# Patient Record
Sex: Female | Born: 1945 | Race: White | Hispanic: No | Marital: Married
Health system: Southern US, Academic
[De-identification: ages and names within clinical notes are randomized; demographics above are authoritative.]

## PROBLEM LIST (undated history)

## (undated) DIAGNOSIS — K5790 Diverticulosis of intestine, part unspecified, without perforation or abscess without bleeding: Secondary | ICD-10-CM

## (undated) DIAGNOSIS — Z87442 Personal history of urinary calculi: Secondary | ICD-10-CM

## (undated) DIAGNOSIS — I209 Angina pectoris, unspecified: Secondary | ICD-10-CM

## (undated) DIAGNOSIS — F419 Anxiety disorder, unspecified: Secondary | ICD-10-CM

## (undated) DIAGNOSIS — I10 Essential (primary) hypertension: Secondary | ICD-10-CM

## (undated) DIAGNOSIS — Z87440 Personal history of urinary (tract) infections: Secondary | ICD-10-CM

## (undated) DIAGNOSIS — G610 Guillain-Barre syndrome: Secondary | ICD-10-CM

## (undated) DIAGNOSIS — D126 Benign neoplasm of colon, unspecified: Secondary | ICD-10-CM

## (undated) DIAGNOSIS — K589 Irritable bowel syndrome without diarrhea: Secondary | ICD-10-CM

## (undated) DIAGNOSIS — M1990 Unspecified osteoarthritis, unspecified site: Secondary | ICD-10-CM

## (undated) DIAGNOSIS — E785 Hyperlipidemia, unspecified: Secondary | ICD-10-CM

## (undated) DIAGNOSIS — R7611 Nonspecific reaction to tuberculin skin test without active tuberculosis: Secondary | ICD-10-CM

## (undated) DIAGNOSIS — L409 Psoriasis, unspecified: Secondary | ICD-10-CM

## (undated) HISTORY — DX: Diverticulosis of intestine, part unspecified, without perforation or abscess without bleeding: K57.90

## (undated) HISTORY — DX: Unspecified osteoarthritis, unspecified site: M19.90

## (undated) HISTORY — PX: ABDOMINAL HYSTERECTOMY: SUR658

## (undated) HISTORY — PX: ABDOMINAL HYSTERECTOMY: SHX81

## (undated) HISTORY — DX: Psoriasis, unspecified: L40.9

## (undated) HISTORY — DX: Personal history of urinary calculi: Z87.442

## (undated) HISTORY — DX: Irritable bowel syndrome, unspecified: K58.9

## (undated) HISTORY — DX: Benign neoplasm of colon, unspecified: D12.6

## (undated) HISTORY — DX: Essential (primary) hypertension: I10

## (undated) HISTORY — PX: CATARACT EXTRACTION: SUR2

## (undated) HISTORY — PX: APPENDECTOMY: SHX54

## (undated) HISTORY — DX: Personal history of urinary (tract) infections: Z87.440

## (undated) HISTORY — PX: CHOLECYSTECTOMY: SHX55

## (undated) HISTORY — DX: Hyperlipidemia, unspecified: E78.5

## (undated) HISTORY — DX: Nonspecific reaction to tuberculin skin test without active tuberculosis: R76.11

## (undated) HISTORY — PX: EYE SURGERY: SHX253

## (undated) HISTORY — PX: SPINE SURGERY: SHX786

## (undated) HISTORY — DX: Guillain-Barre syndrome: G61.0

---
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
## 1988-11-18 HISTORY — PX: BREAST BIOPSY: SHX20

## 2005-08-18 HISTORY — PX: ROTATOR CUFF REPAIR: SHX139

## 2005-12-02 ENCOUNTER — Inpatient Hospital Stay: Payer: Self-pay | Admitting: Internal Medicine

## 2005-12-02 ENCOUNTER — Other Ambulatory Visit: Payer: Self-pay

## 2006-02-25 ENCOUNTER — Ambulatory Visit: Payer: Self-pay | Admitting: Internal Medicine

## 2006-08-10 ENCOUNTER — Emergency Department: Payer: Self-pay | Admitting: Emergency Medicine

## 2007-03-31 ENCOUNTER — Ambulatory Visit: Payer: Self-pay | Admitting: Internal Medicine

## 2007-04-10 ENCOUNTER — Ambulatory Visit: Payer: Self-pay | Admitting: Urology

## 2007-05-13 ENCOUNTER — Ambulatory Visit: Payer: Self-pay | Admitting: Urology

## 2007-05-19 ENCOUNTER — Other Ambulatory Visit: Payer: Self-pay

## 2007-05-19 ENCOUNTER — Ambulatory Visit: Payer: Self-pay | Admitting: Urology

## 2007-06-04 ENCOUNTER — Ambulatory Visit: Payer: Self-pay | Admitting: Urology

## 2007-06-15 ENCOUNTER — Ambulatory Visit: Payer: Self-pay | Admitting: Urology

## 2007-08-17 ENCOUNTER — Ambulatory Visit: Payer: Self-pay | Admitting: Gastroenterology

## 2007-09-07 ENCOUNTER — Ambulatory Visit: Payer: Self-pay | Admitting: Urology

## 2007-09-29 ENCOUNTER — Ambulatory Visit: Payer: Self-pay | Admitting: Urology

## 2008-01-07 ENCOUNTER — Ambulatory Visit: Payer: Self-pay | Admitting: Urology

## 2008-03-20 LAB — HM PAP SMEAR: HM Pap smear: NORMAL

## 2008-04-05 ENCOUNTER — Ambulatory Visit: Payer: Self-pay | Admitting: Internal Medicine

## 2008-04-15 ENCOUNTER — Ambulatory Visit: Payer: Self-pay | Admitting: Urology

## 2008-07-04 ENCOUNTER — Ambulatory Visit: Payer: Self-pay | Admitting: Urology

## 2008-08-17 ENCOUNTER — Ambulatory Visit: Payer: Self-pay | Admitting: Internal Medicine

## 2008-08-30 ENCOUNTER — Ambulatory Visit: Payer: Self-pay | Admitting: General Surgery

## 2008-09-01 ENCOUNTER — Ambulatory Visit: Payer: Self-pay | Admitting: General Surgery

## 2009-01-09 ENCOUNTER — Ambulatory Visit: Payer: Self-pay | Admitting: Urology

## 2009-05-04 ENCOUNTER — Ambulatory Visit: Payer: Self-pay | Admitting: Internal Medicine

## 2009-07-10 ENCOUNTER — Ambulatory Visit: Payer: Self-pay | Admitting: Urology

## 2009-10-18 ENCOUNTER — Ambulatory Visit: Payer: Self-pay | Admitting: Urology

## 2010-03-20 LAB — HM DIABETES EYE EXAM

## 2010-04-20 ENCOUNTER — Ambulatory Visit: Payer: Self-pay | Admitting: Urology

## 2010-05-15 ENCOUNTER — Ambulatory Visit: Payer: Self-pay | Admitting: Internal Medicine

## 2010-09-05 ENCOUNTER — Ambulatory Visit: Payer: Self-pay | Admitting: Internal Medicine

## 2011-01-23 ENCOUNTER — Ambulatory Visit: Payer: Self-pay | Admitting: Urology

## 2011-02-06 ENCOUNTER — Encounter: Payer: Self-pay | Admitting: Internal Medicine

## 2011-02-12 ENCOUNTER — Ambulatory Visit: Payer: Self-pay | Admitting: Internal Medicine

## 2011-02-17 ENCOUNTER — Ambulatory Visit: Payer: Self-pay | Admitting: Internal Medicine

## 2011-02-17 ENCOUNTER — Encounter: Payer: Self-pay | Admitting: Internal Medicine

## 2011-03-19 ENCOUNTER — Ambulatory Visit: Payer: Self-pay | Admitting: Internal Medicine

## 2011-07-12 ENCOUNTER — Ambulatory Visit (INDEPENDENT_AMBULATORY_CARE_PROVIDER_SITE_OTHER): Payer: Medicare Other | Admitting: Internal Medicine

## 2011-07-12 DIAGNOSIS — D649 Anemia, unspecified: Secondary | ICD-10-CM

## 2011-07-12 MED ORDER — CYANOCOBALAMIN 1000 MCG/ML IJ SOLN
1000.0000 ug | Freq: Once | INTRAMUSCULAR | Status: AC
  Administered 2011-07-12: 1000 ug via INTRAMUSCULAR

## 2011-07-12 NOTE — Progress Notes (Signed)
Pt. Was given B-12 per/Dr. Tilman Neat ok.

## 2011-07-18 ENCOUNTER — Telehealth: Payer: Self-pay | Admitting: Internal Medicine

## 2011-07-19 ENCOUNTER — Encounter: Payer: Self-pay | Admitting: Internal Medicine

## 2011-07-19 ENCOUNTER — Ambulatory Visit (INDEPENDENT_AMBULATORY_CARE_PROVIDER_SITE_OTHER): Payer: Medicare Other | Admitting: Internal Medicine

## 2011-07-19 VITALS — BP 134/76 | HR 90 | Temp 98.0°F | Resp 14 | Ht 64.0 in | Wt 174.0 lb

## 2011-07-19 DIAGNOSIS — K5792 Diverticulitis of intestine, part unspecified, without perforation or abscess without bleeding: Secondary | ICD-10-CM

## 2011-07-19 DIAGNOSIS — G589 Mononeuropathy, unspecified: Secondary | ICD-10-CM

## 2011-07-19 DIAGNOSIS — G629 Polyneuropathy, unspecified: Secondary | ICD-10-CM | POA: Insufficient documentation

## 2011-07-19 DIAGNOSIS — K5732 Diverticulitis of large intestine without perforation or abscess without bleeding: Secondary | ICD-10-CM

## 2011-07-19 DIAGNOSIS — E785 Hyperlipidemia, unspecified: Secondary | ICD-10-CM

## 2011-07-19 DIAGNOSIS — E119 Type 2 diabetes mellitus without complications: Secondary | ICD-10-CM | POA: Insufficient documentation

## 2011-07-19 DIAGNOSIS — Z0000 Encounter for general adult medical examination without abnormal findings: Secondary | ICD-10-CM

## 2011-07-19 MED ORDER — CIPROFLOXACIN HCL 500 MG PO TABS: 500.0000 mg | ORAL_TABLET | Freq: Two times a day (BID) | ORAL | Status: AC

## 2011-07-19 MED ORDER — GABAPENTIN 300 MG PO CAPS: 600.0000 mg | ORAL_CAPSULE | Freq: Every day | ORAL | Status: DC

## 2011-07-19 MED ORDER — METRONIDAZOLE 500 MG PO TABS: 500.0000 mg | ORAL_TABLET | Freq: Three times a day (TID) | ORAL | Status: AC

## 2011-07-19 MED ORDER — HYDROCODONE-ACETAMINOPHEN 5-325 MG PO TABS: 1.0000 | ORAL_TABLET | ORAL | Status: AC | PRN

## 2011-07-19 NOTE — Patient Instructions (Signed)
Diverticulitis A diverticulum is a small pouch or sac on the colon. Diverticulosis is the presence of these diverticula on the colon. Diverticulitis is the irritation (inflammation) or infection of diverticula. CAUSES The colon and its diverticula contain germs (bacteria). If food particles block the tiny opening to a diverticulum, the bacteria inside can grow and cause an increase in pressure. This leads to infection and inflammation. SYMPTOMS  Belly (abdominal) pain and tenderness. Usually, the pain is located on the left side of your abdomen. However, it could be located elsewhere.   Fever.   Bloating.   Feeling sick to your stomach (nausea).   Throwing up (vomiting).   Abnormal stools.  DIAGNOSIS Your caregiver will take a history and perform a physical exam. Since many things can cause abdominal pain, other tests may be necessary. Tests may include:  Blood tests.  Urine tests.   X-ray of the abdomen.  CT scan of the abdomen.   Sometimes, surgery is needed to determine if diverticulitis or other conditions are causing your symptoms. TREATMENT Most of the time, you can be treated without surgery. Treatment includes:  Resting the bowels by only having liquids for a few days.   Intravenous (IV) fluids if you are losing fluids (dehydrated).   Medicines (antibiotics) that kill germs may be given.   Pain and nausea medicine, if needed.   As you improve, eating soft, easily digestible foods.   Surgery if the inflamed diverticulum has burst.  HOME CARE INSTRUCTIONS  Take all medicine as directed by your caregiver.   Try a clear liquid diet (broth, tea, or water, or as directed by your caregiver). You may then gradually begin a bland diet as tolerated.   You may be put on a high-fiber diet. Avoid nuts and seeds. Follow your caregiver's diet recommendations.   If your caregiver has given you a follow-up appointment, it is very important that you go. Not going could result  in lasting (chronic) or permanent injury, pain, and disability. If there is any problem keeping the appointment, call to reschedule.  SEEK MEDICAL CARE IF:  Pain does not improve.   You have a hard time advancing your diet beyond clear liquids.   Bowel movements do not return to normal.  SEEK IMMEDIATE MEDICAL CARE IF:  The pain becomes worse.   You have an oral temperature above 101.5, not controlled by medicine.   Repeated vomiting occurs.   You have bloody or black, tarry stools.   Symptoms that brought you to your caregiver become worse or are not getting better.  MAKE SURE YOU:  Understand these instructions.   Will watch your condition.   Will get help right away if you are not doing well or get worse.  Document Released: 08/14/2005 Document Re-Released: 01/29/2010 [facility] Patient Information 2011 [place], [place].

## 2011-07-19 NOTE — Progress Notes (Signed)
Subjective:    Patient ID: Caroline Gray, female    DOB: 01/05/1946, 65 y.o.   MRN: [id number]  Abdominal Pain This is a new problem. The current episode started yesterday. The onset quality is sudden. The problem occurs constantly. The problem has been gradually improving. The pain is located in the LLQ. The pain is severe. The quality of the pain is aching and burning. The abdominal pain does not radiate. Associated symptoms include nausea. Pertinent negatives include no belching, constipation, diarrhea, dysuria, fever, frequency, hematochezia, melena or vomiting. The pain is aggravated by nothing. The pain is relieved by nothing. Treatments tried: ibuprofen.  Patient with h/o diverticultis.   Outpatient Encounter Prescriptions as of 07/19/2011  Medication Sig Dispense Refill  . adalimumab (HUMIRA) 40 MG/0.8ML injection Inject 40 mg into the skin once a week.        . gabapentin (NEURONTIN) 300 MG capsule Take 2 capsules (600 mg total) by mouth daily.  60 capsule  11  . DISCONTD: gabapentin (NEURONTIN) 100 MG capsule Take 100 mg by mouth daily.        . ciprofloxacin (CIPRO) 500 MG tablet Take 1 tablet (500 mg total) by mouth 2 (two) times daily.  28 tablet  0  . glipiZIDE (GLUCOTROL XL) 2.5 MG 24 hr tablet       . HYDROcodone-acetaminophen (NORCO) 5-325 MG per tablet Take 1 tablet by mouth every 4 (four) hours as needed for pain.  30 tablet  0  . metFORMIN (GLUCOPHAGE) 1000 MG tablet       . metroNIDAZOLE (FLAGYL) 500 MG tablet Take 1 tablet (500 mg total) by mouth 3 (three) times daily.  42 tablet  0  . ONE TOUCH ULTRA TEST test strip       . simvastatin (ZOCOR) 40 MG tablet         Review of Systems  Constitutional: Positive for chills, appetite change and fatigue. Negative for fever.  Respiratory: Negative for shortness of breath.   Cardiovascular: Negative for chest pain and leg swelling.  Gastrointestinal: Positive for nausea, abdominal pain and abdominal distention. Negative for  vomiting, diarrhea, constipation, blood in stool, melena, hematochezia, anal bleeding and rectal pain.  Genitourinary: Negative for dysuria and frequency.    BP 134/76  Pulse 90  Temp(Src) 98 F (36.7 C) (Oral)  Resp 14  Ht 5\' 4"  (1.626 m)  Wt 174 lb (78.926 kg)  BMI 29.87 kg/m2  SpO2 93%     Objective:   Physical Exam  Constitutional: She is oriented to person, place, and time. She appears well-developed and well-nourished. No distress.  HENT:  Head: Normocephalic and atraumatic.  Right Ear: External ear normal.  Left Ear: External ear normal.  Nose: Nose normal.  Eyes: Conjunctivae are normal. Pupils are equal, round, and reactive to light. Right eye exhibits no discharge. Left eye exhibits no discharge. No scleral icterus.  Neck: Trachea normal and normal range of motion. Neck supple. Carotid bruit is not present. No tracheal deviation present. No thyromegaly present.  Cardiovascular: Normal rate, regular rhythm, normal heart sounds, intact distal pulses and normal pulses.  Exam reveals no gallop and no friction rub.   No murmur heard. Pulmonary/Chest: Effort normal and breath sounds normal. No respiratory distress. She has no wheezes. She has no rales. She exhibits no tenderness.  Abdominal: Soft. Normal appearance and bowel sounds are normal. There is no hepatosplenomegaly. There is tenderness in the left lower quadrant. There is no rigidity, no rebound and no guarding.  Musculoskeletal: Normal range of motion. She exhibits no edema and no tenderness.  Lymphadenopathy:    She has no cervical adenopathy.  Neurological: She is alert and oriented to person, place, and time. No cranial nerve deficit. She exhibits normal muscle tone. Coordination normal.  Skin: Skin is warm and dry. No rash noted. She is not diaphoretic. No erythema. No pallor.  Psychiatric: She has a normal mood and affect. Her behavior is normal. Judgment and thought content normal.          Assessment &  Plan:  1. Diverticulitis - patient with a history of diverticulitis.Presents with symptoms of left lower quadrant pain which are consistent with previous episodes.  Denies any hematochezia. Will treat empirically with Cipro and Flagyl x2 weeks. She has been taking ibuprofen for pain and I am reluctant for her to continue this because of the risk of upper GI bleeding. We will try hydrocodone. She has had an allergy to codeine in the past with nausea. However, she reports tolerating hydrocodone. Should she have any problems with hydrocodone she will call our office and I will consider a low dose of Dilaudid. Should her pain not improve over the next few days or should her pain worsen or she develops new symptoms she will return to clinic or call immediately. Otherwise she will return to clinic for her yearly physical exam next month.

## 2011-08-09 ENCOUNTER — Telehealth: Payer: Self-pay | Admitting: *Deleted

## 2011-08-09 ENCOUNTER — Other Ambulatory Visit (INDEPENDENT_AMBULATORY_CARE_PROVIDER_SITE_OTHER): Payer: Medicare Other | Admitting: *Deleted

## 2011-08-09 ENCOUNTER — Encounter: Payer: Self-pay | Admitting: Internal Medicine

## 2011-08-09 DIAGNOSIS — E119 Type 2 diabetes mellitus without complications: Secondary | ICD-10-CM

## 2011-08-09 DIAGNOSIS — G589 Mononeuropathy, unspecified: Secondary | ICD-10-CM

## 2011-08-09 DIAGNOSIS — G629 Polyneuropathy, unspecified: Secondary | ICD-10-CM

## 2011-08-09 DIAGNOSIS — E785 Hyperlipidemia, unspecified: Secondary | ICD-10-CM

## 2011-08-09 LAB — MICROALBUMIN / CREATININE URINE RATIO: Microalb Creat Ratio: 12.7 mg/g (ref 0.0–30.0)

## 2011-08-09 LAB — LIPID PANEL
Cholesterol: 174 mg/dL (ref 0–200)
HDL: 40.2 mg/dL (ref >39.00)
LDL Cholesterol: 98 mg/dL (ref 0–99)
Triglycerides: 181 mg/dL — ABNORMAL HIGH (ref 0.0–149.0)
VLDL: 36.2 mg/dL (ref 0.0–40.0)

## 2011-08-09 LAB — COMPREHENSIVE METABOLIC PANEL
AST: 29 U/L (ref 0–37)
Alkaline Phosphatase: 59 U/L (ref 39–117)
BUN: 15 mg/dL (ref 6–23)
Creatinine, Ser: 0.9 mg/dL (ref 0.4–1.2)
Glucose, Bld: 269 mg/dL — ABNORMAL HIGH (ref 70–99)
Total Bilirubin: 0.5 mg/dL (ref 0.3–1.2)

## 2011-08-09 NOTE — Progress Notes (Signed)
Addended by: Jobie Quaker on: 08/09/2011 02:03 PM   Modules accepted: Orders

## 2011-08-09 NOTE — Telephone Encounter (Signed)
Opened in error

## 2011-08-12 ENCOUNTER — Ambulatory Visit: Payer: BC Managed Care – PPO

## 2011-08-14 ENCOUNTER — Ambulatory Visit: Payer: BC Managed Care – PPO

## 2011-08-14 ENCOUNTER — Encounter: Payer: Self-pay | Admitting: Internal Medicine

## 2011-08-14 ENCOUNTER — Ambulatory Visit (INDEPENDENT_AMBULATORY_CARE_PROVIDER_SITE_OTHER): Payer: Medicare Other | Admitting: Internal Medicine

## 2011-08-14 DIAGNOSIS — E119 Type 2 diabetes mellitus without complications: Secondary | ICD-10-CM

## 2011-08-14 DIAGNOSIS — E538 Deficiency of other specified B group vitamins: Secondary | ICD-10-CM

## 2011-08-14 DIAGNOSIS — E785 Hyperlipidemia, unspecified: Secondary | ICD-10-CM

## 2011-08-14 LAB — GLUCOSE, POCT (MANUAL RESULT ENTRY): POC Glucose: 243

## 2011-08-14 MED ORDER — GLIPIZIDE ER 2.5 MG PO TB24: 5.0000 mg | ORAL_TABLET | Freq: Every day | ORAL | Status: DC

## 2011-08-14 MED ORDER — GLUCOSE BLOOD VI STRP: ORAL_STRIP | Status: DC

## 2011-08-14 MED ORDER — CYANOCOBALAMIN 1000 MCG/ML IJ SOLN
1000.0000 ug | Freq: Once | INTRAMUSCULAR | Status: AC
  Administered 2011-08-14: 1000 ug via INTRAMUSCULAR

## 2011-08-14 NOTE — Patient Instructions (Signed)
Record blood sugars twice daily. Return in 1 month.

## 2011-08-14 NOTE — Progress Notes (Signed)
Subjective:    Patient ID: Caroline Gray, female    DOB: [birth year], 65 y.o.   MRN: [id number]  HPI Caroline Gray is a 65 year old female with a history of diabetes, hyperlipidemia, and psoriasis who presents for followup. In regards to her diabetes she reports recently elevated blood sugars, often over 200 fasting. She notes a recent dietary indiscretions secondary to often eating on the run as she attempts to take care of her husband who has been ill. She denies any low blood sugars. She reports full compliance with her metformin and glipizide. Recent hemoglobin A1c on lab work was 7.4%. Recent fasting blood work him up work was 269.  Caroline Gray also notes recent worsening of the psoriasis on her hands and feet. She is followed by dermatologist at [facility]. She is currently taking both Humira and methotrexate. She reports significant fatigue after taking her methotrexate. She has followup with her dermatologist to assess her progress, however has been told that there are no other available treatments at this time.  In regards to her hyperlipidemia, Caroline Gray reports full compliance with her simvastatin. She denies any myalgia or other noted side effects from this medication. Review of her recent lab work shows an LDL cholesterol of 92, just above goal of less than 70.  Outpatient Encounter Prescriptions as of 08/14/2011  Medication Sig Dispense Refill  . adalimumab (HUMIRA) 40 MG/0.8ML injection Inject 40 mg into the skin once a week.        . Calcium Citrate-Vitamin D (CALCIUM CITRATE + D PO) Take 1 capsule by mouth 2 (two) times daily.        . folic acid (FOLVITE) 1 MG tablet Take 1 mg by mouth. Take only 5 days a week Tuesday through Saturday      . gabapentin (NEURONTIN) 300 MG capsule Take 2 capsules (600 mg total) by mouth daily.  60 capsule  11  . glucose blood (ONE TOUCH ULTRA TEST) test strip Check blood sugar twice daily  100 each  11  . LOCOID LIPOCREAM 0.1 % CREA       . metFORMIN (GLUCOPHAGE) 1000 MG  tablet Take 1,000 mg by mouth at bedtime.       . methotrexate (RHEUMATREX) 2.5 MG tablet Take 2.5 mg by mouth. Takes on Sunday 3 pills and 1 on Monday      . Multiple Vitamin (MULTIVITAMIN) capsule Take 1 capsule by mouth daily.        . urea (CARMOL) 40 % CREA       . DISCONTD: ONE TOUCH ULTRA TEST test strip       . glipiZIDE (GLUCOTROL XL) 2.5 MG 24 hr tablet Take 2 tablets (5 mg total) by mouth daily.  60 tablet  11  . simvastatin (ZOCOR) 40 MG tablet Take 40 mg by mouth at bedtime.          Review of Systems  Constitutional: Negative for fever, chills, appetite change, fatigue and unexpected weight change.  HENT: Negative for ear pain, congestion, sore throat, trouble swallowing, neck pain, voice change and sinus pressure.   Eyes: Negative for visual disturbance.  Respiratory: Negative for cough, shortness of breath, wheezing and stridor.   Cardiovascular: Negative for chest pain, palpitations and leg swelling.  Gastrointestinal: Negative for nausea, vomiting, abdominal pain, diarrhea, constipation, blood in stool, abdominal distention and anal bleeding.  Genitourinary: Negative for dysuria and flank pain.  Musculoskeletal: Negative for myalgias, arthralgias and gait problem.  Skin: Positive for rash. Negative for color  change.  Neurological: Negative for dizziness and headaches.  Hematological: Negative for adenopathy. Does not bruise/bleed easily.  Psychiatric/Behavioral: Positive for dysphoric mood. Negative for suicidal ideas and sleep disturbance. The patient is not nervous/anxious.    BP 151/84  Pulse 88  Temp(Src) 97.7 F (36.5 C) (Oral)  Resp 16  Ht 5\' 3"  (1.6 m)  Wt 167 lb (75.751 kg)  BMI 29.58 kg/m2  SpO2 97%     Objective:   Physical Exam  Constitutional: She is oriented to person, place, and time. She appears well-developed and well-nourished. No distress.  HENT:  Head: Normocephalic and atraumatic.  Right Ear: External ear normal.  Left Ear: External ear  normal.  Nose: Nose normal.  Mouth/Throat: Oropharynx is clear and moist. No oropharyngeal exudate.  Eyes: Conjunctivae are normal. Pupils are equal, round, and reactive to light. Right eye exhibits no discharge. Left eye exhibits no discharge. No scleral icterus.  Neck: Normal range of motion. Neck supple. No tracheal deviation present. No thyromegaly present.  Cardiovascular: Normal rate, regular rhythm, normal heart sounds and intact distal pulses.  Exam reveals no gallop and no friction rub.   No murmur heard. Pulmonary/Chest: Effort normal and breath sounds normal. No respiratory distress. She has no wheezes. She has no rales. She exhibits no tenderness.  Musculoskeletal: Normal range of motion. She exhibits no edema and no tenderness.  Lymphadenopathy:    She has no cervical adenopathy.  Neurological: She is alert and oriented to person, place, and time. No cranial nerve deficit. She exhibits normal muscle tone. Coordination normal.  Skin: Skin is warm and dry. Rash noted. She is not diaphoretic. No erythema. No pallor.          Erythematous, peeling rash over palms and soles  Psychiatric: She has a normal mood and affect. Her behavior is normal. Judgment and thought content normal.          Assessment & Plan:  1. Diabetes - blood sugar has been elevated recently in the setting of dietary indiscretions. Encouraged healthy diet. Patient is reluctant to start injectable medications. We'll try increasing glipizide to 5 mg daily. We'll have her record her blood sugars twice daily return to clinic in one month.  2. Psoriasis - patient with severe psoriasis which has been resistant to treatment with Humira and methotrexate. She has followup scheduled at [facility] and will keep this. Will continue to follow.  3. Hyperlipidemia - LDL cholesterol just above goal at 92. We discussed goal LDL cholesterol being less than 70. We will plan to repeat cholesterol labs in 3-6 months. She will continue  simvastatin.  4. B12 def - B12 injection today.

## 2011-09-13 ENCOUNTER — Encounter: Payer: Self-pay | Admitting: Internal Medicine

## 2011-09-13 ENCOUNTER — Ambulatory Visit (INDEPENDENT_AMBULATORY_CARE_PROVIDER_SITE_OTHER): Payer: Medicare Other | Admitting: Internal Medicine

## 2011-09-13 VITALS — BP 131/82 | HR 75 | Temp 97.8°F | Resp 16 | Ht 63.0 in | Wt 172.0 lb

## 2011-09-13 DIAGNOSIS — R5383 Other fatigue: Secondary | ICD-10-CM

## 2011-09-13 DIAGNOSIS — E119 Type 2 diabetes mellitus without complications: Secondary | ICD-10-CM

## 2011-09-13 DIAGNOSIS — L409 Psoriasis, unspecified: Secondary | ICD-10-CM

## 2011-09-13 DIAGNOSIS — L408 Other psoriasis: Secondary | ICD-10-CM

## 2011-09-13 DIAGNOSIS — R5381 Other malaise: Secondary | ICD-10-CM

## 2011-09-13 DIAGNOSIS — Z0000 Encounter for general adult medical examination without abnormal findings: Secondary | ICD-10-CM

## 2011-09-13 MED ORDER — CYANOCOBALAMIN 1000 MCG/ML IJ SOLN
1000.0000 ug | Freq: Once | INTRAMUSCULAR | Status: AC
  Administered 2011-09-13: 1000 ug via INTRAMUSCULAR

## 2011-09-13 NOTE — Patient Instructions (Signed)
You are doing well. Continue current medications. Continue with walking and weight lifting. Follow up with labs and visit in 10/2011.

## 2011-09-13 NOTE — Progress Notes (Signed)
Addended by: Virgina Evener on: 09/13/2011 05:54 PM   Modules accepted: Orders

## 2011-09-13 NOTE — Progress Notes (Signed)
Subjective:    Caroline Gray is a 65 y.o. female who presents for Medicare Annual/Subsequent preventive examination. She reports that she has been feeling well. She brings a record of her blood sugars today which show most blood sugars between 70 and 150 fasting. She does have a few outlier sugars near 200. She reports that these elevated sugars occurred when she was traveling with friends. Over the last couple of weeks she has made significant effort to improve her diet and has been exercising on a regular basis by walking and lifting weights. She has noticed a gradual decline in her blood sugars. She continues to take glipizide and metformin. She denies any complications or side effects from these medications. She denies any symptomatic low blood sugars.  She notes that since July 2012 she has had significant worsening of her psoriasis. She has been followed closely by her dermatologist and is taking methotrexate. She also receives Humira once weekly. She was having significant abdominal pain with methotrexate and recently leucovorin was added. She reports some improvement with this. She continues to have peeling she continues to have peeling over both of her hands and her feet. She attributes the worsening in her psoriasis to increased stress associated with dealing with her husband's recent illness.  Preventive Screening-Counseling & Management  Tobacco History  Smoking status  . Never Smoker   Smokeless tobacco  . Never Used     Current Problems (verified) Patient Active Problem List  Diagnoses  . Hyperlipidemia  . Diabetes mellitus  . Neuropathy  . B12 deficiency    Medications Prior to Visit Current Outpatient Prescriptions on File Prior to Visit  Medication Sig Dispense Refill  . adalimumab (HUMIRA) 40 MG/0.8ML injection Inject 40 mg into the skin once a week.        . Calcium Citrate-Vitamin D (CALCIUM CITRATE + D PO) Take 1 capsule by mouth 2 (two) times daily.        Marland Kitchen gabapentin  (NEURONTIN) 300 MG capsule Take 2 capsules (600 mg total) by mouth daily.  60 capsule  11  . glipiZIDE (GLUCOTROL XL) 2.5 MG 24 hr tablet Take 2 tablets (5 mg total) by mouth daily.  60 tablet  11  . glucose blood (ONE TOUCH ULTRA TEST) test strip Check blood sugar twice daily  100 each  11  . LOCOID LIPOCREAM 0.1 % CREA       . metFORMIN (GLUCOPHAGE) 1000 MG tablet Take 1,000 mg by mouth at bedtime.       . methotrexate (RHEUMATREX) 2.5 MG tablet Take 2.5 mg by mouth. Takes on Sunday 3 pills and 1 on Monday      . Multiple Vitamin (MULTIVITAMIN) capsule Take 1 capsule by mouth daily.        . simvastatin (ZOCOR) 40 MG tablet Take 40 mg by mouth at bedtime.       . urea (CARMOL) 40 % CREA         Current Medications (verified) Current Outpatient Prescriptions  Medication Sig Dispense Refill  . adalimumab (HUMIRA) 40 MG/0.8ML injection Inject 40 mg into the skin once a week.        . Calcium Citrate-Vitamin D (CALCIUM CITRATE + D PO) Take 1 capsule by mouth 2 (two) times daily.        . clobetasol (TEMOVATE) 0.05 % cream Apply 1 application topically 2 (two) times daily.        Marland Kitchen gabapentin (NEURONTIN) 300 MG capsule Take 2 capsules (600 mg total)  by mouth daily.  60 capsule  11  . glipiZIDE (GLUCOTROL XL) 2.5 MG 24 hr tablet Take 2 tablets (5 mg total) by mouth daily.  60 tablet  11  . glucose blood (ONE TOUCH ULTRA TEST) test strip Check blood sugar twice daily  100 each  11  . leucovorin (WELLCOVORIN) 5 MG tablet Take 5 mg by mouth once a week.        Marland Kitchen LOCOID LIPOCREAM 0.1 % CREA       . metFORMIN (GLUCOPHAGE) 1000 MG tablet Take 1,000 mg by mouth at bedtime.       . methotrexate (RHEUMATREX) 2.5 MG tablet Take 2.5 mg by mouth. Takes on Sunday 3 pills and 1 on Monday      . Multiple Vitamin (MULTIVITAMIN) capsule Take 1 capsule by mouth daily.        . simvastatin (ZOCOR) 40 MG tablet Take 40 mg by mouth at bedtime.       . urea (CARMOL) 40 % CREA          Allergies  (verified) Codeine; Ivp dye; and Macrodantin   PAST HISTORY  Family History Family History  Problem Relation Age of Onset  . Diabetes Mother     Social History History  Substance Use Topics  . Smoking status: Never Smoker   . Smokeless tobacco: Never Used  . Alcohol Use: Yes     occassionaly     Are there smokers in your home (other than you)? No  Risk Factors Current exercise habits: walks regularly and lifts weights 2 days per week  Dietary issues discussed: Healthy in general   Cardiac risk factors: diabetes mellitus and dyslipidemia.  Depression Screen (Note: if answer to either of the following is "Yes", a more complete depression screening is indicated)   Over the past two weeks, have you felt down, depressed or hopeless? Yes, down occasionally  Over the past two weeks, have you felt little interest or pleasure in doing things? No  Have you lost interest or pleasure in daily life? No  Do you often feel hopeless? No  Do you cry easily over simple problems? No  Activities of Daily Living In your present state of health, do you have any difficulty performing the following activities?:  Driving? No Managing money?  No Feeding yourself? No Getting from bed to chair? No Climbing a flight of stairs? No Preparing food and eating?: No Bathing or showering? No Getting dressed: No Getting to the toilet? No Using the toilet:No Moving around from place to place: No In the past year have you fallen or had a near fall?:No   Are you sexually active?  Yes  Do you have more than one partner?  No  Hearing Difficulties: No Do you often ask people to speak up or repeat themselves? No Do you experience ringing or noises in your ears? Yes, brother also experiences this Do you have difficulty understanding soft or whispered voices? No   Do you feel that you have a problem with memory? No  Do you often misplace items? No  Do you feel safe at home?  Yes  Cognitive  Testing  Alert? Yes  Normal Appearance?Yes  Oriented to person? Yes  Place? Yes   Time? Yes  Recall of three objects?  Yes  Can perform simple calculations? Yes  Displays appropriate judgment?Yes  Can read the correct time from a watch face?Yes   Advanced Directives have been discussed with the patient? Yes, living will and  HCPOA in place. With lawyer Jenne Pane in [place].  List the Names of Other Physician/Practitioners you currently use: 1.  Dr. Westley Foots, Derm 2.  Dr. Achilles Dunk, Urology    There is no immunization history on file for this patient.  Screening Tests Health Maintenance  Topic Date Due  . Tetanus/tdap  02/21/1963  . Zostavax  02/21/2004  . Pneumococcal Polysaccharide Vaccine Age 97 And Over  02/20/2009  . Influenza Vaccine  08/19/2011  . Mammogram  07/18/2013  . Colonoscopy  07/18/2018    All answers were reviewed with the patient and necessary referrals were made:  Shelia Media, MD   09/13/2011   History reviewed: allergies, current medications, past family history, past medical history, past social history, past surgical history and problem list  Review of Systems Pertinent items are noted in HPI.    Objective:      Body mass index is 30.47 kg/(m^2). BP 131/82  Pulse 75  Temp(Src) 97.8 F (36.6 C) (Oral)  Resp 16  Ht 5\' 3"  (1.6 m)  Wt 172 lb (78.019 kg)  BMI 30.47 kg/m2  SpO2 97%  BP 131/82  Pulse 75  Temp(Src) 97.8 F (36.6 C) (Oral)  Resp 16  Ht 5\' 3"  (1.6 m)  Wt 172 lb (78.019 kg)  BMI 30.47 kg/m2  SpO2 97%  General Appearance:    Alert, cooperative, no distress, appears stated age  Head:    Normocephalic, without obvious abnormality, atraumatic  Eyes:    PERRL, conjunctiva/corneas clear, EOM's intact, fundi    benign, both eyes  Ears:    Normal TM's and external ear canals, both ears  Nose:   Nares normal, septum midline, mucosa normal, no drainage    or sinus tenderness  Throat:   Lips, mucosa, and tongue normal; teeth and gums  normal  Neck:   Supple, symmetrical, trachea midline, no adenopathy;    thyroid:  no enlargement/tenderness/nodules; no carotid   bruit or JVD  Back:     Symmetric, no curvature, ROM normal, no CVA tenderness  Lungs:     Clear to auscultation bilaterally, respirations unlabored  Chest Wall:    No tenderness or deformity   Heart:    Regular rate and rhythm, S1 and S2 normal, no murmur, rub   or gallop  Breast Exam:    No tenderness, masses, or nipple abnormality  Abdomen:     Soft, non-tender, bowel sounds active all four quadrants,    no masses, no organomegaly  Extremities:   Extremities normal, atraumatic, no cyanosis or edema  Pulses:   2+ and symmetric all extremities  Skin:   Peeling over palmar surfaces of hands and plantar surface of feet  Lymph nodes:   Cervical, supraclavicular, and axillary nodes normal  Neurologic:   CNII-XII intact, normal strength, sensation and reflexes    throughout       Assessment:     1. General Exam - patients general exam today was normal. We discussed the pros and cons of continuing with Pap smears and she has decided not to have additional Pap smears. She reports that all Pap smears in the past have been normal. She is scheduled for a mammogram. She is up-to-date on her colonoscopy. She is up-to-date on lab work including CBC, CMP, and lipid profile. Encourage continued healthy diet and regular exercise with walking and lifting weights. 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  2. Diabetes Mellitus - blood sugars have improved over the  last couple of weeks with improved diet and increased physical activity. Encouraged her to continue with this. She will continue to record her blood sugars. She will have her hemoglobin A1c rechecked in December 2012. She will followup at that time.  3. Psoriasis -recently worsened in the setting of increased stress. She notes improvement in side effects from methotrexate with the use of  leucovorin. She will continue to follow with dermatology.  4. Pernicious anemia - h/o B12 deficiency. Will give B12 shot today.      Diet review for nutrition referral?  Not Indicated    Patient Instructions (the written plan) was given to the patient.  Medicare Attestation I have personally reviewed: The patient's medical and social history Their use of alcohol, tobacco or illicit drugs Their current medications and supplements The patient's functional ability including ADLs,fall risks, home safety risks, cognitive, and hearing and visual impairment Diet and physical activities Evidence for depression or mood disorders  The patient's weight, height, BMI, and visual acuity have been recorded in the chart.  I have made referrals, counseling, and provided education to the patient based on review of the above and I have provided the patient with a written personalized care plan for preventive services.     Shelia Media, MD   09/13/2011

## 2011-09-21 LAB — HM MAMMOGRAPHY: HM Mammogram: NORMAL

## 2011-09-23 ENCOUNTER — Ambulatory Visit: Payer: Self-pay | Admitting: Internal Medicine

## 2011-09-30 NOTE — Telephone Encounter (Signed)
This was a FYI.

## 2011-10-08 ENCOUNTER — Encounter: Payer: Self-pay | Admitting: Internal Medicine

## 2011-10-14 ENCOUNTER — Ambulatory Visit: Payer: Medicare Other | Admitting: *Deleted

## 2011-10-14 DIAGNOSIS — E538 Deficiency of other specified B group vitamins: Secondary | ICD-10-CM

## 2011-10-14 MED ORDER — CYANOCOBALAMIN 1000 MCG/ML IJ SOLN
1000.0000 ug | Freq: Once | INTRAMUSCULAR | Status: AC
  Administered 2011-10-14: 1000 ug via INTRAMUSCULAR

## 2011-11-21 ENCOUNTER — Other Ambulatory Visit (INDEPENDENT_AMBULATORY_CARE_PROVIDER_SITE_OTHER): Payer: Medicare Other | Admitting: *Deleted

## 2011-11-21 ENCOUNTER — Ambulatory Visit: Payer: Medicare Other

## 2011-11-21 DIAGNOSIS — E538 Deficiency of other specified B group vitamins: Secondary | ICD-10-CM

## 2011-11-21 DIAGNOSIS — E119 Type 2 diabetes mellitus without complications: Secondary | ICD-10-CM

## 2011-11-21 LAB — COMPREHENSIVE METABOLIC PANEL
Albumin: 3.9 g/dL (ref 3.5–5.2)
Alkaline Phosphatase: 59 U/L (ref 39–117)
BUN: 15 mg/dL (ref 6–23)
Calcium: 9.3 mg/dL (ref 8.4–10.5)
Creatinine, Ser: 0.9 mg/dL (ref 0.4–1.2)
Glucose, Bld: 135 mg/dL — ABNORMAL HIGH (ref 70–99)
Potassium: 3.9 mEq/L (ref 3.5–5.1)

## 2011-11-21 MED ORDER — CYANOCOBALAMIN 1000 MCG/ML IJ SOLN
1000.0000 ug | Freq: Once | INTRAMUSCULAR | Status: AC
Start: 2011-11-21 — End: 2011-11-21
  Administered 2011-11-21: 1000 ug via INTRAMUSCULAR

## 2011-11-26 ENCOUNTER — Ambulatory Visit: Payer: Medicare Other | Admitting: Internal Medicine

## 2011-12-03 ENCOUNTER — Encounter: Payer: Self-pay | Admitting: Internal Medicine

## 2011-12-03 ENCOUNTER — Ambulatory Visit (INDEPENDENT_AMBULATORY_CARE_PROVIDER_SITE_OTHER): Payer: Medicare Other | Admitting: Internal Medicine

## 2011-12-03 VITALS — BP 130/62 | Temp 97.7°F | Ht 63.0 in | Wt 167.0 lb

## 2011-12-03 DIAGNOSIS — E119 Type 2 diabetes mellitus without complications: Secondary | ICD-10-CM

## 2011-12-03 DIAGNOSIS — R1032 Left lower quadrant pain: Secondary | ICD-10-CM

## 2011-12-03 DIAGNOSIS — K5792 Diverticulitis of intestine, part unspecified, without perforation or abscess without bleeding: Secondary | ICD-10-CM

## 2011-12-03 DIAGNOSIS — N390 Urinary tract infection, site not specified: Secondary | ICD-10-CM

## 2011-12-03 LAB — POCT URINALYSIS DIPSTICK
Glucose, UA: NEGATIVE
Nitrite, UA: NEGATIVE
Protein, UA: 100
Urobilinogen, UA: 0.2

## 2011-12-03 MED ORDER — HYDROCODONE-ACETAMINOPHEN 5-500 MG PO TABS: 1.0000 | ORAL_TABLET | Freq: Four times a day (QID) | ORAL | Status: AC | PRN

## 2011-12-03 MED ORDER — CIPROFLOXACIN HCL 500 MG PO TABS: 500.0000 mg | ORAL_TABLET | Freq: Two times a day (BID) | ORAL | Status: AC

## 2011-12-03 MED ORDER — METRONIDAZOLE 500 MG PO TABS: 500.0000 mg | ORAL_TABLET | Freq: Three times a day (TID) | ORAL | Status: AC

## 2011-12-03 NOTE — Progress Notes (Signed)
Addended by: Jobie Quaker on: 12/03/2011 04:44 PM   Modules accepted: Orders

## 2011-12-03 NOTE — Progress Notes (Signed)
Subjective:    Patient ID: Caroline Gray, female    DOB: 1945/11/23, 66 y.o.   MRN: [id number]  HPI 66 year old female with a history of diabetes, psoriasis, and diverticulitis presents for followup. In regards to her diabetes, her recent hemoglobin A1c was improved at 6.9. She brings record of her blood sugars today which show most blood sugars between 80 and 120 fasting. She has had a couple of low blood sugars below 75 during which she has been symptomatic. She has used sugar with resolution of her symptoms during these times. She denies any elevated blood sugars greater than 250. She reports full compliance with her medications.  Her primary concern today is a several week history of intermittent left lower quadrant pain. She has been treated in the past for diverticulitis with antibiotics with some improvement. However, her symptoms have recently recurred. She denies any fever or chills. She denies any diarrhea, blood in her stool, or change in her bowel habits. She denies any dysuria or hematuria.  Outpatient Encounter Prescriptions as of 12/03/2011  Medication Sig Dispense Refill  . adalimumab (HUMIRA) 40 MG/0.8ML injection Inject 40 mg into the skin once a week.        . Calcium Citrate-Vitamin D (CALCIUM CITRATE + D PO) Take 1 capsule by mouth 2 (two) times daily.        . clobetasol (TEMOVATE) 0.05 % cream Apply 1 application topically 2 (two) times daily.        Marland Kitchen gabapentin (NEURONTIN) 300 MG capsule Take 2 capsules (600 mg total) by mouth daily.  60 capsule  11  . glipiZIDE (GLUCOTROL XL) 2.5 MG 24 hr tablet Take 2 tablets (5 mg total) by mouth daily.  60 tablet  11  . glucose blood (ONE TOUCH ULTRA TEST) test strip Check blood sugar twice daily  100 each  11  . leucovorin (WELLCOVORIN) 5 MG tablet Take 5 mg by mouth once a week.        Marland Kitchen LOCOID LIPOCREAM 0.1 % CREA       . metFORMIN (GLUCOPHAGE) 1000 MG tablet Take 1,000 mg by mouth at bedtime.       . methotrexate (RHEUMATREX) 2.5 MG  tablet Take 2.5 mg by mouth. Takes on Sunday 3 pills and 1 on Monday      . Multiple Vitamin (MULTIVITAMIN) capsule Take 1 capsule by mouth daily.        . simvastatin (ZOCOR) 40 MG tablet Take 40 mg by mouth at bedtime.       . urea (CARMOL) 40 % CREA       . ciprofloxacin (CIPRO) 500 MG tablet Take 1 tablet (500 mg total) by mouth 2 (two) times daily.  28 tablet  0  . HYDROcodone-acetaminophen (VICODIN) 5-500 MG per tablet Take 1 tablet by mouth every 6 (six) hours as needed for pain.  60 tablet  0  . metroNIDAZOLE (FLAGYL) 500 MG tablet Take 1 tablet (500 mg total) by mouth 3 (three) times daily.  42 tablet  0    Review of Systems  Constitutional: Negative for fever, chills, appetite change, fatigue and unexpected weight change.  HENT: Negative for ear pain, congestion, sore throat, trouble swallowing, neck pain and sinus pressure.   Eyes: Negative for visual disturbance.  Respiratory: Negative for cough, shortness of breath, wheezing and stridor.   Cardiovascular: Negative for chest pain, palpitations and leg swelling.  Gastrointestinal: Positive for abdominal pain. Negative for nausea, vomiting, diarrhea, constipation, blood in stool, abdominal distention  and anal bleeding.  Genitourinary: Negative for dysuria and flank pain.  Musculoskeletal: Negative for myalgias, arthralgias and gait problem.  Skin: Positive for rash. Negative for color change.  Neurological: Negative for dizziness and headaches.  Hematological: Negative for adenopathy. Does not bruise/bleed easily.  Psychiatric/Behavioral: Negative for suicidal ideas, sleep disturbance and dysphoric mood. The patient is not nervous/anxious.    BP 130/62  Temp(Src) 97.7 F (36.5 C) (Oral)  Ht 5\' 3"  (1.6 m)  Wt 167 lb (75.751 kg)  BMI 29.58 kg/m2     Objective:   Physical Exam  Constitutional: She is oriented to person, place, and time. She appears well-developed and well-nourished. No distress.  HENT:  Head: Normocephalic  and atraumatic.  Right Ear: External ear normal.  Left Ear: External ear normal.  Nose: Nose normal.  Mouth/Throat: Oropharynx is clear and moist. No oropharyngeal exudate.  Eyes: Conjunctivae are normal. Pupils are equal, round, and reactive to light. Right eye exhibits no discharge. Left eye exhibits no discharge. No scleral icterus.  Neck: Normal range of motion. Neck supple. No tracheal deviation present. No thyromegaly present.  Cardiovascular: Normal rate, regular rhythm, normal heart sounds and intact distal pulses.  Exam reveals no gallop and no friction rub.   No murmur heard. Pulmonary/Chest: Effort normal and breath sounds normal. No respiratory distress. She has no wheezes. She has no rales. She exhibits no tenderness.  Abdominal: Soft. Bowel sounds are normal. She exhibits no distension and no mass. There is tenderness (left lower quadrant). There is no rebound and no guarding.  Musculoskeletal: Normal range of motion. She exhibits no edema and no tenderness.  Lymphadenopathy:    She has no cervical adenopathy.  Neurological: She is alert and oriented to person, place, and time. No cranial nerve deficit. She exhibits normal muscle tone. Coordination normal.  Skin: Skin is warm and dry. No rash noted. She is not diaphoretic. No erythema. No pallor.  Psychiatric: She has a normal mood and affect. Her behavior is normal. Judgment and thought content normal.          Assessment & Plan:  1. Diverticulitis -symptoms and exam are most consistent with recurrent diverticulitis. Will treat with a two-week course of Cipro and Flagyl. Patient will use hydrocodone as needed for pain. She will increase her fluid intake. She will return to clinic in one month or sooner if symptoms are not improving. If no improvement, would favor getting CT of the abdomen for further evaluation.  2. Diabetes Mellitus -blood sugars are much improved compared to previous. We'll continue current medications.  We'll plan to recheck hemoglobin A1c in 3 months.

## 2011-12-05 LAB — URINE CULTURE: Colony Count: NO GROWTH

## 2011-12-23 ENCOUNTER — Ambulatory Visit (INDEPENDENT_AMBULATORY_CARE_PROVIDER_SITE_OTHER): Payer: Medicare Other | Admitting: *Deleted

## 2011-12-23 DIAGNOSIS — E538 Deficiency of other specified B group vitamins: Secondary | ICD-10-CM

## 2011-12-23 MED ORDER — CYANOCOBALAMIN 1000 MCG/ML IJ SOLN
1000.0000 ug | Freq: Once | INTRAMUSCULAR | Status: AC
  Administered 2011-12-23: 1000 ug via INTRAMUSCULAR

## 2011-12-25 ENCOUNTER — Encounter: Payer: Self-pay | Admitting: Internal Medicine

## 2012-01-06 ENCOUNTER — Ambulatory Visit (INDEPENDENT_AMBULATORY_CARE_PROVIDER_SITE_OTHER): Payer: Medicare Other | Admitting: Internal Medicine

## 2012-01-06 ENCOUNTER — Encounter: Payer: Self-pay | Admitting: Internal Medicine

## 2012-01-06 VITALS — BP 132/70 | HR 91 | Temp 97.5°F | Ht 63.0 in | Wt 170.0 lb

## 2012-01-06 DIAGNOSIS — L409 Psoriasis, unspecified: Secondary | ICD-10-CM

## 2012-01-06 DIAGNOSIS — G589 Mononeuropathy, unspecified: Secondary | ICD-10-CM

## 2012-01-06 DIAGNOSIS — L408 Other psoriasis: Secondary | ICD-10-CM

## 2012-01-06 DIAGNOSIS — K5792 Diverticulitis of intestine, part unspecified, without perforation or abscess without bleeding: Secondary | ICD-10-CM | POA: Insufficient documentation

## 2012-01-06 DIAGNOSIS — G629 Polyneuropathy, unspecified: Secondary | ICD-10-CM

## 2012-01-06 DIAGNOSIS — E119 Type 2 diabetes mellitus without complications: Secondary | ICD-10-CM | POA: Insufficient documentation

## 2012-01-06 DIAGNOSIS — K5732 Diverticulitis of large intestine without perforation or abscess without bleeding: Secondary | ICD-10-CM

## 2012-01-06 MED ORDER — GABAPENTIN 300 MG PO CAPS: 300.0000 mg | ORAL_CAPSULE | Freq: Three times a day (TID) | ORAL | Status: DC

## 2012-01-06 NOTE — Telephone Encounter (Signed)
Ms Nardozzi  I discussed with Dr Dan Humphreys and with your history and current treatments she does not feel comfortable with you getting the vaccine. I would suggest discussing with the doctor prescribing the Humira, as they may have a different opinion. If you do decide to get the vaccine please check coverage through your insurance first as it is a very expensive vaccine.   Please contact our office with any further questions.  Thank you - Maralyn Sago

## 2012-01-06 NOTE — Assessment & Plan Note (Signed)
Patient continues on Humira but has stopped methotrexate. Symptoms are stable. She has followup at [facility] dermatologist next month.

## 2012-01-06 NOTE — Assessment & Plan Note (Signed)
Blood sugars are generally well controlled. Will check hemoglobin A1c with labs in 2 months.

## 2012-01-06 NOTE — Assessment & Plan Note (Signed)
Secondary to herpes zoster. Symptoms are recently worsened. We'll increase Neurontin to 300 mg 3 times daily. Followup in 2 months. Patient will call if symptoms are not improved.

## 2012-01-06 NOTE — Assessment & Plan Note (Signed)
Symptoms improved after last course of antibiotics. Will continue to monitor. If recurrance, then will get CT abdomen.

## 2012-01-06 NOTE — Progress Notes (Signed)
Subjective:    Patient ID: Caroline Gray, female    DOB: [birth year], 66 y.o.   MRN: [id number]  HPI 66 year old female with history of diabetes, psoriasis, chronic left lower quadrant pain secondary to diverticulitis, and neuropathy presents for followup. In regards to her diabetes, she notes that her blood sugars have been fairly well controlled except when she makes poor dietary choices. She reports full compliance with her medications.  In regards to her psoriasis, she reports that she recently opted to stop her methotrexate. She felt that the side effects of fatigue of this medication were to create. She continues on a marrow. She has not noticed any change in her symptoms since stopping methotrexate.  In regards to her chronic left lower quadrant pain she reports that symptoms improved after recent course of antibiotics. However, she occasionally still does have some discomfort in the left lower quadrant. She denies any diarrhea, nausea, vomiting, blood in her stool. She is not currently taking any medication for this.  In regards to her postherpetic neuralgia, she reports that symptoms have recently worsen. She takes Neurontin 300 mg at bedtime but has not noted any improvement with this. Pain is located in the right lower face. It is described as burning or stinging.  Outpatient Encounter Prescriptions as of 01/06/2012  Medication Sig Dispense Refill  . adalimumab (HUMIRA) 40 MG/0.8ML injection Inject 40 mg into the skin once a week.        . Calcium Citrate-Vitamin D (CALCIUM CITRATE + D PO) Take 1 capsule by mouth 2 (two) times daily.        . clobetasol (TEMOVATE) 0.05 % cream Apply 1 application topically 2 (two) times daily.        Marland Kitchen gabapentin (NEURONTIN) 300 MG capsule Take 1 capsule (300 mg total) by mouth 3 (three) times daily.  90 capsule  11  . glipiZIDE (GLUCOTROL XL) 2.5 MG 24 hr tablet Take 2 tablets (5 mg total) by mouth daily.  60 tablet  11  . glucose blood (ONE TOUCH ULTRA TEST)  test strip Check blood sugar twice daily  100 each  11  . LOCOID LIPOCREAM 0.1 % CREA       . metFORMIN (GLUCOPHAGE) 1000 MG tablet Take 1,000 mg by mouth at bedtime.       . Multiple Vitamin (MULTIVITAMIN) capsule Take 1 capsule by mouth daily.        . simvastatin (ZOCOR) 40 MG tablet Take 40 mg by mouth at bedtime.       . urea (CARMOL) 40 % CREA       . DISCONTD: gabapentin (NEURONTIN) 300 MG capsule Take 2 capsules (600 mg total) by mouth daily.  60 capsule  11  . leucovorin (WELLCOVORIN) 5 MG tablet Take 5 mg by mouth once a week.        . methotrexate (RHEUMATREX) 2.5 MG tablet Take 2.5 mg by mouth. Takes on Sunday 3 pills and 1 on Monday        Review of Systems  Constitutional: Negative for fever, chills, appetite change, fatigue and unexpected weight change.  HENT: Negative for ear pain, congestion, sore throat, trouble swallowing, neck pain, voice change and sinus pressure.   Eyes: Negative for visual disturbance.  Respiratory: Negative for cough, shortness of breath, wheezing and stridor.   Cardiovascular: Negative for chest pain, palpitations and leg swelling.  Gastrointestinal: Positive for abdominal pain. Negative for nausea, vomiting, diarrhea, constipation, blood in stool, abdominal distention and anal bleeding.  Genitourinary:  Negative for dysuria and flank pain.  Musculoskeletal: Positive for myalgias. Negative for arthralgias and gait problem.  Skin: Positive for rash and wound. Negative for color change.  Neurological: Negative for dizziness and headaches.  Hematological: Negative for adenopathy. Does not bruise/bleed easily.  Psychiatric/Behavioral: Negative for suicidal ideas, sleep disturbance and dysphoric mood. The patient is not nervous/anxious.    BP 132/70  Pulse 91  Temp(Src) 97.5 F (36.4 C) (Oral)  Ht 5\' 3"  (1.6 m)  Wt 170 lb (77.111 kg)  BMI 30.11 kg/m2  SpO2 95%     Objective:   Physical Exam  Constitutional: She is oriented to person, place, and  time. She appears well-developed and well-nourished. No distress.  HENT:  Head: Normocephalic and atraumatic.  Right Ear: External ear normal.  Left Ear: External ear normal.  Nose: Nose normal.  Mouth/Throat: Oropharynx is clear and moist. No oropharyngeal exudate.  Eyes: Conjunctivae are normal. Pupils are equal, round, and reactive to light. Right eye exhibits no discharge. Left eye exhibits no discharge. No scleral icterus.  Neck: Normal range of motion. Neck supple. No tracheal deviation present. No thyromegaly present.  Cardiovascular: Normal rate, regular rhythm, normal heart sounds and intact distal pulses.  Exam reveals no gallop and no friction rub.   No murmur heard. Pulmonary/Chest: Effort normal and breath sounds normal. No respiratory distress. She has no wheezes. She has no rales. She exhibits no tenderness.  Abdominal: There is no hepatosplenomegaly. There is tenderness in the left lower quadrant. There is no rigidity, no rebound and no guarding.  Musculoskeletal: Normal range of motion. She exhibits no edema and no tenderness.  Lymphadenopathy:    She has no cervical adenopathy.  Neurological: She is alert and oriented to person, place, and time. No cranial nerve deficit. She exhibits normal muscle tone. Coordination normal.  Skin: Skin is warm and dry. No rash noted. Rash is not papular (peeling plaques on palms and soles). She is not diaphoretic. No erythema. No pallor.  Psychiatric: She has a normal mood and affect. Her behavior is normal. Judgment and thought content normal.          Assessment & Plan:

## 2012-01-17 ENCOUNTER — Ambulatory Visit: Payer: Medicare Other

## 2012-01-20 ENCOUNTER — Ambulatory Visit: Payer: Medicare Other

## 2012-01-20 ENCOUNTER — Ambulatory Visit (INDEPENDENT_AMBULATORY_CARE_PROVIDER_SITE_OTHER): Payer: Medicare Other | Admitting: *Deleted

## 2012-01-20 ENCOUNTER — Ambulatory Visit: Payer: Self-pay | Admitting: Urology

## 2012-01-20 DIAGNOSIS — E538 Deficiency of other specified B group vitamins: Secondary | ICD-10-CM

## 2012-01-20 DIAGNOSIS — Z7901 Long term (current) use of anticoagulants: Secondary | ICD-10-CM

## 2012-01-20 MED ORDER — CYANOCOBALAMIN 1000 MCG/ML IJ SOLN
1000.0000 ug | Freq: Once | INTRAMUSCULAR | Status: AC
  Administered 2013-03-30: 1000 ug via INTRAMUSCULAR

## 2012-01-23 ENCOUNTER — Ambulatory Visit: Payer: Self-pay | Admitting: Urology

## 2012-01-23 HISTORY — PX: LITHOTRIPSY: SUR834

## 2012-01-30 ENCOUNTER — Other Ambulatory Visit: Payer: Self-pay | Admitting: Internal Medicine

## 2012-02-07 ENCOUNTER — Ambulatory Visit: Payer: Self-pay | Admitting: Urology

## 2012-02-10 ENCOUNTER — Ambulatory Visit (INDEPENDENT_AMBULATORY_CARE_PROVIDER_SITE_OTHER): Payer: Medicare Other | Admitting: Internal Medicine

## 2012-02-10 ENCOUNTER — Encounter: Payer: Self-pay | Admitting: Internal Medicine

## 2012-02-10 VITALS — BP 130/80 | HR 91 | Temp 98.0°F | Ht 63.0 in | Wt 170.0 lb

## 2012-02-10 DIAGNOSIS — K5732 Diverticulitis of large intestine without perforation or abscess without bleeding: Secondary | ICD-10-CM

## 2012-02-10 DIAGNOSIS — K5792 Diverticulitis of intestine, part unspecified, without perforation or abscess without bleeding: Secondary | ICD-10-CM

## 2012-02-10 MED ORDER — METRONIDAZOLE 500 MG PO TABS: 500.0000 mg | ORAL_TABLET | Freq: Three times a day (TID) | ORAL | Status: AC

## 2012-02-10 MED ORDER — OXYCODONE-ACETAMINOPHEN 5-325 MG PO TABS: 1.0000 | ORAL_TABLET | ORAL | Status: AC | PRN

## 2012-02-10 MED ORDER — CIPROFLOXACIN HCL 500 MG PO TABS: 500.0000 mg | ORAL_TABLET | Freq: Two times a day (BID) | ORAL | Status: AC

## 2012-02-10 NOTE — Progress Notes (Signed)
Subjective:    Patient ID: Caroline Gray, female    DOB: 19-Mar-1946, 66 y.o.   MRN: [id number]  HPI  66 year old female with history of psoriasis on chronic immunosuppression, diabetes, and recurrent diverticulitis presents for acute visit complaining of left lower quadrant pain. She reports that symptoms first began over the weekend. She developed worsening pain described as aching in her left lower abdomen. This radiates to her left back. It is consistent with her previous episodes of diverticulitis. She denies any fever or chills. She denies any blood in her stool, but has had diarrhea. She denies any nausea or vomiting.  Outpatient Encounter Prescriptions as of 02/10/2012  Medication Sig Dispense Refill  . adalimumab (HUMIRA) 40 MG/0.8ML injection Inject 40 mg into the skin once a week.        . Calcium Citrate-Vitamin D (CALCIUM CITRATE + D PO) Take 1 capsule by mouth 2 (two) times daily.        . clobetasol (TEMOVATE) 0.05 % cream Apply 1 application topically 2 (two) times daily.        Marland Kitchen gabapentin (NEURONTIN) 300 MG capsule Take 1 capsule (300 mg total) by mouth 3 (three) times daily.  90 capsule  11  . glipiZIDE (GLUCOTROL XL) 2.5 MG 24 hr tablet Take 2 tablets (5 mg total) by mouth daily.  60 tablet  11  . glucose blood (ONE TOUCH ULTRA TEST) test strip Check blood sugar twice daily  100 each  11  . leucovorin (WELLCOVORIN) 5 MG tablet Take 5 mg by mouth once a week.        Marland Kitchen LOCOID LIPOCREAM 0.1 % CREA       . metFORMIN (GLUCOPHAGE) 1000 MG tablet Take 1,000 mg by mouth at bedtime.       . methotrexate (RHEUMATREX) 2.5 MG tablet Take 2.5 mg by mouth. Takes on Sunday 3 pills and 1 on Monday      . Multiple Vitamin (MULTIVITAMIN) capsule Take 1 capsule by mouth daily.        . simvastatin (ZOCOR) 40 MG tablet TAKE ONE TABLET BY MOUTH NIGHTLY AT BEDTIME  90 tablet  2  . urea (CARMOL) 40 % CREA       . ciprofloxacin (CIPRO) 500 MG tablet Take 1 tablet (500 mg total) by mouth 2 (two) times  daily.  28 tablet  0  . metroNIDAZOLE (FLAGYL) 500 MG tablet Take 1 tablet (500 mg total) by mouth 3 (three) times daily.  42 tablet  0  . oxyCODONE-acetaminophen (ROXICET) 5-325 MG per tablet Take 1 tablet by mouth every 4 (four) hours as needed for pain.  60 tablet  0    BP 130/80  Pulse 91  Temp(Src) 98 F (36.7 C) (Oral)  Ht 5\' 3"  (1.6 m)  Wt 170 lb (77.111 kg)  BMI 30.11 kg/m2  SpO2 95%  Review of Systems  Constitutional: Negative for fever, chills, appetite change, fatigue and unexpected weight change.  HENT: Negative for ear pain, congestion, sore throat, trouble swallowing, neck pain, voice change and sinus pressure.   Eyes: Negative for visual disturbance.  Respiratory: Negative for cough, shortness of breath, wheezing and stridor.   Cardiovascular: Negative for chest pain, palpitations and leg swelling.  Gastrointestinal: Positive for abdominal pain. Negative for nausea, vomiting, diarrhea, constipation, blood in stool, abdominal distention and anal bleeding.  Genitourinary: Negative for dysuria and flank pain.  Musculoskeletal: Negative for myalgias, arthralgias and gait problem.  Skin: Negative for color change and rash.  Neurological:  Negative for dizziness and headaches.  Hematological: Negative for adenopathy. Does not bruise/bleed easily.  Psychiatric/Behavioral: Negative for suicidal ideas, sleep disturbance and dysphoric mood. The patient is not nervous/anxious.        Objective:   Physical Exam  Constitutional: She is oriented to person, place, and time. She appears well-developed and well-nourished. No distress.  HENT:  Head: Normocephalic and atraumatic.  Right Ear: External ear normal.  Left Ear: External ear normal.  Nose: Nose normal.  Mouth/Throat: Oropharynx is clear and moist. No oropharyngeal exudate.  Eyes: Conjunctivae are normal. Pupils are equal, round, and reactive to light. Right eye exhibits no discharge. Left eye exhibits no discharge. No  scleral icterus.  Neck: Normal range of motion. Neck supple. No tracheal deviation present. No thyromegaly present.  Cardiovascular: Normal rate, regular rhythm, normal heart sounds and intact distal pulses.  Exam reveals no gallop and no friction rub.   No murmur heard. Pulmonary/Chest: Effort normal and breath sounds normal. No respiratory distress. She has no wheezes. She has no rales. She exhibits no tenderness.  Abdominal: Soft. Bowel sounds are normal. She exhibits distension. There is tenderness in the left lower quadrant. There is no guarding.  Musculoskeletal: Normal range of motion. She exhibits no edema and no tenderness.  Lymphadenopathy:    She has no cervical adenopathy.  Neurological: She is alert and oriented to person, place, and time. No cranial nerve deficit. She exhibits normal muscle tone. Coordination normal.  Skin: Skin is warm and dry. No rash noted. She is not diaphoretic. No erythema. No pallor.  Psychiatric: She has a normal mood and affect. Her behavior is normal. Judgment and thought content normal.          Assessment & Plan:

## 2012-02-10 NOTE — Assessment & Plan Note (Signed)
Acute worsening.  Will start Cipro and Flagyl x 14 days.  Will use percocet for severe pain. Pt will call or RTC if symptoms not improving over next 48hr or if worsening.

## 2012-02-19 ENCOUNTER — Ambulatory Visit (INDEPENDENT_AMBULATORY_CARE_PROVIDER_SITE_OTHER): Payer: Medicare Other | Admitting: *Deleted

## 2012-02-19 DIAGNOSIS — E538 Deficiency of other specified B group vitamins: Secondary | ICD-10-CM

## 2012-02-19 MED ORDER — CYANOCOBALAMIN 1000 MCG/ML IJ SOLN
1000.0000 ug | Freq: Once | INTRAMUSCULAR | Status: AC
  Administered 2012-02-19: 1000 ug via INTRAMUSCULAR

## 2012-03-18 ENCOUNTER — Other Ambulatory Visit (INDEPENDENT_AMBULATORY_CARE_PROVIDER_SITE_OTHER): Payer: Medicare Other | Admitting: *Deleted

## 2012-03-18 DIAGNOSIS — E119 Type 2 diabetes mellitus without complications: Secondary | ICD-10-CM

## 2012-03-18 DIAGNOSIS — E538 Deficiency of other specified B group vitamins: Secondary | ICD-10-CM

## 2012-03-18 DIAGNOSIS — IMO0001 Reserved for inherently not codable concepts without codable children: Secondary | ICD-10-CM

## 2012-03-18 LAB — COMPREHENSIVE METABOLIC PANEL
ALT: 20 U/L (ref 0–35)
AST: 26 U/L (ref 0–37)
CO2: 26 mEq/L (ref 19–32)
Chloride: 104 mEq/L (ref 96–112)
GFR: 58.59 mL/min — ABNORMAL LOW (ref >60.00)
Sodium: 140 mEq/L (ref 135–145)
Total Bilirubin: 0.7 mg/dL (ref 0.3–1.2)
Total Protein: 7.6 g/dL (ref 6.0–8.3)

## 2012-03-18 MED ORDER — CYANOCOBALAMIN 1000 MCG/ML IJ SOLN
1000.0000 ug | Freq: Once | INTRAMUSCULAR | Status: AC
  Administered 2012-03-18: 1000 ug via INTRAMUSCULAR

## 2012-03-20 ENCOUNTER — Encounter: Payer: Self-pay | Admitting: Internal Medicine

## 2012-03-20 ENCOUNTER — Ambulatory Visit (INDEPENDENT_AMBULATORY_CARE_PROVIDER_SITE_OTHER): Payer: Medicare Other | Admitting: Internal Medicine

## 2012-03-20 VITALS — BP 140/84 | HR 76 | Temp 98.1°F | Resp 14 | Wt 169.5 lb

## 2012-03-20 DIAGNOSIS — K5792 Diverticulitis of intestine, part unspecified, without perforation or abscess without bleeding: Secondary | ICD-10-CM

## 2012-03-20 DIAGNOSIS — L409 Psoriasis, unspecified: Secondary | ICD-10-CM

## 2012-03-20 DIAGNOSIS — L408 Other psoriasis: Secondary | ICD-10-CM

## 2012-03-20 DIAGNOSIS — K5732 Diverticulitis of large intestine without perforation or abscess without bleeding: Secondary | ICD-10-CM

## 2012-03-20 DIAGNOSIS — E119 Type 2 diabetes mellitus without complications: Secondary | ICD-10-CM

## 2012-03-20 MED ORDER — GLIPIZIDE ER 2.5 MG PO TB24: ORAL_TABLET | ORAL | Status: DC

## 2012-03-20 NOTE — Assessment & Plan Note (Signed)
Symptoms currently well-controlled. Will continue to monitor. Has recurrent episodes of diverticulitis and is at high risk for infection because of immunosuppressive medications and diabetes.

## 2012-03-20 NOTE — Progress Notes (Signed)
Subjective:    Patient ID: Caroline Gray, female    DOB: May 03, 1946, 66 y.o.   MRN: [id number]  HPI 66 year old female with history of psoriasis, diabetes, and diverticulitis presents for followup. She reports that this is been a very stressful time for her. She notes some dietary indiscretion as she has been very busy caring for a friend who has been ill. Recent hemoglobin A1c was 7.3% and she has noted some elevation of fasting sugars, near 150. She reports full compliance with glipizide. Next  In regards to her diverticulitis, she reports some intermittent left lower quadrant pain. However, in general her symptoms are much improved compared to her last visit. She denies any fever, chills, hematochezia, nausea, vomiting.  In regards to her psoriasis, she continues on methotrexate and Humira. She notes minimal improvement with these medications. She continues to have plaques over her feet and hands. She notes that the symptoms are worsened by stress.  Outpatient Encounter Prescriptions as of 03/20/2012  Medication Sig Dispense Refill  . adalimumab (HUMIRA) 40 MG/0.8ML injection Inject 40 mg into the skin once a week.        . Calcium Citrate-Vitamin D (CALCIUM CITRATE + D PO) Take 1 capsule by mouth 2 (two) times daily.        . clobetasol (TEMOVATE) 0.05 % cream Apply 1 application topically 2 (two) times daily.        Marland Kitchen gabapentin (NEURONTIN) 300 MG capsule Take 1 capsule (300 mg total) by mouth 3 (three) times daily.  90 capsule  11  . glipiZIDE (GLUCOTROL XL) 2.5 MG 24 hr tablet Take 2 tablets by mouth in morning and 1 tablet by mouth in the evening  90 tablet  11  . glucose blood (ONE TOUCH ULTRA TEST) test strip Check blood sugar twice daily  100 each  11  . leucovorin (WELLCOVORIN) 5 MG tablet Take 5 mg by mouth once a week.        Marland Kitchen LOCOID LIPOCREAM 0.1 % CREA       . metFORMIN (GLUCOPHAGE) 1000 MG tablet Take 1,000 mg by mouth at bedtime.       . Multiple Vitamin (MULTIVITAMIN) capsule Take 1  capsule by mouth daily.        . simvastatin (ZOCOR) 40 MG tablet TAKE ONE TABLET BY MOUTH NIGHTLY AT BEDTIME  90 tablet  2  . urea (CARMOL) 40 % CREA       . DISCONTD: glipiZIDE (GLUCOTROL XL) 2.5 MG 24 hr tablet Take 2 tablets (5 mg total) by mouth daily.  60 tablet  11  . methotrexate (RHEUMATREX) 2.5 MG tablet Take 2.5 mg by mouth. Takes on Sunday 3 pills and 1 on Monday         Review of Systems  Constitutional: Negative for fever, chills, appetite change, fatigue and unexpected weight change.  HENT: Negative for ear pain, congestion, sore throat, trouble swallowing, neck pain, voice change and sinus pressure.   Eyes: Negative for visual disturbance.  Respiratory: Negative for cough, shortness of breath, wheezing and stridor.   Cardiovascular: Negative for chest pain, palpitations and leg swelling.  Gastrointestinal: Positive for abdominal pain. Negative for nausea, vomiting, diarrhea, constipation, blood in stool, abdominal distention and anal bleeding.  Genitourinary: Negative for dysuria and flank pain.  Musculoskeletal: Negative for myalgias, arthralgias and gait problem.  Skin: Positive for color change and rash.  Neurological: Negative for dizziness and headaches.  Hematological: Negative for adenopathy. Does not bruise/bleed easily.  Psychiatric/Behavioral: Positive for  sleep disturbance. Negative for suicidal ideas and dysphoric mood. The patient is nervous/anxious.    BP 140/84  Pulse 76  Temp(Src) 98.1 F (36.7 C) (Oral)  Resp 14  Wt 169 lb 8 oz (76.885 kg)  SpO2 96%     Objective:   Physical Exam  Constitutional: She is oriented to person, place, and time. She appears well-developed and well-nourished. No distress.  HENT:  Head: Normocephalic and atraumatic.  Right Ear: External ear normal.  Left Ear: External ear normal.  Nose: Nose normal.  Mouth/Throat: Oropharynx is clear and moist. No oropharyngeal exudate.  Eyes: Conjunctivae are normal. Pupils are equal,  round, and reactive to light. Right eye exhibits no discharge. Left eye exhibits no discharge. No scleral icterus.  Neck: Normal range of motion. Neck supple. No tracheal deviation present. No thyromegaly present.  Cardiovascular: Normal rate, regular rhythm, normal heart sounds and intact distal pulses.  Exam reveals no gallop and no friction rub.   No murmur heard. Pulmonary/Chest: Effort normal and breath sounds normal. No respiratory distress. She has no wheezes. She has no rales. She exhibits no tenderness.  Musculoskeletal: Normal range of motion. She exhibits no edema and no tenderness.  Lymphadenopathy:    She has no cervical adenopathy.  Neurological: She is alert and oriented to person, place, and time. No cranial nerve deficit. She exhibits normal muscle tone. Coordination normal.  Skin: Skin is warm and dry. Rash noted. She is not diaphoretic. There is erythema. No pallor.  Psychiatric: She has a normal mood and affect. Her behavior is normal. Judgment and thought content normal.          Assessment & Plan:

## 2012-03-20 NOTE — Assessment & Plan Note (Signed)
Recent hemoglobin A1c was 7.3% which is slightly higher for her. Likely secondary to some dietary indiscretion with recent stressors leading her to eat out more than at home. We'll continue to monitor blood sugars. Will increase glipizide to 5 mg in the morning and 2.5 mg at night. Followup in 3 months.

## 2012-03-20 NOTE — Assessment & Plan Note (Signed)
Persistent plaques over her feet and hands. Currently on methotrexate and Humira. Will continue to monitor.

## 2012-04-12 ENCOUNTER — Other Ambulatory Visit: Payer: Self-pay | Admitting: Internal Medicine

## 2012-04-16 ENCOUNTER — Encounter: Payer: Self-pay | Admitting: Internal Medicine

## 2012-04-16 ENCOUNTER — Encounter (INDEPENDENT_AMBULATORY_CARE_PROVIDER_SITE_OTHER): Payer: Medicare Other | Admitting: Internal Medicine

## 2012-04-16 DIAGNOSIS — K5732 Diverticulitis of large intestine without perforation or abscess without bleeding: Secondary | ICD-10-CM

## 2012-04-16 DIAGNOSIS — K5792 Diverticulitis of intestine, part unspecified, without perforation or abscess without bleeding: Secondary | ICD-10-CM

## 2012-04-16 MED ORDER — CIPROFLOXACIN HCL 500 MG PO TABS: 500.0000 mg | ORAL_TABLET | Freq: Two times a day (BID) | ORAL | Status: AC

## 2012-04-16 MED ORDER — METRONIDAZOLE 500 MG PO TABS: 500.0000 mg | ORAL_TABLET | Freq: Three times a day (TID) | ORAL | Status: AC

## 2012-04-20 ENCOUNTER — Ambulatory Visit (INDEPENDENT_AMBULATORY_CARE_PROVIDER_SITE_OTHER): Payer: Medicare Other | Admitting: *Deleted

## 2012-04-20 DIAGNOSIS — E538 Deficiency of other specified B group vitamins: Secondary | ICD-10-CM

## 2012-04-20 MED ORDER — CYANOCOBALAMIN 1000 MCG/ML IJ SOLN
1000.0000 ug | Freq: Once | INTRAMUSCULAR | Status: AC
  Administered 2012-04-20: 1000 ug via INTRAMUSCULAR

## 2012-05-06 ENCOUNTER — Ambulatory Visit: Payer: Self-pay | Admitting: Urology

## 2012-05-22 ENCOUNTER — Ambulatory Visit (INDEPENDENT_AMBULATORY_CARE_PROVIDER_SITE_OTHER): Payer: Medicare Other | Admitting: Internal Medicine

## 2012-05-22 DIAGNOSIS — E538 Deficiency of other specified B group vitamins: Secondary | ICD-10-CM

## 2012-05-22 MED ORDER — CYANOCOBALAMIN 1000 MCG/ML IJ SOLN
1000.0000 ug | Freq: Once | INTRAMUSCULAR | Status: AC
  Administered 2012-05-22: 1000 ug via INTRAMUSCULAR

## 2012-06-23 ENCOUNTER — Encounter: Payer: Self-pay | Admitting: Internal Medicine

## 2012-06-23 ENCOUNTER — Ambulatory Visit (INDEPENDENT_AMBULATORY_CARE_PROVIDER_SITE_OTHER): Payer: Medicare Other | Admitting: Internal Medicine

## 2012-06-23 VITALS — BP 120/72 | HR 85 | Temp 97.8°F | Ht 63.0 in | Wt 169.0 lb

## 2012-06-23 DIAGNOSIS — E785 Hyperlipidemia, unspecified: Secondary | ICD-10-CM

## 2012-06-23 DIAGNOSIS — L409 Psoriasis, unspecified: Secondary | ICD-10-CM

## 2012-06-23 DIAGNOSIS — E1165 Type 2 diabetes mellitus with hyperglycemia: Secondary | ICD-10-CM | POA: Insufficient documentation

## 2012-06-23 DIAGNOSIS — R109 Unspecified abdominal pain: Secondary | ICD-10-CM

## 2012-06-23 DIAGNOSIS — L408 Other psoriasis: Secondary | ICD-10-CM

## 2012-06-23 DIAGNOSIS — E119 Type 2 diabetes mellitus without complications: Secondary | ICD-10-CM

## 2012-06-23 DIAGNOSIS — E538 Deficiency of other specified B group vitamins: Secondary | ICD-10-CM

## 2012-06-23 LAB — CBC WITH DIFFERENTIAL/PLATELET
Basophils Relative: 0.4 % (ref 0.0–3.0)
Eosinophils Absolute: 0.2 10*3/uL (ref 0.0–0.7)
Hemoglobin: 13.6 g/dL (ref 12.0–15.0)
MCHC: 33.8 g/dL (ref 30.0–36.0)
MCV: 100.1 fl — ABNORMAL HIGH (ref 78.0–100.0)
Monocytes Absolute: 0.9 10*3/uL (ref 0.1–1.0)
Neutro Abs: 3.5 10*3/uL (ref 1.4–7.7)
RBC: 4.03 Mil/uL (ref 3.87–5.11)

## 2012-06-23 LAB — COMPREHENSIVE METABOLIC PANEL
AST: 40 U/L — ABNORMAL HIGH (ref 0–37)
Albumin: 4.2 g/dL (ref 3.5–5.2)
Alkaline Phosphatase: 49 U/L (ref 39–117)
BUN: 13 mg/dL (ref 6–23)
Glucose, Bld: 132 mg/dL — ABNORMAL HIGH (ref 70–99)
Potassium: 4.9 mEq/L (ref 3.5–5.1)
Total Bilirubin: 1.1 mg/dL (ref 0.3–1.2)

## 2012-06-23 LAB — LDL CHOLESTEROL, DIRECT: Direct LDL: 78.5 mg/dL

## 2012-06-23 MED ORDER — CYANOCOBALAMIN 1000 MCG/ML IJ SOLN: 1000.0000 ug | Freq: Once | INTRAMUSCULAR | Status: DC

## 2012-06-23 MED ORDER — CYANOCOBALAMIN 1000 MCG/ML IJ SOLN
1000.0000 ug | Freq: Once | INTRAMUSCULAR | Status: AC
  Administered 2012-06-23: 1000 ug via INTRAMUSCULAR

## 2012-06-23 NOTE — Assessment & Plan Note (Signed)
Patient was recently seen by dermatology. She has opted to come off of methotrexate. Symptoms are currently well-controlled on Humira. Will continue to monitor.

## 2012-06-23 NOTE — Assessment & Plan Note (Signed)
Patient reports better control of blood sugars. Will check hemoglobin A1c with labs today.

## 2012-06-23 NOTE — Assessment & Plan Note (Signed)
Will check lipids with labs today. Continue Zocor.

## 2012-06-23 NOTE — Progress Notes (Signed)
Subjective:    Patient ID: Caroline Gray, female    DOB: 10-Oct-1946, 66 y.o.   MRN: [id number]  HPI 67 year old female with history of diabetes, hyperlipidemia, psoriasis, and chronic left lower abdominal pain secondary to diverticulitis presents for followup. In the interim since her last visit, she reports blood sugars have been better controlled. She did not bring a record of blood sugars with her today. She reports full compliance with her medications. She notes she was seen by her dermatologist and has opted to stop methotrexate therapy for her psoriasis as she had no benefit with this. She reports symptoms are fairly well-controlled on Humira.  She is concerned about intermittent episodes of left lower, pain. Currently symptoms are fairly well-controlled. However, in the interim since her last visit she has had several flares, requiring antibiotic therapy with Cipro and Flagyl. Each time she has some improvement but not complete resolution of her symptoms. CT scan in the past was consistent with diverticulitis during an episode. She is up-to-date on colonoscopy and had last colonoscopy in 2009. She notes that her son was just diagnosed with alternative proctitis. She questions whether she may have inflammatory bowel disease given her other ongoing health issues. She denies any current diarrhea, blood in her stool, black stool, nausea, vomiting.  Outpatient Encounter Prescriptions as of 06/23/2012  Medication Sig Dispense Refill  . adalimumab (HUMIRA) 40 MG/0.8ML injection Inject 40 mg into the skin once a week.        . Calcium Citrate-Vitamin D (CALCIUM CITRATE + D PO) Take 1 capsule by mouth 2 (two) times daily.        . clobetasol (TEMOVATE) 0.05 % cream Apply 1 application topically 2 (two) times daily.        Marland Kitchen gabapentin (NEURONTIN) 300 MG capsule Take 1 capsule (300 mg total) by mouth 3 (three) times daily.  90 capsule  11  . glipiZIDE (GLUCOTROL XL) 2.5 MG 24 hr tablet Take 2 tablets by mouth in  morning and 1 tablet by mouth in the evening  90 tablet  11  . glucose blood (ONE TOUCH ULTRA TEST) test strip Check blood sugar twice daily  100 each  11  . LOCOID LIPOCREAM 0.1 % CREA       . metFORMIN (GLUCOPHAGE) 1000 MG tablet TAKE ONE TABLET BY MOUTH ONE TIME DAILY  90 tablet  2  . Multiple Vitamin (MULTIVITAMIN) capsule Take 1 capsule by mouth daily.        . simvastatin (ZOCOR) 40 MG tablet TAKE ONE TABLET BY MOUTH NIGHTLY AT BEDTIME  90 tablet  2  . urea (CARMOL) 40 % CREA       . DISCONTD: leucovorin (WELLCOVORIN) 5 MG tablet Take 5 mg by mouth once a week.        Marland Kitchen DISCONTD: methotrexate (RHEUMATREX) 2.5 MG tablet Take 2.5 mg by mouth. Takes on Sunday 3 pills and 1 on Monday       Facility-Administered Encounter Medications as of 06/23/2012  Medication Dose Route Frequency Provider Last Rate Last Dose  . cyanocobalamin ((VITAMIN B-12)) injection 1,000 mcg  1,000 mcg Intramuscular Once Shelia Media, MD      . cyanocobalamin ((VITAMIN B-12)) injection 1,000 mcg  1,000 mcg Intramuscular Once Shelia Media, MD      . cyanocobalamin ((VITAMIN B-12)) injection 1,000 mcg  1,000 mcg Intramuscular Once Shelia Media, MD   1,000 mcg at 06/23/12 4098    Review of Systems  Constitutional: Negative for fever,  chills, appetite change, fatigue and unexpected weight change.  HENT: Negative for ear pain, congestion, sore throat, trouble swallowing, neck pain, voice change and sinus pressure.   Eyes: Negative for visual disturbance.  Respiratory: Negative for cough, shortness of breath, wheezing and stridor.   Cardiovascular: Negative for chest pain, palpitations and leg swelling.  Gastrointestinal: Positive for abdominal pain. Negative for nausea, vomiting, diarrhea, constipation, blood in stool, abdominal distention and anal bleeding.  Genitourinary: Negative for dysuria and flank pain.  Musculoskeletal: Negative for myalgias, arthralgias and gait problem.  Skin: Positive for rash.  Negative for color change.  Neurological: Negative for dizziness and headaches.  Hematological: Negative for adenopathy. Does not bruise/bleed easily.  Psychiatric/Behavioral: Negative for suicidal ideas, disturbed wake/sleep cycle and dysphoric mood. The patient is not nervous/anxious.        Objective:   Physical Exam  Constitutional: She is oriented to person, place, and time. She appears well-developed and well-nourished. No distress.  HENT:  Head: Normocephalic and atraumatic.  Right Ear: External ear normal.  Left Ear: External ear normal.  Nose: Nose normal.  Mouth/Throat: Oropharynx is clear and moist. No oropharyngeal exudate.  Eyes: Conjunctivae are normal. Pupils are equal, round, and reactive to light. Right eye exhibits no discharge. Left eye exhibits no discharge. No scleral icterus.  Neck: Normal range of motion. Neck supple. No tracheal deviation present. No thyromegaly present.  Cardiovascular: Normal rate, regular rhythm, normal heart sounds and intact distal pulses.  Exam reveals no gallop and no friction rub.   No murmur heard. Pulmonary/Chest: Effort normal and breath sounds normal. No respiratory distress. She has no wheezes. She has no rales. She exhibits no tenderness.  Abdominal: Soft. Bowel sounds are normal. She exhibits no distension and no mass. There is tenderness (LLQ). There is no guarding.  Musculoskeletal: Normal range of motion. She exhibits no edema and no tenderness.  Lymphadenopathy:    She has no cervical adenopathy.  Neurological: She is alert and oriented to person, place, and time. No cranial nerve deficit. She exhibits normal muscle tone. Coordination normal.  Skin: Skin is warm and dry. Rash (peeling over palms) noted. She is not diaphoretic. No erythema. No pallor.  Psychiatric: She has a normal mood and affect. Her behavior is normal. Judgment and thought content normal.          Assessment & Plan:

## 2012-06-23 NOTE — Assessment & Plan Note (Signed)
Patient with chronic left lower abdominal pain which is thought secondary to diverticulitis. Currently fairly well controlled. Given persistence of symptoms, question if additional pathology may be present. She is on immunosuppressants at higher risk for chronic infections. Will set up evaluation with GI medicine for possible repeat colonoscopy. If symptoms worsen, would favor repeating CT scan of the abdomen.

## 2012-06-24 ENCOUNTER — Encounter: Payer: Self-pay | Admitting: Internal Medicine

## 2012-06-25 ENCOUNTER — Ambulatory Visit (INDEPENDENT_AMBULATORY_CARE_PROVIDER_SITE_OTHER): Payer: Medicare Other | Admitting: Gastroenterology

## 2012-06-25 ENCOUNTER — Encounter: Payer: Self-pay | Admitting: Gastroenterology

## 2012-06-25 VITALS — BP 120/76 | HR 88 | Ht 63.0 in | Wt 169.0 lb

## 2012-06-25 DIAGNOSIS — Z860100 Personal history of colon polyps, unspecified: Secondary | ICD-10-CM

## 2012-06-25 DIAGNOSIS — R1032 Left lower quadrant pain: Secondary | ICD-10-CM

## 2012-06-25 DIAGNOSIS — R143 Flatulence: Secondary | ICD-10-CM

## 2012-06-25 DIAGNOSIS — R141 Gas pain: Secondary | ICD-10-CM

## 2012-06-25 DIAGNOSIS — R142 Eructation: Secondary | ICD-10-CM

## 2012-06-25 MED ORDER — MOVIPREP 100 G PO SOLR: 1.0000 | Freq: Once | ORAL | Status: DC

## 2012-06-25 MED ORDER — HYOSCYAMINE SULFATE 0.125 MG SL SUBL: SUBLINGUAL_TABLET | SUBLINGUAL | Status: DC

## 2012-06-25 MED ORDER — DIPHENHYDRAMINE HCL 50 MG PO TABS: ORAL_TABLET | ORAL | Status: DC

## 2012-06-25 MED ORDER — PREDNISONE 50 MG PO TABS: ORAL_TABLET | ORAL | Status: AC

## 2012-06-25 NOTE — Patient Instructions (Addendum)
You have been scheduled for a CT scan of the abdomen and pelvis at [facility] ([address]---this is in the same building as [facility]).   You are scheduled on 06/29/12 at 9:30am. You should arrive 15 minutes prior to your appointment time for registration. Please follow the written instructions below on the day of your exam:  WARNING: IF YOU ARE ALLERGIC TO IODINE/X-RAY DYE, PLEASE NOTIFY RADIOLOGY IMMEDIATELY AT [phone]! YOU WILL BE GIVEN A 13 HOUR PREMEDICATION PREP.  1) Do not eat or drink anything after 5:30am (4 hours prior to your test) 2) You have been given 2 bottles of oral contrast to drink. The solution may taste better if refrigerated, but do NOT add ice or any other liquid to this solution. Shake well before drinking.    Drink 1 bottle of contrast @ 7:30am (2 hours prior to your exam)  Drink 1 bottle of contrast @ 8:30am (1 hour prior to your exam)  You may take any medications as prescribed with a small amount of water except for the following: Metformin, Glucophage, Glucovance, Avandamet, Riomet, Fortamet, Actoplus Met, Janumet, Glumetza or Metaglip. The above medications must be held the day of the exam AND 48 hours after the exam.  The purpose of you drinking the oral contrast is to aid in the visualization of your intestinal tract. The contrast solution may cause some diarrhea. Before your exam is started, you will be given a small amount of fluid to drink. Depending on your individual set of symptoms, you may also receive an intravenous injection of x-ray contrast/dye. Plan on being at [facility] for 30 minutes or long, depending on the type of exam you are having performed.  If you have any questions regarding your exam or if you need to reschedule, you may call the CT department at [phone] between the hours of 8:00 am and 5:00 pm, Monday-Friday.  ________________________________________________________________________  Bonita Quin have  been scheduled for a colonoscopy with propofol. Please follow written instructions given to you at your visit today.  Please pick up your prep kit at the pharmacy within the next 1-3 days. If you use inhalers (even only as needed), please bring them with you on the day of your procedure.  We have sent the following medications to your pharmacy for you to pick up at your convenience: Levsin, prednisone, Benadryl.  High Fiber Diet A high fiber diet changes your normal diet to include more whole grains, legumes, fruits, and vegetables. Changes in the diet involve replacing refined carbohydrates with unrefined foods. The calorie level of the diet is essentially unchanged. The Dietary Reference Intake (recommended amount) for adult males is 38 g per day. For adult females, it is 25 g per day. Pregnant and lactating women should consume 28 g of fiber per day. Fiber is the intact part of a plant that is not broken down during digestion. Functional fiber is fiber that has been isolated from the plant to provide a beneficial effect in the body. PURPOSE  Increase stool bulk.   Ease and regulate bowel movements.   Lower cholesterol.  INDICATIONS THAT YOU NEED MORE FIBER  Constipation and hemorrhoids.   Uncomplicated diverticulosis (intestine condition) and irritable bowel syndrome.   Weight management.   As a protective measure against hardening of the arteries (atherosclerosis), diabetes, and cancer.  NOTE OF CAUTION If you have a digestive or bowel problem, ask your caregiver for advice before adding high fiber foods to your diet. Some of the  following medical problems are such that a high fiber diet should not be used without consulting your caregiver:  Acute diverticulitis (intestine infection).   Partial small bowel obstructions.   Complicated diverticular disease involving bleeding, rupture (perforation), or abscess (boil, furuncle).   Presence of autonomic neuropathy (nerve damage) or  gastric paresis (stomach cannot empty itself).  GUIDELINES FOR INCREASING FIBER  Start adding fiber to the diet slowly. A gradual increase of about 5 more grams (2 slices of whole-wheat bread, 2 servings of most fruits or vegetables, or 1 bowl of high fiber cereal) per day is best. Too rapid an increase in fiber may result in constipation, flatulence, and bloating.   Drink enough water and fluids to keep your urine clear or pale yellow. Water, juice, or caffeine-free drinks are recommended. Not drinking enough fluid may cause constipation.   Eat a variety of high fiber foods rather than one type of fiber.   Try to increase your intake of fiber through using high fiber foods rather than fiber pills or supplements that contain small amounts of fiber.   The goal is to change the types of food eaten. Do not supplement your present diet with high fiber foods, but replace foods in your present diet.  INCLUDE A VARIETY OF FIBER SOURCES  Replace refined and processed grains with whole grains, canned fruits with fresh fruits, and incorporate other fiber sources. White rice, white breads, and most bakery goods contain little or no fiber.   Brown whole-grain rice, buckwheat oats, and many fruits and vegetables are all good sources of fiber. These include: broccoli, Brussels sprouts, cabbage, cauliflower, beets, sweet potatoes, white potatoes (skin on), carrots, tomatoes, eggplant, squash, berries, fresh fruits, and dried fruits.   Cereals appear to be the richest source of fiber. Cereal fiber is found in whole grains and bran. Bran is the fiber-rich outer coat of cereal grain, which is largely removed in refining. In whole-grain cereals, the bran remains. In breakfast cereals, the largest amount of fiber is found in those with "bran" in their names. The fiber content is sometimes indicated on the label.   You may need to include additional fruits and vegetables each day.   In baking, for 1 cup white  flour, you may use the following substitutions:   1 cup whole-wheat flour minus 2 tbs.    cup white flour plus  cup whole-wheat flour.  Document Released: 11/04/2005 Document Revised: 10/24/2011 Document Reviewed: 09/12/2009 [facility] Patient Information 2012 [place], [place].   cc: Ronna Polio, MD

## 2012-06-25 NOTE — Progress Notes (Signed)
History of Present Illness: This is a 66 year old female who has had several years of intermittent left lower quadrant and left flank pain associated with gas, bloating and alternating bowel habits. She states her symptoms often occur in association with constipation and diarrhea. Her symptoms have become more severe and more frequent over the past several months. She has a history of diverticulosis and diverticulitis and has been treated several times in the past. Apparently diverticulitis was documented on CT scans done elsewhere several years ago. In addition she has a history of bilateral kidney stones and is followed by a urologist in [place]. She has a prior history of adenomatous colon polyps and last underwent colonoscopy in September 2008. Denies weight loss, change in stool caliber, melena, hematochezia, nausea, vomiting, dysphagia, reflux symptoms, chest pain.  Review of Systems: Pertinent positive and negative review of systems were noted in the above HPI section. All other review of systems were otherwise negative.  Current Medications, Allergies, Past Medical History, Past Surgical History, Family History and Social History were reviewed in [employer] record.  Physical Exam: General: Well developed , well nourished, no acute distress Head: Normocephalic and atraumatic Eyes:  sclerae anicteric, EOMI Ears: Normal auditory acuity Mouth: No deformity or lesions Neck: Supple, no masses or thyromegaly Lungs: Clear throughout to auscultation Heart: Regular rate and rhythm; no murmurs, rubs or bruits Abdomen: Soft, minimal left lower quadrant tenderness without rebound or guarding and non distended. No masses, hepatosplenomegaly or hernias noted. Normal Bowel sounds Rectal: Deferred to colonoscopy Musculoskeletal: Symmetrical with no gross deformities  Skin: No lesions on visible extremities Pulses:  Normal pulses noted Extremities: No clubbing, cyanosis, edema or  deformities noted Neurological: Alert oriented x 4, grossly nonfocal Cervical Nodes:  No significant cervical adenopathy Inguinal Nodes: No significant inguinal adenopathy Psychological:  Alert and cooperative. Normal mood and affect  Assessment and Recommendations:  1. Intermittent left lower quadrant and left flank pain. Symptoms associated with gas, bloating and changes in bowel habits. Her symptoms are typical for irritable bowel syndrome. In addition she may have intermittent symptoms from kidney stones and intermittent episodes of diverticulitis. At this time her symptoms are not typical of diverticulitis or kidney stones. Plan to treat for irritable bowel syndrome and to evaluate for other problems. Proceed with a contrasted CT scan of the abdomen and pelvis and colonoscopy for further evaluation. Begin a high fiber diet and hyoscyamine as needed. Consider a trial of probiotics. The risks, benefits, and alternatives to colonoscopy with possible biopsy and possible polypectomy were discussed with the patient and they consent to proceed.   2. Personal history of adenomatous colon polyps, initially diagnosed in 2003. She is due for a five-year surveillance colonoscopy. Colonoscopy scheduled as above.

## 2012-06-29 ENCOUNTER — Ambulatory Visit (INDEPENDENT_AMBULATORY_CARE_PROVIDER_SITE_OTHER)
Admission: RE | Admit: 2012-06-29 | Discharge: 2012-06-29 | Disposition: A | Discharge status: Home / Self Care | Payer: Medicare Other | Source: Ambulatory Visit | Attending: Gastroenterology | Admitting: Gastroenterology

## 2012-06-29 DIAGNOSIS — R1032 Left lower quadrant pain: Secondary | ICD-10-CM

## 2012-06-29 MED ORDER — IOHEXOL 300 MG/ML  SOLN
100.0000 mL | Freq: Once | INTRAMUSCULAR | Status: AC | PRN
  Administered 2012-06-29: 100 mL via INTRAVENOUS

## 2012-07-02 ENCOUNTER — Encounter: Payer: Self-pay | Admitting: Internal Medicine

## 2012-07-06 ENCOUNTER — Other Ambulatory Visit: Payer: Medicare Other

## 2012-07-27 ENCOUNTER — Ambulatory Visit: Payer: Medicare Other

## 2012-07-29 ENCOUNTER — Encounter: Payer: Self-pay | Admitting: Gastroenterology

## 2012-07-29 ENCOUNTER — Ambulatory Visit (AMBULATORY_SURGERY_CENTER): Payer: Medicare Other | Admitting: Gastroenterology

## 2012-07-29 VITALS — BP 127/65 | HR 86 | Temp 98.7°F | Resp 20 | Ht 63.0 in | Wt 167.0 lb

## 2012-07-29 DIAGNOSIS — Z1211 Encounter for screening for malignant neoplasm of colon: Secondary | ICD-10-CM

## 2012-07-29 DIAGNOSIS — R1032 Left lower quadrant pain: Secondary | ICD-10-CM

## 2012-07-29 DIAGNOSIS — Z86010 Personal history of colonic polyps: Secondary | ICD-10-CM

## 2012-07-29 MED ORDER — SODIUM CHLORIDE 0.9 % IV SOLN: 500.0000 mL | INTRAVENOUS | Status: DC

## 2012-07-29 NOTE — Progress Notes (Signed)
Patient did not experience any of the following events: a burn prior to discharge; a fall within the facility; wrong site/side/patient/procedure/implant event; or a hospital transfer or hospital admission upon discharge from the facility. (G8907) Patient did not have preoperative order for IV antibiotic SSI prophylaxis. (G8918)  

## 2012-07-29 NOTE — Patient Instructions (Signed)
YOU HAD AN ENDOSCOPIC PROCEDURE TODAY AT THE [facility]: Refer to the procedure report that was given to you for any specific questions about what was found during the examination.  If the procedure report does not answer your questions, please call your gastroenterologist to clarify.  If you requested that your care partner not be given the details of your procedure findings, then the procedure report has been included in a sealed envelope for you to review at your convenience later.  YOU SHOULD EXPECT: Some feelings of bloating in the abdomen. Passage of more gas than usual.  Walking can help get rid of the air that was put into your GI tract during the procedure and reduce the bloating. If you had a lower endoscopy (such as a colonoscopy or flexible sigmoidoscopy) you may notice spotting of blood in your stool or on the toilet paper. If you underwent a bowel prep for your procedure, then you may not have a normal bowel movement for a few days.  DIET: Your first meal following the procedure should be a light meal and then it is ok to progress to your normal diet.  A half-sandwich or bowl of soup is an example of a good first meal.  Heavy or fried foods are harder to digest and may make you feel nauseous or bloated.  Likewise meals heavy in dairy and vegetables can cause extra gas to form and this can also increase the bloating.  Drink plenty of fluids but you should avoid alcoholic beverages for 24 hours.  ACTIVITY: Your care partner should take you home directly after the procedure.  You should plan to take it easy, moving slowly for the rest of the day.  You can resume normal activity the day after the procedure however you should NOT DRIVE or use heavy machinery for 24 hours (because of the sedation medicines used during the test).    SYMPTOMS TO REPORT IMMEDIATELY: A gastroenterologist can be reached at any hour.  During normal business hours, 8:30 AM to 5:00 PM Monday through Friday,  call [phone].  After hours and on weekends, please call the GI answering service at [phone] who will take a message and have the physician on call contact you.   Following lower endoscopy (colonoscopy or flexible sigmoidoscopy):  Excessive amounts of blood in the stool  Significant tenderness or worsening of abdominal pains  Swelling of the abdomen that is new, acute  Fever of 100F or higher    FOLLOW UP: If any biopsies were taken you will be contacted by phone or by letter within the next 1-3 weeks.  Call your gastroenterologist if you have not heard about the biopsies in 3 weeks.  Our staff will call the home number listed on your records the next business day following your procedure to check on you and address any questions or concerns that you may have at that time regarding the information given to you following your procedure. This is a courtesy call and so if there is no answer at the home number and we have not heard from you through the emergency physician on call, we will assume that you have returned to your regular daily activities without incident.  SIGNATURES/CONFIDENTIALITY: You and/or your care partner have signed paperwork which will be entered into your electronic medical record.  These signatures attest to the fact that that the information above on your After Visit Summary has been reviewed and is understood.  Full responsibility of the confidentiality   of this discharge information lies with you and/or your care-partner.     

## 2012-07-29 NOTE — Progress Notes (Signed)
1350 MD changed to ped scope

## 2012-07-29 NOTE — Op Note (Addendum)
[facility] [address] [place], [address]   COLONOSCOPY PROCEDURE REPORT  PATIENT: Caroline, Gray  MR#: [id number] BIRTHDATE: [birth year] , 66  yrs. old GENDER: Female ENDOSCOPIST: Meryl Dare, MD, [facility] REFERRED [id number] Dan Humphreys, M.D. PROCEDURE DATE:  07/29/2012 PROCEDURE:   Colonoscopy, diagnostic ASA CLASS:   Class II INDICATIONS:patient's personal history of adenomatous colon polyps, 2003, and abdominal pain in the lower left quadrant. MEDICATIONS: MAC sedation, administered by CRNA and propofol (Diprivan) 270mg  IV DESCRIPTION OF PROCEDURE:   After the risks benefits and alternatives of the procedure were thoroughly explained, informed consent was obtained.  A digital rectal exam revealed no abnormalities of the rectum.   The LB [id number]-[id number]  endoscope was introduced through the anus and advanced to the cecum, which was identified by both the appendix and ileocecal valve. No adverse events experienced.   Limited by a tortuous sigmoid colon.   The quality of the prep was good, using MoviPrep  The instrument was then slowly withdrawn as the colon was fully examined.   COLON FINDINGS: Severe diverticulosis was noted in the sigmoid colon and descending colon.  The sigmoid colon was fixed and tortuous, moderately difficult to traverse this area, required changing to a pediatric colonoscope.  The colon was otherwise normal.  There was no diverticulosis, inflammation, polyps or cancers unless previously stated.  Retroflexed views revealed no abnormalities. The time to cecum=8 minutes 56 seconds.  Withdrawal time=8 minutes 05 seconds.  The scope was withdrawn and the procedure completed.  COMPLICATIONS: There were no complications.  ENDOSCOPIC IMPRESSION: 1.   Severe diverticulosis was noted in the sigmoid colon and descending colon 2.   The colon was otherwise normal  RECOMMENDATIONS: 1.  High fiber diet with liberal fluid intake. 2.  Repeat  Colonoscopy in 5 years. 3.  Severe diverticulosis, IBS and possibly adhesions all may be contributing her LLQ pain   eSigned:  Meryl Dare, MD, [facility] 07/29/2012 2:06 PM Revised: 07/29/2012 2:06 PM

## 2012-07-30 ENCOUNTER — Telehealth: Payer: Self-pay | Admitting: *Deleted

## 2012-07-30 NOTE — Telephone Encounter (Signed)
  Follow up Call-  Call back number 07/29/2012  Post procedure Call Back phone  # [phone]  Permission to leave phone message Yes     Patient questions:  Do you have a fever, pain , or abdominal swelling? no Pain Score  0 *  Have you tolerated food without any problems? yes  Have you been able to return to your normal activities? yes  Do you have any questions about your discharge instructions: Diet   no Medications  no Follow up visit  no  Do you have questions or concerns about your Care? no  Actions: * If pain score is 4 or above: No action needed, pain <4.

## 2012-08-30 ENCOUNTER — Other Ambulatory Visit: Payer: Self-pay | Admitting: Internal Medicine

## 2012-09-07 ENCOUNTER — Ambulatory Visit: Payer: Self-pay | Admitting: Urology

## 2012-09-10 ENCOUNTER — Ambulatory Visit (INDEPENDENT_AMBULATORY_CARE_PROVIDER_SITE_OTHER): Payer: Medicare Other | Admitting: Internal Medicine

## 2012-09-10 DIAGNOSIS — E538 Deficiency of other specified B group vitamins: Secondary | ICD-10-CM

## 2012-09-10 MED ORDER — CYANOCOBALAMIN 1000 MCG/ML IJ SOLN
1000.0000 ug | Freq: Once | INTRAMUSCULAR | Status: AC
  Administered 2012-09-10: 1000 ug via INTRAMUSCULAR

## 2012-09-11 NOTE — Progress Notes (Signed)
  Subjective:    Patient ID: Caroline Gray, female    DOB: Jun 13, 1946, 66 y.o.   MRN: [id number]  HPI    Review of Systems     Objective:   Physical Exam        Assessment & Plan:  Pt was here for a b12 injection.

## 2012-09-28 ENCOUNTER — Ambulatory Visit (INDEPENDENT_AMBULATORY_CARE_PROVIDER_SITE_OTHER): Payer: Medicare Other | Admitting: Internal Medicine

## 2012-09-28 ENCOUNTER — Encounter: Payer: Self-pay | Admitting: Internal Medicine

## 2012-09-28 VITALS — BP 140/78 | HR 120 | Temp 98.3°F | Ht 63.0 in | Wt 172.0 lb

## 2012-09-28 DIAGNOSIS — R109 Unspecified abdominal pain: Secondary | ICD-10-CM

## 2012-09-28 DIAGNOSIS — L409 Psoriasis, unspecified: Secondary | ICD-10-CM

## 2012-09-28 DIAGNOSIS — Z1239 Encounter for other screening for malignant neoplasm of breast: Secondary | ICD-10-CM

## 2012-09-28 DIAGNOSIS — E119 Type 2 diabetes mellitus without complications: Secondary | ICD-10-CM

## 2012-09-28 DIAGNOSIS — E538 Deficiency of other specified B group vitamins: Secondary | ICD-10-CM

## 2012-09-28 DIAGNOSIS — L408 Other psoriasis: Secondary | ICD-10-CM

## 2012-09-28 DIAGNOSIS — K589 Irritable bowel syndrome without diarrhea: Secondary | ICD-10-CM

## 2012-09-28 MED ORDER — CYANOCOBALAMIN 1000 MCG/ML IJ SOLN
1000.0000 ug | Freq: Once | INTRAMUSCULAR | Status: AC
  Administered 2012-09-28: 1000 ug via INTRAMUSCULAR

## 2012-09-28 MED ORDER — FLUOXETINE HCL 20 MG PO TABS: 20.0000 mg | ORAL_TABLET | Freq: Every day | ORAL | Status: DC

## 2012-09-28 NOTE — Assessment & Plan Note (Signed)
Symptoms of irritable bowel syndrome contributing to chronic abdominal pain. Will try adding SSRI to see if any improvement in symptoms. Followup one month.

## 2012-09-28 NOTE — Assessment & Plan Note (Signed)
Symptoms recently worsening on new medication prescribed by dermatologist. Patient will followup with her dermatologist later this week. Question if she might benefit from resuming methotrexate.

## 2012-09-28 NOTE — Progress Notes (Signed)
Subjective:    Patient ID: Caroline Gray, female    DOB: 1946-01-30, 66 y.o.   MRN: [id number]  HPI  66 year old female with history of chronic left lower abdominal pain secondary to nephrolithiasis, diverticulitis, and irritable bowel syndrome, severe psoriasis, and diabetes presents for followup. She reports that this is been a difficult time for her. She reports significant left lower abdominal pain for which she has been using Levsin and intermittent oxycodone with some improvement. Pain is described as aching. It does not radiate. It is not generally associated with diarrhea but she does occasionally have episodes of loose stools after eating certain foods. She also notes that she was diagnosed with nephrolithiasis which may be contributing to her pain. She is scheduled for lithotripsy later this week.  In regards to her psoriasis, she reports she was recently placed on a new medication by her dermatologist. She reports significant worsening of her psoriasis with peeling and bleeding on the palms of her hands and feet. She plans to followup with her dermatologist this week.  In regards to diabetes, she reports blood sugars have been elevated, typically closer to 150-200 fasting. She reports compliance with diet and medications.  Outpatient Encounter Prescriptions as of 09/28/2012  Medication Sig Dispense Refill  . acitretin (SORIATANE) 25 MG capsule Take 25 mg by mouth daily.      Marland Kitchen adalimumab (HUMIRA) 40 MG/0.8ML injection Inject 40 mg into the skin once a week.        . Calcium Citrate-Vitamin D (CALCIUM CITRATE + D PO) Take 1 capsule by mouth 2 (two) times daily.        . clobetasol (TEMOVATE) 0.05 % cream Apply 1 application topically 2 (two) times daily.        Marland Kitchen gabapentin (NEURONTIN) 300 MG capsule Take 1 capsule (300 mg total) by mouth 3 (three) times daily.  90 capsule  11  . glipiZIDE (GLUCOTROL XL) 2.5 MG 24 hr tablet Take 2 tablets by mouth in morning and 1 tablet by mouth in the evening   90 tablet  11  . hyoscyamine (LEVSIN/SL) 0.125 MG SL tablet 1-2 tablets by mouth every 4 hours as needed for pain  100 tablet  5  . LOCOID LIPOCREAM 0.1 % CREA       . metFORMIN (GLUCOPHAGE) 1000 MG tablet TAKE ONE TABLET BY MOUTH ONE TIME DAILY  90 tablet  2  . Multiple Vitamin (MULTIVITAMIN) capsule Take 1 capsule by mouth daily.        . ONE TOUCH ULTRA TEST test strip CHECK BLOOD SUGAR TWICE DAILY  50 each  10  . simvastatin (ZOCOR) 40 MG tablet TAKE ONE TABLET BY MOUTH NIGHTLY AT BEDTIME  90 tablet  2  . urea (CARMOL) 40 % CREA as directed.       Marland Kitchen FLUoxetine (PROZAC) 20 MG tablet Take 1 tablet (20 mg total) by mouth daily.  30 tablet  3    BP 140/78  Pulse 120  Temp 98.3 F (36.8 C) (Oral)  Ht 5\' 3"  (1.6 m)  Wt 172 lb (78.019 kg)  BMI 30.47 kg/m2  SpO2 94%  Review of Systems  Constitutional: Negative for fever, chills, appetite change, fatigue and unexpected weight change.  HENT: Negative for ear pain, congestion, sore throat, trouble swallowing, neck pain, voice change and sinus pressure.   Eyes: Negative for visual disturbance.  Respiratory: Negative for cough, shortness of breath, wheezing and stridor.   Cardiovascular: Negative for chest pain, palpitations and leg swelling.  Gastrointestinal: Positive for abdominal pain. Negative for nausea, vomiting, diarrhea, constipation, blood in stool, abdominal distention and anal bleeding.  Genitourinary: Negative for dysuria and flank pain.  Musculoskeletal: Negative for myalgias, arthralgias and gait problem.  Skin: Positive for rash. Negative for color change.  Neurological: Negative for dizziness and headaches.  Hematological: Negative for adenopathy. Does not bruise/bleed easily.  Psychiatric/Behavioral: Negative for suicidal ideas, sleep disturbance and dysphoric mood. The patient is not nervous/anxious.        Objective:   Physical Exam  Constitutional: She is oriented to person, place, and time. She appears  well-developed and well-nourished. No distress.  HENT:  Head: Normocephalic and atraumatic.  Right Ear: External ear normal.  Left Ear: External ear normal.  Nose: Nose normal.  Mouth/Throat: Oropharynx is clear and moist. No oropharyngeal exudate.  Eyes: Conjunctivae normal are normal. Pupils are equal, round, and reactive to light. Right eye exhibits no discharge. Left eye exhibits no discharge. No scleral icterus.  Neck: Normal range of motion. Neck supple. No tracheal deviation present. No thyromegaly present.  Cardiovascular: Normal rate, regular rhythm, normal heart sounds and intact distal pulses.  Exam reveals no gallop and no friction rub.   No murmur heard. Pulmonary/Chest: Effort normal and breath sounds normal. No respiratory distress. She has no wheezes. She has no rales. She exhibits no tenderness.  Abdominal: Soft. Bowel sounds are normal. She exhibits no distension and no mass. There is tenderness (left lower quadrant). There is no rebound and no guarding.  Musculoskeletal: Normal range of motion. She exhibits no edema and no tenderness.  Lymphadenopathy:    She has no cervical adenopathy.  Neurological: She is alert and oriented to person, place, and time. No cranial nerve deficit. She exhibits normal muscle tone. Coordination normal.  Skin: Skin is warm and dry. Rash noted. Rash is pustular (with peeling over palms and soles). She is not diaphoretic. There is erythema. No pallor.  Psychiatric: She has a normal mood and affect. Her behavior is normal. Judgment and thought content normal.          Assessment & Plan:

## 2012-09-28 NOTE — Assessment & Plan Note (Signed)
Blood sugars have been elevated recently in the setting of increased stressors with ongoing kidney stone and severe psoriasis. Will check A1c with labs. For now, we'll continue current medications. Followup in one month.

## 2012-09-28 NOTE — Assessment & Plan Note (Signed)
Secondary to nephrolithiasis, irritable bowel syndrome, and diverticulitis. Symptoms are improved with use of Levsin and occasional oxycodone. Will plan to continue. Lithotripsy is scheduled for this week. We'll see if any improvement after this intervention.

## 2012-09-29 ENCOUNTER — Ambulatory Visit: Payer: Medicare Other | Admitting: Internal Medicine

## 2012-10-01 ENCOUNTER — Ambulatory Visit: Payer: Self-pay | Admitting: Urology

## 2012-10-14 ENCOUNTER — Ambulatory Visit: Payer: Self-pay | Admitting: Urology

## 2012-10-26 ENCOUNTER — Other Ambulatory Visit: Payer: Self-pay | Admitting: Internal Medicine

## 2012-10-27 ENCOUNTER — Encounter: Payer: Self-pay | Admitting: Internal Medicine

## 2012-10-27 ENCOUNTER — Ambulatory Visit (INDEPENDENT_AMBULATORY_CARE_PROVIDER_SITE_OTHER): Payer: Medicare Other | Admitting: Internal Medicine

## 2012-10-27 VITALS — BP 138/80 | HR 78 | Temp 98.3°F | Resp 16 | Wt 169.2 lb

## 2012-10-27 DIAGNOSIS — L03119 Cellulitis of unspecified part of limb: Secondary | ICD-10-CM

## 2012-10-27 DIAGNOSIS — E119 Type 2 diabetes mellitus without complications: Secondary | ICD-10-CM

## 2012-10-27 DIAGNOSIS — L409 Psoriasis, unspecified: Secondary | ICD-10-CM

## 2012-10-27 DIAGNOSIS — K5792 Diverticulitis of intestine, part unspecified, without perforation or abscess without bleeding: Secondary | ICD-10-CM

## 2012-10-27 DIAGNOSIS — E538 Deficiency of other specified B group vitamins: Secondary | ICD-10-CM

## 2012-10-27 DIAGNOSIS — K5732 Diverticulitis of large intestine without perforation or abscess without bleeding: Secondary | ICD-10-CM

## 2012-10-27 DIAGNOSIS — L408 Other psoriasis: Secondary | ICD-10-CM

## 2012-10-27 DIAGNOSIS — L03115 Cellulitis of right lower limb: Secondary | ICD-10-CM | POA: Insufficient documentation

## 2012-10-27 DIAGNOSIS — K589 Irritable bowel syndrome without diarrhea: Secondary | ICD-10-CM

## 2012-10-27 LAB — COMPREHENSIVE METABOLIC PANEL
ALT: 18 U/L (ref 0–35)
BUN: 15 mg/dL (ref 6–23)
CO2: 29 mEq/L (ref 19–32)
Calcium: 9.4 mg/dL (ref 8.4–10.5)
Chloride: 102 mEq/L (ref 96–112)
Creatinine, Ser: 0.9 mg/dL (ref 0.4–1.2)
GFR: 66.05 mL/min (ref >60.00)
Glucose, Bld: 150 mg/dL — ABNORMAL HIGH (ref 70–99)

## 2012-10-27 MED ORDER — SULFAMETHOXAZOLE-TMP DS 800-160 MG PO TABS: 1.0000 | ORAL_TABLET | Freq: Two times a day (BID) | ORAL | Status: DC

## 2012-10-27 MED ORDER — CYANOCOBALAMIN 1000 MCG/ML IJ SOLN
1000.0000 ug | Freq: Once | INTRAMUSCULAR | Status: AC
  Administered 2012-10-27: 1000 ug via INTRAMUSCULAR

## 2012-10-27 NOTE — Assessment & Plan Note (Signed)
Medial right foot with some erythema around cracking/peeling areas. Pt reports area was recently warm to touch. No induration or fluctuance noted today. Will start empiric Bactrim bid x 1 week. If no improvement, pt will call or RTC.

## 2012-10-27 NOTE — Assessment & Plan Note (Signed)
Symptoms much improved after addition of prozac. Will plan to continue. Follow up 3 months and prn.

## 2012-10-27 NOTE — Progress Notes (Signed)
Subjective:    Patient ID: Caroline Gray, female    DOB: May 11, 1946, 66 y.o.   MRN: [id number]  HPI 66YO female with DM, psoriasis, irritable bowel syndrome, diverticulitis presents for followup. In regards to psoriasis, she reports persistent symptoms of peeling and pain over her palms and soles. She was seen by her dermatologist but no recent medication changes have been made. She continues on Humira. Over her right medial foot she is concerned about an area of redness that is occasionally warm to touch. She is concerned about infection. She denies any drainage from the area, fever, chills.  In regards to  irritable bowel syndrome, she reports that symptoms have been. Well-controlled over the last few months. She denies any abdominal pain, diarrhea, constipation, blood in her stool.  In regards to diabetes, she reports that blood sugars have been better controlled however fasting sugars still near 130 to 150. She reports full compliance with her medications. She had one low BG<60,which was symptomatic with diaphoresis.  Outpatient Encounter Prescriptions as of 10/27/2012  Medication Sig Dispense Refill  . adalimumab (HUMIRA) 40 MG/0.8ML injection Inject 40 mg into the skin once a week.        . Calcium Citrate-Vitamin D (CALCIUM CITRATE + D PO) Take 1 capsule by mouth 2 (two) times daily.        . clobetasol (TEMOVATE) 0.05 % cream Apply 1 application topically 2 (two) times daily.        Marland Kitchen FLUoxetine (PROZAC) 20 MG tablet Take 1 tablet (20 mg total) by mouth daily.  30 tablet  3  . gabapentin (NEURONTIN) 300 MG capsule Take 1 capsule (300 mg total) by mouth 3 (three) times daily.  90 capsule  11  . glipiZIDE (GLUCOTROL XL) 2.5 MG 24 hr tablet Take 2 tablets by mouth in morning and 1 tablet by mouth in the evening  90 tablet  11  . HYDROcodone-acetaminophen (NORCO/VICODIN) 5-325 MG per tablet       . hyoscyamine (LEVSIN/SL) 0.125 MG SL tablet 1-2 tablets by mouth every 4 hours as needed for pain  100  tablet  5  . LOCOID LIPOCREAM 0.1 % CREA       . metFORMIN (GLUCOPHAGE) 1000 MG tablet TAKE ONE TABLET BY MOUTH ONE TIME DAILY  90 tablet  2  . Multiple Vitamin (MULTIVITAMIN) capsule Take 1 capsule by mouth daily.        . ONE TOUCH ULTRA TEST test strip CHECK BLOOD SUGAR TWICE DAILY  50 each  10  . simvastatin (ZOCOR) 40 MG tablet TAKE ONE TABLET BY MOUTH NIGHTLY AT BEDTIME  90 tablet  1  . urea (CARMOL) 40 % CREA as directed.       . sulfamethoxazole-trimethoprim (BACTRIM DS) 800-160 MG per tablet Take 1 tablet by mouth 2 (two) times daily.  28 tablet  0  . [DISCONTINUED] acitretin (SORIATANE) 25 MG capsule Take 25 mg by mouth daily.        BP 138/80  Pulse 78  Temp 98.3 F (36.8 C) (Oral)  Resp 16  Wt 169 lb 4 oz (76.771 kg)  Review of Systems  Constitutional: Negative for fever, chills, appetite change, fatigue and unexpected weight change.  HENT: Negative for ear pain, congestion, sore throat, trouble swallowing, neck pain, voice change and sinus pressure.   Eyes: Negative for visual disturbance.  Respiratory: Negative for cough, shortness of breath, wheezing and stridor.   Cardiovascular: Negative for chest pain, palpitations and leg swelling.  Gastrointestinal: Negative  for nausea, vomiting, abdominal pain, diarrhea, constipation, blood in stool, abdominal distention and anal bleeding.  Genitourinary: Negative for dysuria and flank pain.  Musculoskeletal: Negative for myalgias, arthralgias and gait problem.  Skin: Positive for color change, rash and wound.  Neurological: Negative for dizziness and headaches.  Hematological: Negative for adenopathy. Does not bruise/bleed easily.  Psychiatric/Behavioral: Negative for suicidal ideas, sleep disturbance and dysphoric mood. The patient is not nervous/anxious.        Objective:   Physical Exam  Constitutional: She is oriented to person, place, and time. She appears well-developed and well-nourished. No distress.  HENT:  Head:  Normocephalic and atraumatic.  Right Ear: External ear normal.  Left Ear: External ear normal.  Nose: Nose normal.  Mouth/Throat: Oropharynx is clear and moist. No oropharyngeal exudate.  Eyes: Conjunctivae normal are normal. Pupils are equal, round, and reactive to light. Right eye exhibits no discharge. Left eye exhibits no discharge. No scleral icterus.  Neck: Normal range of motion. Neck supple. No tracheal deviation present. No thyromegaly present.  Cardiovascular: Normal rate, regular rhythm, normal heart sounds and intact distal pulses.  Exam reveals no gallop and no friction rub.   No murmur heard. Pulmonary/Chest: Effort normal and breath sounds normal. No respiratory distress. She has no wheezes. She has no rales. She exhibits no tenderness.  Abdominal: Soft. Bowel sounds are normal. She exhibits no distension and no mass. There is no tenderness. There is no rebound and no guarding.  Musculoskeletal: Normal range of motion. She exhibits no edema and no tenderness.  Lymphadenopathy:    She has no cervical adenopathy.  Neurological: She is alert and oriented to person, place, and time. No cranial nerve deficit. She exhibits normal muscle tone. Coordination normal.  Skin: Skin is warm and dry. Rash noted. Rash is papular (peeling plaques over palms and soles, with erythema right medial midfoot). She is not diaphoretic. There is erythema. No pallor.  Psychiatric: She has a normal mood and affect. Her behavior is normal. Judgment and thought content normal.          Assessment & Plan:

## 2012-10-27 NOTE — Assessment & Plan Note (Signed)
BG have been improved over last few weeks, per pt report. Will recheck A1c with labs today. Discussed stopping Glipizide if any recurrent low BG <60.  Follow up 3 months and prn.

## 2012-10-27 NOTE — Assessment & Plan Note (Signed)
Symptoms are persistent. Pt followed at [facility] dermatology, and for now, continues on Humira. Will continue to monitor.

## 2012-10-29 ENCOUNTER — Ambulatory Visit: Payer: Self-pay | Admitting: Internal Medicine

## 2012-11-04 ENCOUNTER — Other Ambulatory Visit: Payer: Self-pay | Admitting: Internal Medicine

## 2012-11-04 NOTE — Telephone Encounter (Signed)
Med filled.  

## 2012-11-12 ENCOUNTER — Encounter: Payer: Self-pay | Admitting: Internal Medicine

## 2012-11-12 LAB — HM MAMMOGRAPHY: HM Mammogram: NORMAL

## 2012-11-26 ENCOUNTER — Ambulatory Visit: Payer: Self-pay | Admitting: Urology

## 2012-11-27 ENCOUNTER — Ambulatory Visit (INDEPENDENT_AMBULATORY_CARE_PROVIDER_SITE_OTHER): Payer: Medicare PPO | Admitting: *Deleted

## 2012-11-27 DIAGNOSIS — E538 Deficiency of other specified B group vitamins: Secondary | ICD-10-CM

## 2012-11-27 MED ORDER — CYANOCOBALAMIN 1000 MCG/ML IJ SOLN
1000.0000 ug | Freq: Once | INTRAMUSCULAR | Status: AC
  Administered 2012-11-27: 1000 ug via INTRAMUSCULAR

## 2012-12-22 ENCOUNTER — Encounter: Payer: Self-pay | Admitting: Internal Medicine

## 2012-12-22 MED ORDER — GLIPIZIDE ER 5 MG PO TB24: 5.0000 mg | ORAL_TABLET | Freq: Every day | ORAL | Status: DC

## 2012-12-29 ENCOUNTER — Ambulatory Visit: Payer: Medicare PPO

## 2013-01-02 ENCOUNTER — Other Ambulatory Visit: Payer: Self-pay

## 2013-01-09 ENCOUNTER — Other Ambulatory Visit: Payer: Self-pay | Admitting: Internal Medicine

## 2013-01-13 ENCOUNTER — Ambulatory Visit (INDEPENDENT_AMBULATORY_CARE_PROVIDER_SITE_OTHER): Payer: Medicare PPO | Admitting: *Deleted

## 2013-01-13 MED ORDER — CYANOCOBALAMIN 1000 MCG/ML IJ SOLN
1000.0000 ug | Freq: Once | INTRAMUSCULAR | Status: AC
  Administered 2013-01-13: 1000 ug via INTRAMUSCULAR

## 2013-01-14 MED ORDER — GLIPIZIDE ER 10 MG PO TB24: 10.0000 mg | ORAL_TABLET | Freq: Every day | ORAL | Status: DC

## 2013-01-18 ENCOUNTER — Other Ambulatory Visit: Payer: Self-pay | Admitting: Internal Medicine

## 2013-01-19 ENCOUNTER — Other Ambulatory Visit: Payer: Self-pay | Admitting: Internal Medicine

## 2013-01-29 ENCOUNTER — Encounter: Payer: Self-pay | Admitting: Internal Medicine

## 2013-01-29 ENCOUNTER — Ambulatory Visit (INDEPENDENT_AMBULATORY_CARE_PROVIDER_SITE_OTHER): Payer: Medicare PPO | Admitting: Internal Medicine

## 2013-01-29 VITALS — BP 132/82 | HR 78 | Temp 97.8°F | Wt 171.0 lb

## 2013-01-29 DIAGNOSIS — E785 Hyperlipidemia, unspecified: Secondary | ICD-10-CM

## 2013-01-29 DIAGNOSIS — L409 Psoriasis, unspecified: Secondary | ICD-10-CM

## 2013-01-29 DIAGNOSIS — E119 Type 2 diabetes mellitus without complications: Secondary | ICD-10-CM

## 2013-01-29 DIAGNOSIS — K589 Irritable bowel syndrome without diarrhea: Secondary | ICD-10-CM

## 2013-01-29 DIAGNOSIS — R5381 Other malaise: Secondary | ICD-10-CM

## 2013-01-29 LAB — COMPREHENSIVE METABOLIC PANEL
Albumin: 4.1 g/dL (ref 3.5–5.2)
Alkaline Phosphatase: 63 U/L (ref 39–117)
BUN: 13 mg/dL (ref 6–23)
CO2: 30 mEq/L (ref 19–32)
Calcium: 10 mg/dL (ref 8.4–10.5)
Chloride: 101 mEq/L (ref 96–112)
GFR: 60.53 mL/min (ref >60.00)
Glucose, Bld: 97 mg/dL (ref 70–99)
Potassium: 4.4 mEq/L (ref 3.5–5.1)
Sodium: 139 mEq/L (ref 135–145)
Total Protein: 8 g/dL (ref 6.0–8.3)

## 2013-01-29 LAB — HEMOGLOBIN A1C: Hgb A1c MFr Bld: 7 % — ABNORMAL HIGH (ref 4.6–6.5)

## 2013-01-29 MED ORDER — ACYCLOVIR 400 MG PO TABS: 400.0000 mg | ORAL_TABLET | Freq: Two times a day (BID) | ORAL | Status: DC

## 2013-01-29 NOTE — Assessment & Plan Note (Signed)
Likely multifactorial with ongoing issues related to psoriasis and treatment of psoriasis as well as diabetes. Will check CMP and A1c with labs today. We'll continue B12 supplementation. We discussed potentially getting a sleep study the patient like to hold off for now. If no improvement after change in medications for psoriasis, consider sleep study in the future.

## 2013-01-29 NOTE — Progress Notes (Signed)
Subjective:    Patient ID: Caroline Gray, female    DOB: [birth year], 67 y.o.   MRN: [id number]  HPI 67 year old female with history of psoriasis, diabetes, hyperlipidemia presents for followup. She reports that her psoriasis has been progressive despite use of Humira. She was seen by her dermatologist and is scheduled to start experimental drug in the next month. She continues to have peeling and redness over her palms and soles which is occasionally painful.  In regards to diabetes, she reports that blood sugars have been quite variable. She has increased glipizide to 10 mg daily with no improvement. She denies any low blood sugars less than 60. She has been trying to follow a healthy diet. Exercise has been limited somewhat because of fatigue and because of skin lesions and skin irritation.  She reports feeling generally exhausted even after a full night sleep. She sometimes does wake during the night. She has never taken medications to help with sleep. She prefers not to add additional medications at this time. She denies any focal symptoms of change in appetite, change in weight, change in bowel habits, chest pain, dyspnea.  Outpatient Encounter Prescriptions as of 01/29/2013  Medication Sig Dispense Refill  . acyclovir (ZOVIRAX) 400 MG tablet Take 1 tablet (400 mg total) by mouth 2 (two) times daily.  60 tablet  3  . adalimumab (HUMIRA) 40 MG/0.8ML injection Inject 40 mg into the skin once a week.        . Calcium Citrate-Vitamin D (CALCIUM CITRATE + D PO) Take 1 capsule by mouth 2 (two) times daily.        . clobetasol (TEMOVATE) 0.05 % cream Apply 1 application topically 2 (two) times daily.        Marland Kitchen FLUoxetine (PROZAC) 20 MG tablet TAKE ONE TABLET BY MOUTH ONE TIME DAILY  30 tablet  2  . gabapentin (NEURONTIN) 300 MG capsule TAKE ONE CAPSULE BY MOUTH THREE TIMES DAILY  90 capsule  10  . glipiZIDE (GLUCOTROL XL) 10 MG 24 hr tablet Take 1 tablet (10 mg total) by mouth daily.  30 tablet  6  .  LOCOID LIPOCREAM 0.1 % CREA       . metFORMIN (GLUCOPHAGE) 1000 MG tablet TAKE ONE TABLET BY MOUTH ONE TIME DAILY  90 tablet  1  . Multiple Vitamin (MULTIVITAMIN) capsule Take 1 capsule by mouth daily.        . ONE TOUCH ULTRA TEST test strip CHECK BLOOD SUGAR TWICE DAILY  50 each  10  . simvastatin (ZOCOR) 40 MG tablet TAKE ONE TABLET BY MOUTH NIGHTLY AT BEDTIME  90 tablet  1    BP 132/82  Pulse 78  Temp(Src) 97.8 F (36.6 C) (Oral)  Wt 171 lb (77.565 kg)  BMI 30.3 kg/m2  SpO2 94%  Review of Systems  Constitutional: Positive for fatigue. Negative for fever, chills, appetite change and unexpected weight change.  HENT: Negative for ear pain, congestion, sore throat, trouble swallowing, neck pain, voice change and sinus pressure.   Eyes: Negative for visual disturbance.  Respiratory: Negative for cough, shortness of breath, wheezing and stridor.   Cardiovascular: Negative for chest pain, palpitations and leg swelling.  Gastrointestinal: Negative for nausea, vomiting, abdominal pain, diarrhea, constipation, blood in stool, abdominal distention and anal bleeding.  Genitourinary: Negative for dysuria and flank pain.  Musculoskeletal: Negative for myalgias, arthralgias and gait problem.  Skin: Positive for rash. Negative for color change.  Neurological: Negative for dizziness and headaches.  Hematological: Negative for  adenopathy. Does not bruise/bleed easily.  Psychiatric/Behavioral: Negative for suicidal ideas, sleep disturbance and dysphoric mood. The patient is not nervous/anxious.        Objective:   Physical Exam  Constitutional: She is oriented to person, place, and time. She appears well-developed and well-nourished. No distress.  HENT:  Head: Normocephalic and atraumatic.  Right Ear: External ear normal.  Left Ear: External ear normal.  Nose: Nose normal.  Mouth/Throat: Oropharynx is clear and moist. No oropharyngeal exudate.  Eyes: Conjunctivae are normal. Pupils are  equal, round, and reactive to light. Right eye exhibits no discharge. Left eye exhibits no discharge. No scleral icterus.  Neck: Normal range of motion. Neck supple. No tracheal deviation present. No thyromegaly present.  Cardiovascular: Normal rate, regular rhythm, normal heart sounds and intact distal pulses.  Exam reveals no gallop and no friction rub.   No murmur heard. Pulmonary/Chest: Effort normal and breath sounds normal. No respiratory distress. She has no wheezes. She has no rales. She exhibits no tenderness.  Musculoskeletal: Normal range of motion. She exhibits no edema and no tenderness.  Lymphadenopathy:    She has no cervical adenopathy.  Neurological: She is alert and oriented to person, place, and time. No cranial nerve deficit. She exhibits normal muscle tone. Coordination normal.  Skin: Skin is warm and dry. Rash noted. Rash is pustular (with scaling over palms and soles). She is not diaphoretic. There is erythema. No pallor.  Psychiatric: She has a normal mood and affect. Her behavior is normal. Judgment and thought content normal.          Assessment & Plan:

## 2013-01-29 NOTE — Assessment & Plan Note (Signed)
Progress of psoriasis over palms and soles despite use of Humira and topical steroid creams. Patient has also failed use of methotrexate and prednisone in the past. She reports that her dermatologist is getting approval for experimental drug to treat her psoriasis. Will obtain records from [facility].

## 2013-01-29 NOTE — Assessment & Plan Note (Signed)
Patient reports blood sugars have been quite variable despite increase in Glipizide. Will check A1c with labs today. Discussed potentially starting new medication such as Januvia if A1c continues to be elevated.

## 2013-01-29 NOTE — Assessment & Plan Note (Signed)
Symptoms of left lower quadrant pain have resolved with use of probiotic and intermittent use of Levsin. Will continue.

## 2013-02-24 ENCOUNTER — Ambulatory Visit: Payer: Self-pay | Admitting: Urology

## 2013-03-30 ENCOUNTER — Ambulatory Visit (INDEPENDENT_AMBULATORY_CARE_PROVIDER_SITE_OTHER): Payer: Medicare PPO | Admitting: *Deleted

## 2013-03-30 DIAGNOSIS — E538 Deficiency of other specified B group vitamins: Secondary | ICD-10-CM

## 2013-04-19 ENCOUNTER — Other Ambulatory Visit: Payer: Self-pay | Admitting: Internal Medicine

## 2013-04-19 NOTE — Telephone Encounter (Signed)
Eprescribed.

## 2013-05-11 ENCOUNTER — Ambulatory Visit (INDEPENDENT_AMBULATORY_CARE_PROVIDER_SITE_OTHER): Payer: Medicare PPO | Admitting: *Deleted

## 2013-05-11 DIAGNOSIS — E538 Deficiency of other specified B group vitamins: Secondary | ICD-10-CM

## 2013-05-11 MED ORDER — CYANOCOBALAMIN 1000 MCG/ML IJ SOLN
1000.0000 ug | Freq: Once | INTRAMUSCULAR | Status: AC
  Administered 2013-05-11: 1000 ug via INTRAMUSCULAR

## 2013-05-18 LAB — HM DIABETES EYE EXAM

## 2013-06-04 ENCOUNTER — Other Ambulatory Visit: Payer: Self-pay | Admitting: Internal Medicine

## 2013-06-04 NOTE — Telephone Encounter (Signed)
Eprescribed.

## 2013-06-11 ENCOUNTER — Ambulatory Visit (INDEPENDENT_AMBULATORY_CARE_PROVIDER_SITE_OTHER): Payer: Medicare PPO | Admitting: *Deleted

## 2013-06-11 DIAGNOSIS — E538 Deficiency of other specified B group vitamins: Secondary | ICD-10-CM

## 2013-06-11 MED ORDER — CYANOCOBALAMIN 1000 MCG/ML IJ SOLN
1000.0000 ug | Freq: Once | INTRAMUSCULAR | Status: AC
  Administered 2013-06-11: 1000 ug via INTRAMUSCULAR

## 2013-06-15 ENCOUNTER — Ambulatory Visit: Payer: Medicare PPO

## 2013-06-23 ENCOUNTER — Other Ambulatory Visit: Payer: Self-pay

## 2013-06-24 ENCOUNTER — Ambulatory Visit (INDEPENDENT_AMBULATORY_CARE_PROVIDER_SITE_OTHER): Payer: Medicare PPO | Admitting: Adult Health

## 2013-06-24 ENCOUNTER — Encounter: Payer: Self-pay | Admitting: Adult Health

## 2013-06-24 VITALS — BP 130/60 | HR 90 | Temp 97.6°F | Resp 12 | Wt 178.0 lb

## 2013-06-24 DIAGNOSIS — M5430 Sciatica, unspecified side: Secondary | ICD-10-CM | POA: Insufficient documentation

## 2013-06-24 DIAGNOSIS — M5432 Sciatica, left side: Secondary | ICD-10-CM

## 2013-06-24 MED ORDER — PREDNISONE 10 MG PO TABS: ORAL_TABLET | ORAL | Status: DC

## 2013-06-24 MED ORDER — HYDROCODONE-ACETAMINOPHEN 5-325 MG PO TABS: 1.0000 | ORAL_TABLET | Freq: Four times a day (QID) | ORAL | Status: DC | PRN

## 2013-06-24 MED ORDER — CYCLOBENZAPRINE HCL 5 MG PO TABS: 5.0000 mg | ORAL_TABLET | Freq: Three times a day (TID) | ORAL | Status: DC | PRN

## 2013-06-24 NOTE — Progress Notes (Signed)
  Subjective:    Patient ID: Caroline Gray, female    DOB: Oct 28, 1946, 67 y.o.   MRN: [id number]  HPI  Pt presents to clinic with sciatic nerve pain. Her pain that radiates down the left buttocks and leg. It started approximately one week ago shortly after having cataract surgery. She reports that she spends most of the time sitting and this has been a trigger for her sciatic pain. She has a trip scheduled to [place] in a few weeks and is hoping to be improved by then.   Current Outpatient Prescriptions on File Prior to Visit  Medication Sig Dispense Refill  . acyclovir (ZOVIRAX) 400 MG tablet Take 1 tablet (400 mg total) by mouth 2 (two) times daily.  60 tablet  3  . Calcium Citrate-Vitamin D (CALCIUM CITRATE + D PO) Take 1 capsule by mouth 2 (two) times daily.        . clobetasol (TEMOVATE) 0.05 % cream Apply 1 application topically 2 (two) times daily.        Marland Kitchen FLUoxetine (PROZAC) 20 MG tablet TAKE ONE TABLET BY MOUTH ONE TIME DAILY  30 tablet  2  . gabapentin (NEURONTIN) 300 MG capsule TAKE ONE CAPSULE BY MOUTH THREE TIMES DAILY  90 capsule  10  . glipiZIDE (GLUCOTROL XL) 10 MG 24 hr tablet Take 1 tablet (10 mg total) by mouth daily.  30 tablet  6  . LOCOID LIPOCREAM 0.1 % CREA       . metFORMIN (GLUCOPHAGE) 1000 MG tablet TAKE ONE TABLET BY MOUTH ONE TIME DAILY  90 tablet  1  . Multiple Vitamin (MULTIVITAMIN) capsule Take 1 capsule by mouth daily.        . ONE TOUCH ULTRA TEST test strip TEST TWICE A DAY  50 each  0  . simvastatin (ZOCOR) 40 MG tablet TAKE ONE TABLET BY MOUTH   NIGHTLY AT BEDTIME  90 tablet  0  . urea (CARMOL) 40 % CREA as directed.        No current facility-administered medications on file prior to visit.    Review of Systems  Musculoskeletal: Positive for back pain.       Low back with radiation down left buttocks and leg       Objective:   Physical Exam  Constitutional: She is oriented to person, place, and time.  Musculoskeletal:  Difficulty with moving from  sitting to standing position. Steady gait.  Neurological: She is alert and oriented to person, place, and time. Coordination normal.  Psychiatric: She has a normal mood and affect. Her behavior is normal. Judgment and thought content normal.          Assessment & Plan:

## 2013-06-24 NOTE — Assessment & Plan Note (Addendum)
We will start a prednisone taper 60 mg on day one and decrease by 10 mg daily. Norco for moderate to severe pain. Apply ice to the area for 15 minutes. It may alternate with heat. Do this 3-4 times a day. Stretch piriformis muscle as instructed. Use firm pillow between legs when lying on side. RTC if not improvement within 1-2 weeks or sooner if needed.

## 2013-06-24 NOTE — Patient Instructions (Addendum)
  Start your prednisone taper as we discussed. 6 tablets on the first day and decrease by 1 tablet daily until done.  Also take flexeril for muscle spasms. This will make you sleepy so only take this at bedtime when you are working.  Apply ice alternating with heat to the affected areas for 15 min at a time. Do this approximately 3-4 times daily.  Use a firm pillow between your knees when you lie on your side or under your knees when you lie on your back.  Return to clinic if your symptoms are not improving within 4 weeks or sooner if your symptoms worsen.  Have a great time in [place]!!!

## 2013-06-28 ENCOUNTER — Telehealth: Payer: Self-pay | Admitting: Internal Medicine

## 2013-06-28 NOTE — Telephone Encounter (Signed)
Patient informed and verbally agreed understanding.  

## 2013-06-28 NOTE — Telephone Encounter (Signed)
Fwd to Dr. Walker 

## 2013-06-28 NOTE — Telephone Encounter (Signed)
She is already on the max dose of Glipizide. I would recommend just monitoring for now. If BG consistently >250 over next 1-2 days, we could add low dose of insulin.

## 2013-06-28 NOTE — Telephone Encounter (Signed)
Patient Information:  Caller Name: Jeylin  Phone: [phone]  Patient: Caroline Gray  Gender: Female  DOB: [birth year]  Age: 67 Years  PCP: Ronna Polio (Adults only)  Office Follow Up:  Does the office need to follow up with this patient?: No  Instructions For The Office: N/A   Symptoms  Reason For Call & Symptoms: Seen in the office on 06/24/2013 for Sciatic pain and was given Hydrocodone and Prednisone.  She is diabetic and she has had 2 high blood sugars.  Her blood sugar is 367 now.  Her normal is 135-170.  She wants to know if she should take additional Metformin or Glipizide.  She finishes the Prednisone in 2 days.  Nerve pain is better.  Blood sugar this AM was within normal range.  Reviewed Health History In EMR: Yes  Reviewed Medications In EMR: Yes  Reviewed Allergies In EMR: Yes  Reviewed Surgeries / Procedures: Yes  Date of Onset of Symptoms: 06/24/2013  Guideline(s) Used:  Diabetes - High Blood Sugar  Disposition Per Guideline:   Home Care  Reason For Disposition Reached:   Blood glucose > 240 mg/dl (13 mmol/l)  Advice Given:  General  Symptoms of severe hyperglycemia: weakness, progressing to confusion and coma.  Treatment - Diabetes Medications  : Continue taking your diabetes pills.  Expected Course  Your blood sugar continues to get above 240 mg/dl (13 mmol/l).  Call Back If:  Blood glucose more than 300 mg/dL (09.8 mmol/l), 2 or more times in a row.  Vomiting lasting more than 4 hours or unable to drink any liquids.  Call Back If:  Blood glucose more than 300 mg/dL (11.9 mmol/l), 2 or more times in a row.  Vomiting lasting more than 4 hours or unable to drink any liquids.  You become worse.  Liquids  Drink more fluids, at least 8-10 glasses daily (8 oz or 240 ml each glass).  Patient Will Follow Care Advice:  YES

## 2013-07-12 ENCOUNTER — Encounter: Payer: Self-pay | Admitting: *Deleted

## 2013-07-12 ENCOUNTER — Encounter: Payer: Self-pay | Admitting: Internal Medicine

## 2013-07-13 ENCOUNTER — Encounter: Payer: Self-pay | Admitting: *Deleted

## 2013-07-13 ENCOUNTER — Other Ambulatory Visit: Payer: Self-pay | Admitting: Internal Medicine

## 2013-07-13 ENCOUNTER — Ambulatory Visit (INDEPENDENT_AMBULATORY_CARE_PROVIDER_SITE_OTHER): Payer: Medicare PPO | Admitting: *Deleted

## 2013-07-13 DIAGNOSIS — E538 Deficiency of other specified B group vitamins: Secondary | ICD-10-CM

## 2013-07-13 MED ORDER — CYANOCOBALAMIN 1000 MCG/ML IJ SOLN
1000.0000 ug | Freq: Once | INTRAMUSCULAR | Status: AC
  Administered 2013-07-13: 1000 ug via INTRAMUSCULAR

## 2013-08-05 ENCOUNTER — Encounter: Payer: Self-pay | Admitting: *Deleted

## 2013-08-06 ENCOUNTER — Ambulatory Visit (INDEPENDENT_AMBULATORY_CARE_PROVIDER_SITE_OTHER): Payer: Medicare PPO | Admitting: Internal Medicine

## 2013-08-06 ENCOUNTER — Encounter: Payer: Self-pay | Admitting: Internal Medicine

## 2013-08-06 VITALS — BP 130/70 | HR 94 | Temp 97.8°F | Wt 176.0 lb

## 2013-08-06 DIAGNOSIS — M5430 Sciatica, unspecified side: Secondary | ICD-10-CM

## 2013-08-06 DIAGNOSIS — Z8669 Personal history of other diseases of the nervous system and sense organs: Secondary | ICD-10-CM

## 2013-08-06 DIAGNOSIS — L408 Other psoriasis: Secondary | ICD-10-CM

## 2013-08-06 DIAGNOSIS — E785 Hyperlipidemia, unspecified: Secondary | ICD-10-CM

## 2013-08-06 DIAGNOSIS — L409 Psoriasis, unspecified: Secondary | ICD-10-CM

## 2013-08-06 DIAGNOSIS — E119 Type 2 diabetes mellitus without complications: Secondary | ICD-10-CM

## 2013-08-06 DIAGNOSIS — Z780 Asymptomatic menopausal state: Secondary | ICD-10-CM

## 2013-08-06 LAB — LIPID PANEL
Cholesterol: 139 mg/dL (ref 0–200)
Triglycerides: 191 mg/dL — ABNORMAL HIGH (ref 0.0–149.0)

## 2013-08-06 LAB — COMPREHENSIVE METABOLIC PANEL
Albumin: 3.8 g/dL (ref 3.5–5.2)
Alkaline Phosphatase: 69 U/L (ref 39–117)
BUN: 12 mg/dL (ref 6–23)
Calcium: 8.9 mg/dL (ref 8.4–10.5)
Chloride: 104 mEq/L (ref 96–112)
Glucose, Bld: 281 mg/dL — ABNORMAL HIGH (ref 70–99)
Potassium: 4.2 mEq/L (ref 3.5–5.1)

## 2013-08-06 LAB — MICROALBUMIN / CREATININE URINE RATIO: Microalb Creat Ratio: 2.6 mg/g (ref 0.0–30.0)

## 2013-08-06 MED ORDER — SIMVASTATIN 40 MG PO TABS: ORAL_TABLET | ORAL | Status: DC

## 2013-08-06 MED ORDER — GLIPIZIDE ER 10 MG PO TB24: 10.0000 mg | ORAL_TABLET | Freq: Every day | ORAL | Status: DC

## 2013-08-06 NOTE — Assessment & Plan Note (Signed)
Will check lipids and LFTs with labs today. Continue simvastatin. 

## 2013-08-06 NOTE — Assessment & Plan Note (Signed)
Symptoms are poorly controlled on current medications. Patient has followup with dermatology next month. We'll continue to monitor.

## 2013-08-06 NOTE — Assessment & Plan Note (Signed)
Recent symptoms of sciatic pain have resolved. Discussed potential referral to sports medicine for local steroid injection if any recurrence of symptoms.

## 2013-08-06 NOTE — Progress Notes (Signed)
Subjective:    Patient ID: Caroline Gray, female    DOB: Oct 04, 1946, 67 y.o.   MRN: [id number]  HPI 67 year old female with history of diabetes, hyperlipidemia, psoriasis presents for followup. In regards to diabetes, she reports blood sugars have been quite variable given thatl she has been on several recent course of prednisone. However, recently over last couple of weeks blood sugars have been well-controlled with glipizide. She did not bring record of blood sugars with her today.  In regards to psoriasis, she reports that symptoms have been persistent with peeling over her palms and soles despite recent use of Stelara. She is planning to followup with her dermatologist next month.  She was recently evaluated for sciatic pain. She completed a course of prednisone. She reports symptoms have resolved.  She denies any new concerns today.  Outpatient Encounter Prescriptions as of 08/06/2013  Medication Sig Dispense Refill  . acyclovir (ZOVIRAX) 400 MG tablet Take 1 tablet (400 mg total) by mouth 2 (two) times daily.  60 tablet  3  . Calcium Citrate-Vitamin D (CALCIUM CITRATE + D PO) Take 1 capsule by mouth 2 (two) times daily.        . cyclobenzaprine (FLEXERIL) 5 MG tablet Take 1 tablet (5 mg total) by mouth 3 (three) times daily as needed for muscle spasms.  30 tablet  1  . FLUoxetine (PROZAC) 20 MG tablet TAKE ONE TABLET BY MOUTH ONE TIME DAILY  30 tablet  2  . gabapentin (NEURONTIN) 300 MG capsule TAKE ONE CAPSULE BY MOUTH THREE TIMES DAILY  90 capsule  10  . glipiZIDE (GLUCOTROL XL) 10 MG 24 hr tablet Take 1 tablet (10 mg total) by mouth daily.  90 tablet  3  . HYDROcodone-acetaminophen (NORCO/VICODIN) 5-325 MG per tablet Take 1 tablet by mouth every 6 (six) hours as needed for pain.  30 tablet  0  . metFORMIN (GLUCOPHAGE) 1000 MG tablet Take one tablet by mouth one time daily  90 tablet  0  . Multiple Vitamin (MULTIVITAMIN) capsule Take 1 capsule by mouth daily.        . ONE TOUCH ULTRA TEST  test strip TEST TWICE A DAY  50 each  0  . simvastatin (ZOCOR) 40 MG tablet TAKE ONE TABLET BY MOUTH   NIGHTLY AT BEDTIME  90 tablet  3  . STELARA 90 MG/ML SOLN Injection as directed      . LOCOID LIPOCREAM 0.1 % CREA       . urea (CARMOL) 40 % CREA as directed.        No facility-administered encounter medications on file as of 08/06/2013.   BP 130/70  Pulse 94  Temp(Src) 97.8 F (36.6 C) (Oral)  Wt 176 lb (79.833 kg)  BMI 31.18 kg/m2  SpO2 94%  Review of Systems  Constitutional: Negative for fever, chills, appetite change, fatigue and unexpected weight change.  HENT: Negative for ear pain, congestion, sore throat, trouble swallowing, neck pain, voice change and sinus pressure.   Eyes: Negative for visual disturbance.  Respiratory: Negative for cough, shortness of breath, wheezing and stridor.   Cardiovascular: Negative for chest pain, palpitations and leg swelling.  Gastrointestinal: Negative for nausea, vomiting, abdominal pain, diarrhea, constipation, blood in stool, abdominal distention and anal bleeding.  Genitourinary: Negative for dysuria and flank pain.  Musculoskeletal: Negative for myalgias, arthralgias and gait problem.  Skin: Positive for color change and rash.  Neurological: Negative for dizziness and headaches.  Hematological: Negative for adenopathy. Does not bruise/bleed easily.  Psychiatric/Behavioral: Negative for suicidal ideas, sleep disturbance and dysphoric mood. The patient is not nervous/anxious.        Objective:   Physical Exam  Constitutional: She is oriented to person, place, and time. She appears well-developed and well-nourished. No distress.  HENT:  Head: Normocephalic and atraumatic.  Right Ear: External ear normal.  Left Ear: External ear normal.  Nose: Nose normal.  Mouth/Throat: Oropharynx is clear and moist. No oropharyngeal exudate.  Eyes: Conjunctivae are normal. Pupils are equal, round, and reactive to light. Right eye exhibits no  discharge. Left eye exhibits no discharge. No scleral icterus.  Neck: Normal range of motion. Neck supple. No tracheal deviation present. No thyromegaly present.  Cardiovascular: Normal rate, regular rhythm, normal heart sounds and intact distal pulses.  Exam reveals no gallop and no friction rub.   No murmur heard. Pulmonary/Chest: Effort normal and breath sounds normal. No accessory muscle usage. Not tachypneic. No respiratory distress. She has no decreased breath sounds. She has no wheezes. She has no rhonchi. She has no rales. She exhibits no tenderness.  Abdominal: Soft. Bowel sounds are normal. She exhibits no distension and no mass. There is no tenderness. There is no rebound and no guarding.  Musculoskeletal: Normal range of motion. She exhibits no edema and no tenderness.  Lymphadenopathy:    She has no cervical adenopathy.  Neurological: She is alert and oriented to person, place, and time. No cranial nerve deficit. She exhibits normal muscle tone. Coordination normal.  Skin: Skin is warm and dry. Rash (scaling rash over palms and soles, consistent with known psoriasis) noted. She is not diaphoretic. There is erythema. No pallor.  Psychiatric: She has a normal mood and affect. Her behavior is normal. Judgment and thought content normal.          Assessment & Plan:

## 2013-08-06 NOTE — Assessment & Plan Note (Signed)
Patient reports blood sugars have been quite variable given recent use of prednisone. Will check A1c with labs today. Foot exam today remarkable for diffuse scaling consistent with psoriasis. No open wounds noted.

## 2013-08-08 ENCOUNTER — Encounter: Payer: Self-pay | Admitting: Internal Medicine

## 2013-08-09 MED ORDER — SAXAGLIPTIN HCL 2.5 MG PO TABS: 2.5000 mg | ORAL_TABLET | Freq: Every day | ORAL | Status: DC

## 2013-08-17 ENCOUNTER — Ambulatory Visit (INDEPENDENT_AMBULATORY_CARE_PROVIDER_SITE_OTHER): Payer: Medicare PPO | Admitting: *Deleted

## 2013-08-17 DIAGNOSIS — E538 Deficiency of other specified B group vitamins: Secondary | ICD-10-CM

## 2013-08-17 MED ORDER — CYANOCOBALAMIN 1000 MCG/ML IJ SOLN
1000.0000 ug | Freq: Once | INTRAMUSCULAR | Status: AC
  Administered 2013-08-17: 1000 ug via INTRAMUSCULAR

## 2013-09-22 ENCOUNTER — Ambulatory Visit (INDEPENDENT_AMBULATORY_CARE_PROVIDER_SITE_OTHER): Payer: Medicare PPO | Admitting: *Deleted

## 2013-09-22 DIAGNOSIS — E538 Deficiency of other specified B group vitamins: Secondary | ICD-10-CM

## 2013-09-22 MED ORDER — CYANOCOBALAMIN 1000 MCG/ML IJ SOLN
1000.0000 ug | Freq: Once | INTRAMUSCULAR | Status: AC
  Administered 2013-09-22: 1000 ug via INTRAMUSCULAR

## 2013-09-23 ENCOUNTER — Other Ambulatory Visit: Payer: Self-pay

## 2013-10-30 LAB — HM MAMMOGRAPHY: HM Mammogram: NORMAL

## 2013-11-05 ENCOUNTER — Encounter: Payer: Self-pay | Admitting: Internal Medicine

## 2013-11-05 ENCOUNTER — Telehealth: Payer: Self-pay | Admitting: Internal Medicine

## 2013-11-05 ENCOUNTER — Ambulatory Visit (INDEPENDENT_AMBULATORY_CARE_PROVIDER_SITE_OTHER): Payer: Medicare PPO | Admitting: Internal Medicine

## 2013-11-05 VITALS — BP 130/70 | HR 98 | Temp 97.5°F | Wt 175.0 lb

## 2013-11-05 DIAGNOSIS — Z1239 Encounter for other screening for malignant neoplasm of breast: Secondary | ICD-10-CM

## 2013-11-05 DIAGNOSIS — R5383 Other fatigue: Secondary | ICD-10-CM

## 2013-11-05 DIAGNOSIS — E538 Deficiency of other specified B group vitamins: Secondary | ICD-10-CM

## 2013-11-05 DIAGNOSIS — L408 Other psoriasis: Secondary | ICD-10-CM

## 2013-11-05 DIAGNOSIS — E119 Type 2 diabetes mellitus without complications: Secondary | ICD-10-CM

## 2013-11-05 DIAGNOSIS — R5381 Other malaise: Secondary | ICD-10-CM

## 2013-11-05 DIAGNOSIS — L409 Psoriasis, unspecified: Secondary | ICD-10-CM

## 2013-11-05 LAB — LIPID PANEL
Cholesterol: 123 mg/dL (ref 0–200)
Total CHOL/HDL Ratio: 4

## 2013-11-05 LAB — MICROALBUMIN / CREATININE URINE RATIO
Creatinine,U: 66.8 mg/dL
Microalb Creat Ratio: 3.4 mg/g (ref 0.0–30.0)
Microalb, Ur: 2.3 mg/dL — ABNORMAL HIGH (ref 0.0–1.9)

## 2013-11-05 LAB — COMPREHENSIVE METABOLIC PANEL
AST: 41 U/L — ABNORMAL HIGH (ref 0–37)
Albumin: 4 g/dL (ref 3.5–5.2)
Alkaline Phosphatase: 68 U/L (ref 39–117)
CO2: 30 mEq/L (ref 19–32)
Glucose, Bld: 270 mg/dL — ABNORMAL HIGH (ref 70–99)
Potassium: 4.6 mEq/L (ref 3.5–5.1)
Sodium: 137 mEq/L (ref 135–145)
Total Protein: 7.6 g/dL (ref 6.0–8.3)

## 2013-11-05 LAB — LDL CHOLESTEROL, DIRECT: Direct LDL: 74.7 mg/dL

## 2013-11-05 MED ORDER — CYANOCOBALAMIN 1000 MCG/ML IJ SOLN
1000.0000 ug | Freq: Once | INTRAMUSCULAR | Status: AC
  Administered 2013-11-05: 1000 ug via INTRAMUSCULAR

## 2013-11-05 NOTE — Progress Notes (Signed)
Pre-visit discussion using our clinic review tool. No additional management support is needed unless otherwise documented below in the visit note.  

## 2013-11-05 NOTE — Telephone Encounter (Signed)
Pt aware of apt.  

## 2013-11-05 NOTE — Telephone Encounter (Signed)
Patient left a message on voicemail stating she was returning a call to [facility]

## 2013-11-06 ENCOUNTER — Encounter: Payer: Self-pay | Admitting: Internal Medicine

## 2013-11-06 DIAGNOSIS — Z1239 Encounter for other screening for malignant neoplasm of breast: Secondary | ICD-10-CM | POA: Insufficient documentation

## 2013-11-06 NOTE — Assessment & Plan Note (Signed)
Persistent severe symptoms of psoriasis with scaling rash on palms and soles. Followed at [facility]. Planning to start experimental form of light therapy in the future. Reviewed notes from [facility] today.

## 2013-11-06 NOTE — Assessment & Plan Note (Signed)
Mammogram ordered

## 2013-11-06 NOTE — Assessment & Plan Note (Addendum)
Generalized fatigue, likely multifactorial, no focal symptoms, with ongoing issues with psoriasis and immunosuppressive medications. Will check CMP with labs today. Continue B12 supplementation. Encouraged her to take time for herself. Consider sleep study in the future, however pt has previously declined. She noted today that her son was diagnosed with congenital malformation of the aortic root. Her cardiac exam is normal with no murmur, however will get ECHO for evaluation of LV function.

## 2013-11-06 NOTE — Progress Notes (Signed)
Subjective:    Patient ID: Caroline Gray, female    DOB: 10-16-1946, 67 y.o.   MRN: [id number]  HPI 67 year old female with history of psoriasis, diabetes, hyperlipidemia presents for followup. In regards to diabetes, she reports blood sugars have been better controlled. She has started to take the Onglyza at night because it was causing some drowsiness in the morning. She has been compliant with her medication. She denies any persistent blood sugar greater than 250 or any sugars less than 70.  In regards to her psoriasis, she continues to struggle with peeling rash over her palms and soles. She is followed at [facility]. She has had no improvement with use of methotrexate, steroid creams, and immune modulating medications. She is entering a study of new form of light therapy.  She notes some ongoing symptoms of fatigue. She continues to have some stressors at home. This is relatively unchanged. She denies any focal symptoms such as abdominal pain, change in bowel habits, chest pain, shortness of breath.  Outpatient Encounter Prescriptions as of 11/05/2013  Medication Sig  . acyclovir (ZOVIRAX) 400 MG tablet Take 1 tablet (400 mg total) by mouth 2 (two) times daily.  . Calcium Citrate-Vitamin D (CALCIUM CITRATE + D PO) Take 1 capsule by mouth 2 (two) times daily.    . clobetasol (TEMOVATE) 0.05 % cream Apply 1 application topically 2 (two) times daily.    Marland Kitchen gabapentin (NEURONTIN) 300 MG capsule TAKE ONE CAPSULE BY MOUTH THREE TIMES DAILY  . glipiZIDE (GLUCOTROL XL) 10 MG 24 hr tablet Take 1 tablet (10 mg total) by mouth daily.  Marland Kitchen LOCOID LIPOCREAM 0.1 % CREA   . metFORMIN (GLUCOPHAGE) 1000 MG tablet Take one tablet by mouth one time daily  . Multiple Vitamin (MULTIVITAMIN) capsule Take 1 capsule by mouth daily.    . ONE TOUCH ULTRA TEST test strip TEST TWICE A DAY  . saxagliptin HCl (ONGLYZA) 2.5 MG TABS tablet Take 1 tablet (2.5 mg total) by mouth daily.  . simvastatin (ZOCOR) 40 MG tablet TAKE ONE TABLET  BY MOUTH   NIGHTLY AT BEDTIME  . STELARA 90 MG/ML SOLN Injection as directed  . urea (CARMOL) 40 % CREA as directed.   Marland Kitchen HYDROcodone-acetaminophen (NORCO/VICODIN) 5-325 MG per tablet Take 1 tablet by mouth every 6 (six) hours as needed for pain.    Review of Systems  Constitutional: Negative for fever, chills, appetite change, fatigue and unexpected weight change.  HENT: Negative for congestion, ear pain, sinus pressure, sore throat, trouble swallowing and voice change.   Eyes: Negative for visual disturbance.  Respiratory: Negative for cough, shortness of breath, wheezing and stridor.   Cardiovascular: Negative for chest pain, palpitations and leg swelling.  Gastrointestinal: Negative for nausea, vomiting, abdominal pain, diarrhea, constipation, blood in stool, abdominal distention and anal bleeding.  Genitourinary: Negative for dysuria and flank pain.  Musculoskeletal: Negative for arthralgias, gait problem, myalgias and neck pain.  Skin: Negative for color change and rash.  Neurological: Negative for dizziness and headaches.  Hematological: Negative for adenopathy. Does not bruise/bleed easily.  Psychiatric/Behavioral: Negative for suicidal ideas, sleep disturbance and dysphoric mood. The patient is not nervous/anxious.        Objective:   Physical Exam  Constitutional: She is oriented to person, place, and time. She appears well-developed and well-nourished. No distress.  HENT:  Head: Normocephalic and atraumatic.  Right Ear: External ear normal.  Left Ear: External ear normal.  Nose: Nose normal.  Mouth/Throat: Oropharynx is clear and moist. No  oropharyngeal exudate.  Eyes: Conjunctivae are normal. Pupils are equal, round, and reactive to light. Right eye exhibits no discharge. Left eye exhibits no discharge. No scleral icterus.  Neck: Normal range of motion. Neck supple. No tracheal deviation present. No thyromegaly present.  Cardiovascular: Normal rate, regular rhythm, normal  heart sounds and intact distal pulses.  Exam reveals no gallop and no friction rub.   No murmur heard. Pulmonary/Chest: Effort normal and breath sounds normal. No accessory muscle usage. Not tachypneic. No respiratory distress. She has no decreased breath sounds. She has no wheezes. She has no rhonchi. She has no rales. She exhibits no tenderness.  Musculoskeletal: Normal range of motion. She exhibits no edema and no tenderness.  Lymphadenopathy:    She has no cervical adenopathy.  Neurological: She is alert and oriented to person, place, and time. No cranial nerve deficit. She exhibits normal muscle tone. Coordination normal.  Skin: Skin is warm and dry. Rash noted. Rash is pustular (bilateral palms and soles). She is not diaphoretic. No erythema. No pallor.  Psychiatric: She has a normal mood and affect. Her behavior is normal. Judgment and thought content normal.          Assessment & Plan:

## 2013-11-06 NOTE — Assessment & Plan Note (Signed)
Lab Results  Component Value Date   HGBA1C 7.6* 11/05/2013   BG better controlled over last few months. Continue Metformin, Glipizide, Onglyza. Foot exam normal today, except for stable psoriasis.

## 2013-11-09 ENCOUNTER — Other Ambulatory Visit: Payer: Medicare PPO

## 2013-11-15 ENCOUNTER — Ambulatory Visit: Payer: Self-pay | Admitting: Internal Medicine

## 2013-11-16 ENCOUNTER — Other Ambulatory Visit: Payer: Self-pay | Admitting: Internal Medicine

## 2013-11-25 ENCOUNTER — Other Ambulatory Visit (INDEPENDENT_AMBULATORY_CARE_PROVIDER_SITE_OTHER): Payer: Medicare PPO

## 2013-11-25 ENCOUNTER — Other Ambulatory Visit: Payer: Self-pay

## 2013-11-25 DIAGNOSIS — R5381 Other malaise: Secondary | ICD-10-CM

## 2013-11-25 DIAGNOSIS — R5383 Other fatigue: Principal | ICD-10-CM

## 2013-11-25 DIAGNOSIS — I059 Rheumatic mitral valve disease, unspecified: Secondary | ICD-10-CM

## 2013-12-06 ENCOUNTER — Ambulatory Visit (INDEPENDENT_AMBULATORY_CARE_PROVIDER_SITE_OTHER): Payer: Medicare PPO | Admitting: *Deleted

## 2013-12-06 DIAGNOSIS — D519 Vitamin B12 deficiency anemia, unspecified: Secondary | ICD-10-CM

## 2013-12-06 DIAGNOSIS — D518 Other vitamin B12 deficiency anemias: Secondary | ICD-10-CM

## 2013-12-06 MED ORDER — CYANOCOBALAMIN 1000 MCG/ML IJ SOLN
1000.0000 ug | Freq: Once | INTRAMUSCULAR | Status: AC
  Administered 2013-12-06: 1000 ug via INTRAMUSCULAR

## 2013-12-07 ENCOUNTER — Other Ambulatory Visit: Payer: Self-pay | Admitting: Internal Medicine

## 2013-12-14 ENCOUNTER — Encounter: Payer: Self-pay | Admitting: Internal Medicine

## 2014-01-06 ENCOUNTER — Ambulatory Visit (INDEPENDENT_AMBULATORY_CARE_PROVIDER_SITE_OTHER): Payer: Medicare PPO | Admitting: *Deleted

## 2014-01-06 DIAGNOSIS — E538 Deficiency of other specified B group vitamins: Secondary | ICD-10-CM

## 2014-01-06 MED ORDER — CYANOCOBALAMIN 1000 MCG/ML IJ SOLN
1000.0000 ug | Freq: Once | INTRAMUSCULAR | Status: AC
  Administered 2014-01-06: 1000 ug via INTRAMUSCULAR

## 2014-01-28 LAB — HM DIABETES EYE EXAM

## 2014-02-04 ENCOUNTER — Encounter: Payer: Self-pay | Admitting: Internal Medicine

## 2014-02-04 ENCOUNTER — Ambulatory Visit (INDEPENDENT_AMBULATORY_CARE_PROVIDER_SITE_OTHER): Payer: Medicare PPO | Admitting: Internal Medicine

## 2014-02-04 VITALS — BP 130/78 | HR 90 | Temp 97.7°F | Wt 174.0 lb

## 2014-02-04 DIAGNOSIS — E785 Hyperlipidemia, unspecified: Secondary | ICD-10-CM

## 2014-02-04 DIAGNOSIS — R300 Dysuria: Secondary | ICD-10-CM | POA: Insufficient documentation

## 2014-02-04 DIAGNOSIS — L408 Other psoriasis: Secondary | ICD-10-CM

## 2014-02-04 DIAGNOSIS — E538 Deficiency of other specified B group vitamins: Secondary | ICD-10-CM

## 2014-02-04 DIAGNOSIS — E119 Type 2 diabetes mellitus without complications: Secondary | ICD-10-CM

## 2014-02-04 DIAGNOSIS — R5383 Other fatigue: Secondary | ICD-10-CM

## 2014-02-04 DIAGNOSIS — R5381 Other malaise: Secondary | ICD-10-CM

## 2014-02-04 DIAGNOSIS — L409 Psoriasis, unspecified: Secondary | ICD-10-CM

## 2014-02-04 LAB — COMPREHENSIVE METABOLIC PANEL
ALBUMIN: 4.1 g/dL (ref 3.5–5.2)
ALK PHOS: 73 U/L (ref 39–117)
ALT: 38 U/L — ABNORMAL HIGH (ref 0–35)
AST: 31 U/L (ref 0–37)
BUN: 19 mg/dL (ref 6–23)
CALCIUM: 10.2 mg/dL (ref 8.4–10.5)
CO2: 33 meq/L — AB (ref 19–32)
Chloride: 98 mEq/L (ref 96–112)
Creatinine, Ser: 1 mg/dL (ref 0.4–1.2)
GFR: 56.31 mL/min — AB (ref >60.00)
GLUCOSE: 194 mg/dL — AB (ref 70–99)
POTASSIUM: 4.2 meq/L (ref 3.5–5.1)
SODIUM: 139 meq/L (ref 135–145)
TOTAL PROTEIN: 7.6 g/dL (ref 6.0–8.3)
Total Bilirubin: 0.8 mg/dL (ref 0.3–1.2)

## 2014-02-04 LAB — CBC WITH DIFFERENTIAL/PLATELET
BASOS PCT: 0.3 % (ref 0.0–3.0)
Basophils Absolute: 0.1 10*3/uL (ref 0.0–0.1)
Eosinophils Absolute: 0.2 10*3/uL (ref 0.0–0.7)
Eosinophils Relative: 1.2 % (ref 0.0–5.0)
HCT: 45.3 % (ref 36.0–46.0)
HEMOGLOBIN: 15.2 g/dL — AB (ref 12.0–15.0)
Lymphocytes Relative: 31.8 % (ref 12.0–46.0)
Lymphs Abs: 4.7 10*3/uL — ABNORMAL HIGH (ref 0.7–4.0)
MCHC: 33.5 g/dL (ref 30.0–36.0)
MCV: 101.4 fl — ABNORMAL HIGH (ref 78.0–100.0)
MONOS PCT: 6 % (ref 3.0–12.0)
Monocytes Absolute: 0.9 10*3/uL (ref 0.1–1.0)
NEUTROS ABS: 9 10*3/uL — AB (ref 1.4–7.7)
Neutrophils Relative %: 60.7 % (ref 43.0–77.0)
Platelets: 206 10*3/uL (ref 150.0–400.0)
RBC: 4.47 Mil/uL (ref 3.87–5.11)
RDW: 14.3 % (ref 11.5–14.6)
WBC: 14.8 10*3/uL — AB (ref 4.5–10.5)

## 2014-02-04 LAB — POCT URINALYSIS DIPSTICK
Bilirubin, UA: NEGATIVE
GLUCOSE UA: NEGATIVE
KETONES UA: NEGATIVE
Nitrite, UA: NEGATIVE
Protein, UA: 30
UROBILINOGEN UA: 0.2
pH, UA: 5.5

## 2014-02-04 LAB — LIPID PANEL
CHOLESTEROL: 163 mg/dL (ref 0–200)
HDL: 50.1 mg/dL (ref >39.00)
LDL Cholesterol: 74 mg/dL (ref 0–99)
Total CHOL/HDL Ratio: 3
Triglycerides: 194 mg/dL — ABNORMAL HIGH (ref 0.0–149.0)
VLDL: 38.8 mg/dL (ref 0.0–40.0)

## 2014-02-04 LAB — MICROALBUMIN / CREATININE URINE RATIO
Creatinine,U: 144 mg/dL
MICROALB UR: 17.8 mg/dL — AB (ref 0.0–1.9)
MICROALB/CREAT RATIO: 12.4 mg/g (ref 0.0–30.0)

## 2014-02-04 LAB — HEMOGLOBIN A1C: Hgb A1c MFr Bld: 8.8 % — ABNORMAL HIGH (ref 4.6–6.5)

## 2014-02-04 MED ORDER — CYANOCOBALAMIN 1000 MCG/ML IJ SOLN
1000.0000 ug | Freq: Once | INTRAMUSCULAR | Status: AC
  Administered 2014-02-04: 1000 ug via INTRAMUSCULAR

## 2014-02-04 MED ORDER — GLUCOSE BLOOD VI STRP: ORAL_STRIP | Status: DC

## 2014-02-04 MED ORDER — CIPROFLOXACIN HCL 500 MG PO TABS: 500.0000 mg | ORAL_TABLET | Freq: Two times a day (BID) | ORAL | Status: DC

## 2014-02-04 NOTE — Assessment & Plan Note (Signed)
Will check lipids and LFTs with labs today. 

## 2014-02-04 NOTE — Assessment & Plan Note (Signed)
Will check A1c with labs today. Continue current medications. 

## 2014-02-04 NOTE — Progress Notes (Signed)
Subjective:    Patient ID: Caroline Gray, female    DOB: May 04, 1946, 68 y.o.   MRN: [id number]  HPI [name] with DM, HTN, HL, psoriasis presents for follow up.  DM - BG elevated recently with use of prednisone. Some in high 300s. Compliant with medications.  She notes progressive worsening of symptoms of psoriasis. Her insurance would no longer cover treatments prescribed by her dermatologist, so she has gone back to taking methotrexate with prednisone taper. She is concerned about elevated blood sugars while on prednisone. However, symptoms of psoriasis with peeling of her palms has significantly improved.  Over the last couple of days she also notes some increased abdominal pressure and low back pain. She questions whether she might have a urinary tract infection. She denies any fever, chills, flank pain.  Review of Systems  Constitutional: Negative for fever, chills, appetite change, fatigue and unexpected weight change.  HENT: Negative for congestion, ear pain, sinus pressure, sore throat, trouble swallowing and voice change.   Eyes: Negative for visual disturbance.  Respiratory: Negative for cough, shortness of breath, wheezing and stridor.   Cardiovascular: Negative for chest pain, palpitations and leg swelling.  Gastrointestinal: Positive for abdominal pain. Negative for nausea, vomiting, diarrhea, constipation, blood in stool, abdominal distention and anal bleeding.  Genitourinary: Negative for dysuria and flank pain.  Musculoskeletal: Negative for arthralgias, gait problem, myalgias and neck pain.  Skin: Positive for rash. Negative for color change.  Neurological: Negative for dizziness and headaches.  Hematological: Negative for adenopathy. Does not bruise/bleed easily.  Psychiatric/Behavioral: Negative for suicidal ideas, sleep disturbance and dysphoric mood. The patient is not nervous/anxious.        Objective:    BP 130/78  Pulse 90  Temp(Src) 97.7 F (36.5 C) (Oral)   Wt 174 lb (78.926 kg)  SpO2 93% Physical Exam  Constitutional: She is oriented to person, place, and time. She appears well-developed and well-nourished. No distress.  HENT:  Head: Normocephalic and atraumatic.  Right Ear: External ear normal.  Left Ear: External ear normal.  Nose: Nose normal.  Mouth/Throat: Oropharynx is clear and moist. No oropharyngeal exudate.  Eyes: Conjunctivae are normal. Pupils are equal, round, and reactive to light. Right eye exhibits no discharge. Left eye exhibits no discharge. No scleral icterus.  Neck: Normal range of motion. Neck supple. No tracheal deviation present. No thyromegaly present.  Cardiovascular: Normal rate, regular rhythm, normal heart sounds and intact distal pulses.  Exam reveals no gallop and no friction rub.   No murmur heard. Pulmonary/Chest: Effort normal and breath sounds normal. No respiratory distress. She has no wheezes. She has no rales. She exhibits no tenderness.  Abdominal: Soft. Bowel sounds are normal. She exhibits no distension and no mass. There is tenderness (mild suprapubic). There is no rebound and no guarding.  Musculoskeletal: Normal range of motion. She exhibits no edema and no tenderness.  Lymphadenopathy:    She has no cervical adenopathy.  Neurological: She is alert and oriented to person, place, and time. No cranial nerve deficit. She exhibits normal muscle tone. Coordination normal.  Skin: Skin is warm and dry. Rash noted. Rash is pustular (palms and soles). She is not diaphoretic. No erythema. No pallor.  Psychiatric: She has a normal mood and affect. Her behavior is normal. Judgment and thought content normal.          Assessment & Plan:   Problem List Items Addressed This Visit   B12 deficiency   Relevant Medications  cyanocobalamin ((VITAMIN B-12)) injection 1,000 mcg (Completed)   Diabetes mellitus type 2, controlled - Primary     Will check A1c with labs today. Continue current medications.     Relevant Medications      glucose blood (ONE TOUCH ULTRA TEST) test strip   Other Relevant Orders      Comprehensive metabolic panel      Hemoglobin A1c      Lipid panel      Microalbumin / creatinine urine ratio   Dysuria     Recent pelvic pressure and urinalysis most consistent with UTI. Will send urine for culture and start Cipro. Follow up if symptoms are not improving.    Relevant Medications      ciprofloxacin (CIPRO) tablet   Other Relevant Orders      POCT urinalysis dipstick (Completed)      CULTURE, URINE COMPREHENSIVE   Hyperlipidemia     Will check lipids and LFTs with labs today.    Psoriasis     Recent improvement in symptoms with Methotrexate and Prednisone taper, however prednisone not ideal with DM because of elevated glucose. Will continue to monitor.    Relevant Orders      CBC with Differential    Other Visit Diagnoses   Other malaise and fatigue            Return in about 3 months (around 05/07/2014) for Recheck of Diabetes.

## 2014-02-04 NOTE — Assessment & Plan Note (Signed)
Recent improvement in symptoms with Methotrexate and Prednisone taper, however prednisone not ideal with DM because of elevated glucose. Will continue to monitor.

## 2014-02-04 NOTE — Assessment & Plan Note (Signed)
Recent pelvic pressure and urinalysis most consistent with UTI. Will send urine for culture and start Cipro. Follow up if symptoms are not improving.

## 2014-02-05 ENCOUNTER — Encounter: Payer: Self-pay | Admitting: Internal Medicine

## 2014-02-08 ENCOUNTER — Encounter: Payer: Self-pay | Admitting: Internal Medicine

## 2014-02-08 LAB — CULTURE, URINE COMPREHENSIVE

## 2014-02-10 ENCOUNTER — Other Ambulatory Visit: Payer: Self-pay | Admitting: *Deleted

## 2014-02-10 MED ORDER — SAXAGLIPTIN HCL 2.5 MG PO TABS: ORAL_TABLET | ORAL | Status: DC

## 2014-02-10 NOTE — Telephone Encounter (Signed)
Patient requested 90 day supply for Onglyza. New prescription sent to pharmacy.

## 2014-02-11 ENCOUNTER — Encounter: Payer: Self-pay | Admitting: Internal Medicine

## 2014-02-15 ENCOUNTER — Telehealth: Payer: Self-pay

## 2014-02-15 NOTE — Telephone Encounter (Signed)
Relevant patient education assigned to patient using Emmi. ° °

## 2014-02-23 ENCOUNTER — Encounter: Payer: Self-pay | Admitting: Internal Medicine

## 2014-02-23 ENCOUNTER — Ambulatory Visit (INDEPENDENT_AMBULATORY_CARE_PROVIDER_SITE_OTHER): Payer: Medicare PPO | Admitting: Internal Medicine

## 2014-02-23 VITALS — BP 122/70 | HR 102 | Temp 98.1°F | Wt 177.0 lb

## 2014-02-23 DIAGNOSIS — N390 Urinary tract infection, site not specified: Secondary | ICD-10-CM

## 2014-02-23 DIAGNOSIS — R10A2 Flank pain, left side: Secondary | ICD-10-CM | POA: Insufficient documentation

## 2014-02-23 DIAGNOSIS — R109 Unspecified abdominal pain: Secondary | ICD-10-CM

## 2014-02-23 LAB — POCT URINALYSIS DIPSTICK
Bilirubin, UA: NEGATIVE
Glucose, UA: >=1000
Ketones, UA: NEGATIVE
Leukocytes, UA: NEGATIVE
Nitrite, UA: NEGATIVE
PROTEIN UA: NEGATIVE
SPEC GRAV UA: 1.015
Urobilinogen, UA: 0.2
pH, UA: 7

## 2014-02-23 MED ORDER — CIPROFLOXACIN HCL 500 MG PO TABS: 500.0000 mg | ORAL_TABLET | Freq: Two times a day (BID) | ORAL | Status: DC

## 2014-02-23 NOTE — Progress Notes (Signed)
   Subjective:    Patient ID: Caroline Gray, female    DOB: 21-Jun-1946, 68 y.o.   MRN: [id number]  HPI [name] presents for acute visit.  Concerned about left flank pain over last few days. Sharp at times. Does not radiate. No fever, chills. Some increased urgency of urination. Completed 7 days of Cipro 1 week ago.  Review of Systems  Constitutional: Negative for fever, chills and fatigue.  Gastrointestinal: Negative for nausea, vomiting, abdominal pain, diarrhea, constipation and rectal pain.  Genitourinary: Positive for urgency, frequency and flank pain. Negative for dysuria, hematuria, decreased urine volume, vaginal bleeding, vaginal discharge, difficulty urinating, vaginal pain and pelvic pain.       Objective:    BP 122/70  Pulse 102  Temp(Src) 98.1 F (36.7 C) (Oral)  Wt 177 lb (80.287 kg)  SpO2 94% Physical Exam  Constitutional: She is oriented to person, place, and time. She appears well-developed and well-nourished. No distress.  HENT:  Head: Normocephalic and atraumatic.  Right Ear: External ear normal.  Left Ear: External ear normal.  Nose: Nose normal.  Mouth/Throat: Oropharynx is clear and moist. No oropharyngeal exudate.  Eyes: Conjunctivae are normal. Pupils are equal, round, and reactive to light. Right eye exhibits no discharge. Left eye exhibits no discharge. No scleral icterus.  Neck: Normal range of motion. Neck supple. No tracheal deviation present. No thyromegaly present.  Cardiovascular: Normal rate, regular rhythm, normal heart sounds and intact distal pulses.  Exam reveals no gallop and no friction rub.   No murmur heard. Pulmonary/Chest: Effort normal and breath sounds normal. No accessory muscle usage. Not tachypneic. No respiratory distress. She has no decreased breath sounds. She has no wheezes. She has no rhonchi. She has no rales. She exhibits no tenderness.  Musculoskeletal: Normal range of motion. She exhibits no edema.       Thoracic back: She  exhibits tenderness.       Back:  Lymphadenopathy:    She has no cervical adenopathy.  Neurological: She is alert and oriented to person, place, and time. No cranial nerve deficit. She exhibits normal muscle tone. Coordination normal.  Skin: Skin is warm and dry. No rash noted. She is not diaphoretic. No erythema. No pallor.  Psychiatric: She has a normal mood and affect. Her behavior is normal. Judgment and thought content normal.          Assessment & Plan:   Problem List Items Addressed This Visit   Left flank pain     Symptoms and hematuria concerning for nephrolithiasis. Will get CT abdomen/pelvis without contrast for further evaluation.    Relevant Orders      CT Abdomen Pelvis Wo Contrast   UTI (urinary tract infection) - Primary     Symptoms and hematuria concerning for persistent UTI versus nephrolithiasis. Pt is immunocompromised (diabetes mellitus and on immunosuppressive meds for psoriasis). Will send urine for culture. Will get CT abd/pel without contrast. Will start Cipro. Follow up 1 week and prn.    Relevant Medications      ciprofloxacin (CIPRO) tablet   Other Relevant Orders      POCT urinalysis dipstick (Completed)      Urine culture       Return in about 1 week (around 03/02/2014), or if symptoms worsen or fail to improve.

## 2014-02-23 NOTE — Assessment & Plan Note (Signed)
Symptoms and hematuria concerning for nephrolithiasis. Will get CT abdomen/pelvis without contrast for further evaluation.

## 2014-02-23 NOTE — Assessment & Plan Note (Signed)
Symptoms and hematuria concerning for persistent UTI versus nephrolithiasis. Pt is immunocompromised (diabetes mellitus and on immunosuppressive meds for psoriasis). Will send urine for culture. Will get CT abd/pel without contrast. Will start Cipro. Follow up 1 week and prn.

## 2014-02-24 ENCOUNTER — Telehealth: Payer: Self-pay | Admitting: Internal Medicine

## 2014-02-24 LAB — URINE CULTURE
Colony Count: NO GROWTH
Organism ID, Bacteria: NO GROWTH

## 2014-02-24 NOTE — Telephone Encounter (Signed)
Pt auth was processed before lunch. Auth # [id number] exp 03/26/14

## 2014-02-24 NOTE — Telephone Encounter (Signed)
Left vm.  States she left a [employer] administrator for [employer] earlier regarding [employer], requires [place] for CPT code [phone].  Pt coming today at 1:30 and auth has not been done.  Asking if they should reschedule the pt until auth received.  States if she has not heard from [place] she will call the pt and see if pt wants to sign a waiver or reschedule it herself.

## 2014-02-25 ENCOUNTER — Ambulatory Visit: Payer: Self-pay | Admitting: Internal Medicine

## 2014-02-28 ENCOUNTER — Telehealth: Payer: Self-pay | Admitting: Internal Medicine

## 2014-02-28 NOTE — Telephone Encounter (Signed)
Patient informed and verbalized understanding. Will call Dr. Copes office to schedule an appointment, faxed Dr. Jacqlyn Larsen a copy of the ultrasound results.

## 2014-02-28 NOTE — Telephone Encounter (Signed)
CT abdomen showed bilateral kidney stones. These measured 43mm on the right (largest stone) and 17mm on the left (largest stone). She needs follow up with urology asap.

## 2014-03-03 ENCOUNTER — Ambulatory Visit: Payer: Medicare PPO | Admitting: Internal Medicine

## 2014-03-08 ENCOUNTER — Ambulatory Visit: Payer: Medicare PPO

## 2014-03-12 ENCOUNTER — Encounter: Payer: Self-pay | Admitting: Internal Medicine

## 2014-03-29 ENCOUNTER — Encounter: Payer: Self-pay | Admitting: Internal Medicine

## 2014-03-30 ENCOUNTER — Ambulatory Visit: Payer: Self-pay | Admitting: Urology

## 2014-04-07 ENCOUNTER — Ambulatory Visit: Payer: Self-pay | Admitting: Urology

## 2014-04-21 ENCOUNTER — Ambulatory Visit: Payer: Self-pay | Admitting: Urology

## 2014-04-26 ENCOUNTER — Ambulatory Visit (INDEPENDENT_AMBULATORY_CARE_PROVIDER_SITE_OTHER): Payer: Medicare PPO | Admitting: *Deleted

## 2014-04-26 DIAGNOSIS — E538 Deficiency of other specified B group vitamins: Secondary | ICD-10-CM

## 2014-04-26 MED ORDER — CYANOCOBALAMIN 1000 MCG/ML IJ SOLN
1000.0000 ug | Freq: Once | INTRAMUSCULAR | Status: AC
  Administered 2014-04-26: 1000 ug via INTRAMUSCULAR

## 2014-05-08 ENCOUNTER — Other Ambulatory Visit: Payer: Self-pay | Admitting: Internal Medicine

## 2014-05-09 NOTE — Telephone Encounter (Signed)
Last OV 3.20.15, last refill 12.7.14.  Please advise refill

## 2014-05-10 ENCOUNTER — Ambulatory Visit: Payer: Self-pay | Admitting: Urology

## 2014-05-11 ENCOUNTER — Ambulatory Visit: Payer: Medicare PPO | Admitting: Internal Medicine

## 2014-05-24 ENCOUNTER — Encounter: Payer: Self-pay | Admitting: Internal Medicine

## 2014-05-24 ENCOUNTER — Ambulatory Visit (INDEPENDENT_AMBULATORY_CARE_PROVIDER_SITE_OTHER): Payer: Medicare PPO | Admitting: Internal Medicine

## 2014-05-24 ENCOUNTER — Ambulatory Visit: Payer: Medicare PPO | Admitting: Internal Medicine

## 2014-05-24 VITALS — BP 116/60 | HR 94 | Temp 98.2°F | Ht 63.0 in | Wt 166.2 lb

## 2014-05-24 DIAGNOSIS — L408 Other psoriasis: Secondary | ICD-10-CM

## 2014-05-24 DIAGNOSIS — E119 Type 2 diabetes mellitus without complications: Secondary | ICD-10-CM

## 2014-05-24 DIAGNOSIS — N200 Calculus of kidney: Secondary | ICD-10-CM | POA: Insufficient documentation

## 2014-05-24 DIAGNOSIS — I2584 Coronary atherosclerosis due to calcified coronary lesion: Secondary | ICD-10-CM

## 2014-05-24 DIAGNOSIS — L409 Psoriasis, unspecified: Secondary | ICD-10-CM

## 2014-05-24 DIAGNOSIS — I2510 Atherosclerotic heart disease of native coronary artery without angina pectoris: Secondary | ICD-10-CM | POA: Insufficient documentation

## 2014-05-24 MED ORDER — SIMVASTATIN 40 MG PO TABS: ORAL_TABLET | ORAL | Status: DC

## 2014-05-24 NOTE — Patient Instructions (Signed)
In frequent blood sugars less than 70, then stop Onglyza.  Labs today.  Follow up 3 months and as needed.

## 2014-05-24 NOTE — Progress Notes (Signed)
Pre visit review using our clinic review tool, if applicable. No additional management support is needed unless otherwise documented below in the visit note. 

## 2014-05-24 NOTE — Progress Notes (Signed)
Subjective:    Patient ID: Caroline Gray, female    DOB: [birth year], 68 y.o.   MRN: [id number]  HPI [name] presents for follow up.  Psoriasis - Had some issues with abdominal pain and diarrhea with medication. Poor appetite. Has lost >10lbs. No improvement in peeling skin over palms and soles. Has follow up with dermatology on 7/21.  DM - BG have been well controlled with recent poor po intake and weight loss. Some low BG near 60 in the mid mornings. Compliant with meds.  Renal stone- had lithotripsy in May with Dr. Jacqlyn Larsen. Stone broke but did not completely resolve. Has follow up scheduled.  Review of Systems  Constitutional: Negative for fever, chills, appetite change, fatigue and unexpected weight change.  Eyes: Negative for visual disturbance.  Respiratory: Negative for shortness of breath.   Cardiovascular: Negative for chest pain and leg swelling.  Gastrointestinal: Positive for diarrhea. Negative for nausea, vomiting, abdominal pain and constipation.  Musculoskeletal: Negative for arthralgias and myalgias.  Skin: Positive for rash. Negative for color change.  Hematological: Negative for adenopathy. Does not bruise/bleed easily.  Psychiatric/Behavioral: Negative for sleep disturbance and dysphoric mood. The patient is not nervous/anxious.        Objective:    BP 116/60  Pulse 94  Temp(Src) 98.2 F (36.8 C) (Oral)  Ht 5\' 3"  (1.6 m)  Wt 166 lb 4 oz (75.411 kg)  BMI 29.46 kg/m2  SpO2 93% Physical Exam  Constitutional: She is oriented to person, place, and time. She appears well-developed and well-nourished. No distress.  HENT:  Head: Normocephalic and atraumatic.  Right Ear: External ear normal.  Left Ear: External ear normal.  Nose: Nose normal.  Mouth/Throat: Oropharynx is clear and moist. No oropharyngeal exudate.  Eyes: Conjunctivae are normal. Pupils are equal, round, and reactive to light. Right eye exhibits no discharge. Left eye exhibits no discharge. No  scleral icterus.  Neck: Normal range of motion. Neck supple. No tracheal deviation present. No thyromegaly present.  Cardiovascular: Normal rate, regular rhythm, normal heart sounds and intact distal pulses.  Exam reveals no gallop and no friction rub.   No murmur heard. Pulmonary/Chest: Effort normal and breath sounds normal. No accessory muscle usage. Not tachypneic. No respiratory distress. She has no decreased breath sounds. She has no wheezes. She has no rhonchi. She has no rales. She exhibits no tenderness.  Abdominal: Soft. Bowel sounds are normal. She exhibits no distension and no mass. There is no tenderness. There is no rebound and no guarding.  Musculoskeletal: Normal range of motion. She exhibits no edema and no tenderness.  Lymphadenopathy:    She has no cervical adenopathy.  Neurological: She is alert and oriented to person, place, and time. No cranial nerve deficit. She exhibits normal muscle tone. Coordination normal.  Skin: Skin is warm and dry. Rash (peeling pustular rash over palms and soles) noted. She is not diaphoretic. There is erythema. No pallor.  Psychiatric: She has a normal mood and affect. Her behavior is normal. Judgment and thought content normal.          Assessment & Plan:   Problem List Items Addressed This Visit     Unprioritized   Coronary artery calcification     Coronary artery calcifications seen on CT chest. No current symptoms of CAD but high risk with family history, hyperlipidemia, diabetes. Will set up cardiology evaluation for possible stress test.    Relevant Medications      simvastatin (ZOCOR) tablet  Other Relevant Orders      Ambulatory referral to Cardiology   Diabetes mellitus type 2, controlled - Primary     Blood sugars improved by report. Will check A1c with labs today. May need to hold Onglyza if persistent BG<70. For now, will continue Metformin, Glipizide and Onglyza. Foot exam shows normal sensation, however psoriasis noted.     Relevant Medications      simvastatin (ZOCOR) tablet   Other Relevant Orders      Comprehensive metabolic panel      Hemoglobin A1c   Nephrolith     Recent renal stone s/p lithotripsy with Dr. Jacqlyn Larsen. Currently asymptomatic. Will follow.    Psoriasis     Severe psoriasis. No improvement with Methotrexate and recent [place]. On chronic prednisone as well with no improvement. Follow up with Dermatology scheduled for 7.21.        Return in about 3 months (around 08/24/2014) for Wellness Visit.

## 2014-05-24 NOTE — Assessment & Plan Note (Signed)
Coronary artery calcifications seen on CT chest. No current symptoms of CAD but high risk with family history, hyperlipidemia, diabetes. Will set up cardiology evaluation for possible stress test.

## 2014-05-24 NOTE — Assessment & Plan Note (Signed)
Recent renal stone s/p lithotripsy with Dr. Jacqlyn Larsen. Currently asymptomatic. Will follow.

## 2014-05-24 NOTE — Assessment & Plan Note (Signed)
Blood sugars improved by report. Will check A1c with labs today. May need to hold Onglyza if persistent BG<70. For now, will continue Metformin, Glipizide and Onglyza. Foot exam shows normal sensation, however psoriasis noted.

## 2014-05-24 NOTE — Assessment & Plan Note (Signed)
Severe psoriasis. No improvement with Methotrexate and recent [place]. On chronic prednisone as well with no improvement. Follow up with Dermatology scheduled for 7.21.

## 2014-05-25 LAB — COMPREHENSIVE METABOLIC PANEL
ALBUMIN: 3.8 g/dL (ref 3.5–5.2)
ALK PHOS: 54 U/L (ref 39–117)
ALT: 25 U/L (ref 0–35)
AST: 36 U/L (ref 0–37)
BUN: 10 mg/dL (ref 6–23)
CO2: 26 mEq/L (ref 19–32)
Calcium: 9.5 mg/dL (ref 8.4–10.5)
Chloride: 103 mEq/L (ref 96–112)
Creatinine, Ser: 0.9 mg/dL (ref 0.4–1.2)
GFR: 64.91 mL/min (ref >60.00)
Glucose, Bld: 175 mg/dL — ABNORMAL HIGH (ref 70–99)
POTASSIUM: 3.9 meq/L (ref 3.5–5.1)
Sodium: 139 mEq/L (ref 135–145)
TOTAL PROTEIN: 7.2 g/dL (ref 6.0–8.3)
Total Bilirubin: 0.7 mg/dL (ref 0.2–1.2)

## 2014-05-25 LAB — HEMOGLOBIN A1C: Hgb A1c MFr Bld: 7.3 % — ABNORMAL HIGH (ref 4.6–6.5)

## 2014-05-30 ENCOUNTER — Ambulatory Visit (INDEPENDENT_AMBULATORY_CARE_PROVIDER_SITE_OTHER): Payer: Medicare PPO | Admitting: *Deleted

## 2014-05-30 DIAGNOSIS — E538 Deficiency of other specified B group vitamins: Secondary | ICD-10-CM

## 2014-05-30 MED ORDER — CYANOCOBALAMIN 1000 MCG/ML IJ SOLN
1000.0000 ug | Freq: Once | INTRAMUSCULAR | Status: AC
  Administered 2014-05-30: 1000 ug via INTRAMUSCULAR

## 2014-05-31 ENCOUNTER — Other Ambulatory Visit: Payer: Self-pay | Admitting: *Deleted

## 2014-06-14 ENCOUNTER — Encounter: Payer: Self-pay | Admitting: Internal Medicine

## 2014-06-17 ENCOUNTER — Ambulatory Visit (INDEPENDENT_AMBULATORY_CARE_PROVIDER_SITE_OTHER): Payer: Medicare PPO | Admitting: Cardiovascular Disease

## 2014-06-17 ENCOUNTER — Encounter: Payer: Self-pay | Admitting: Cardiovascular Disease

## 2014-06-17 VITALS — BP 128/82 | HR 89 | Ht 63.0 in | Wt 169.5 lb

## 2014-06-17 DIAGNOSIS — E785 Hyperlipidemia, unspecified: Secondary | ICD-10-CM

## 2014-06-17 DIAGNOSIS — R0789 Other chest pain: Secondary | ICD-10-CM

## 2014-06-17 DIAGNOSIS — R079 Chest pain, unspecified: Secondary | ICD-10-CM

## 2014-06-17 DIAGNOSIS — I2510 Atherosclerotic heart disease of native coronary artery without angina pectoris: Secondary | ICD-10-CM

## 2014-06-17 DIAGNOSIS — I2584 Coronary atherosclerosis due to calcified coronary lesion: Secondary | ICD-10-CM

## 2014-06-17 MED ORDER — ASPIRIN 81 MG PO TABS: 81.0000 mg | ORAL_TABLET | Freq: Every day | ORAL | Status: AC

## 2014-06-17 NOTE — Patient Instructions (Addendum)
[facility]  Your caregiver has ordered a Stress Test with nuclear imaging. The purpose of this test is to evaluate the blood supply to your heart muscle. This procedure is referred to as a "Non-Invasive Stress Test." This is because other than having an IV started in your vein, nothing is inserted or "invades" your body. Cardiac stress tests are done to find areas of poor blood flow to the heart by determining the extent of coronary artery disease (CAD). Some patients exercise on a treadmill, which naturally increases the blood flow to your heart, while others who are  unable to walk on a treadmill due to physical limitations have a pharmacologic/chemical stress agent called Lexiscan . This medicine will mimic walking on a treadmill by temporarily increasing your coronary blood flow.   Please note: these test may take anywhere between 2-4 hours to complete  PLEASE REPORT TO [facility] AT THE FIRST DESK WILL DIRECT YOU WHERE TO GO  Date of Procedure:________8/4/15_____________________________  Arrival Time for Procedure:______0745am________________________  Instructions regarding medication:   _x___ : Hold diabetes medication morning of procedure    PLEASE NOTIFY THE OFFICE AT LEAST 24 HOURS IN ADVANCE IF YOU ARE UNABLE TO KEEP YOUR APPOINTMENT.  [phone] AND  PLEASE NOTIFY NUCLEAR MEDICINE AT [address] [facility] AT LEAST 24 HOURS IN ADVANCE IF YOU ARE UNABLE TO KEEP YOUR APPOINTMENT. [phone]  How to prepare for your Myoview test:  1. Do not eat or drink after midnight 2. No caffeine for 24 hours prior to test 3. No smoking 24 hours prior to test. 4. Your medication may be taken with water.  If your doctor stopped a medication because of this test, do not take that medication. 5. Ladies, please do not wear dresses.  Skirts or pants are appropriate. Please wear a short sleeve shirt. 6. No perfume, cologne or lotion. 7. Wear comfortable walking shoes. No  heels!       Your physician has recommended you make the following change in your medication:  Start Aspirin 81 mg once daily   Your physician wants you to follow-up in: 1 year with Dr. Fletcher Anon.  You will receive a reminder letter in the mail two months in advance. If you don't receive a letter, please call our office to schedule the follow-up appointment.

## 2014-06-17 NOTE — Assessment & Plan Note (Signed)
The patient has multiple risk factors for coronary artery disease. She also reports significant exertional dyspnea with occasional substernal chest tightness. Thus, I recommend evaluation with a treadmill nuclear stress test. I started aspirin 81 mg once daily. I discussed with the patient the importance of lifestyle changes in order to decrease the chance of future coronary artery disease and cardiovascular events. We discussed the importance of controlling risk factors, healthy diet as well as regular exercise. I also explained to him that a normal stress test does not rule out atherosclerosis.  If stress test is normal, I recommend continued treatment of risk factors and followup with me in one year. Coronary calcium score can be considered to evaluate his response to treatment.

## 2014-06-17 NOTE — Assessment & Plan Note (Signed)
Lab Results  Component Value Date   CHOL 163 02/04/2014   HDL 50.10 02/04/2014   LDLCALC 74 02/04/2014   LDLDIRECT 74.7 11/05/2013   TRIG 194.0* 02/04/2014   CHOLHDL 3 02/04/2014   Continue treatment with Simvastatin with a target LDL <100.

## 2014-06-17 NOTE — Progress Notes (Signed)
Primary care physician: Dr. Ronette Deter  HPI  This is a pleasant 68 year old female who was referred for evaluation of coronary artery disease. She has chronic medical conditions that include type 2 diabetes which was diagnosed in the early 90s, hypertension, hyperlipidemia and severe psoriasis. She also has family history of coronary artery disease. One of her brothers had massive myocardial infarction at the age of 24. Another brother had 5 cardiac stents. She recently had a routine CT scan done for kidney stones. She was noted to have significant coronary calcifications. She reports progressive symptoms of exertional dyspnea over the last year with occasional substernal chest tightness which usually last for a few minutes. No orthopnea, PND or lower extremity edema.  Allergies  Allergen Reactions  . Codeine     Nausea and vomiting  . Influenza Vaccines     Kandis Mannan  . Ivp Dye [Iodinated Diagnostic Agents]     Shaking and rapid heart beat  . Macrodantin     Vomiting and nausea     Current Outpatient Prescriptions on File Prior to Visit  Medication Sig Dispense Refill  . Apremilast (OTEZLA) 30 MG TABS Take 30 mg by mouth 2 (two) times daily.      . Calcium Citrate-Vitamin D (CALCIUM CITRATE + D PO) Take 1 capsule by mouth 2 (two) times daily.        . clobetasol (TEMOVATE) 0.05 % cream Apply 1 application topically 2 (two) times daily.        Marland Kitchen gabapentin (NEURONTIN) 300 MG capsule Take one capsule by mouth three times daily  90 capsule  9  . glipiZIDE (GLUCOTROL XL) 10 MG 24 hr tablet Take 1 tablet (10 mg total) by mouth daily.  90 tablet  3  . glucose blood test strip 1 each by Other route as needed for other. Test 2 times daily with Freestyle Lite monitor      . glucose monitoring kit (FREESTYLE) monitoring kit 1 each by Does not apply route as needed for other. Freestyle Lite      . leucovorin (WELLCOVORIN) 5 MG tablet Take 5 mg by mouth once a week.      Marland Kitchen LOCOID  LIPOCREAM 0.1 % CREA       . metFORMIN (GLUCOPHAGE) 1000 MG tablet TAKE ONE TABLET BY MOUTH ONE TIME DAILY   90 tablet  0  . methotrexate (RHEUMATREX) 2.5 MG tablet Take 3 tablets in the AM and 2 tablet in the PM once weekly for psoriasis      . Multiple Vitamin (MULTIVITAMIN) capsule Take 1 capsule by mouth daily.        . predniSONE (DELTASONE) 10 MG tablet 10 mg every other day      . saxagliptin HCl (ONGLYZA) 2.5 MG TABS tablet TAKE ONE TABLET BY MOUTH ONE TIME DAILY  90 tablet  2  . simvastatin (ZOCOR) 40 MG tablet TAKE ONE TABLET BY MOUTH   NIGHTLY AT BEDTIME  90 tablet  3  . urea (CARMOL) 40 % CREA as directed.        No current facility-administered medications on file prior to visit.     Past Medical History  Diagnosis Date  . Diabetes mellitus   . Hyperlipidemia   . Psoriasis   . Guillain-Barre   . Hypertension   . Positive PPD   . Adenomatous colon polyp   . Diverticulosis   . Arthritis   . IBS (irritable bowel syndrome)   . History of kidney stones   .  Hx: UTI (urinary tract infection)   . Cataract      Past Surgical History  Procedure Laterality Date  . Lithotripsy  3.7.13    Dr Jacqlyn Larsen  . Rotator cuff repair  08/2005  . Spine surgery    . Abdominal hysterectomy    . Cholecystectomy    . Appendectomy    . Cataract extraction       Family History  Problem Relation Age of Onset  . Diabetes Mother   . Colon cancer Neg Hx   . Colon polyps Neg Hx   . Rectal cancer Neg Hx   . Stomach cancer Neg Hx   . Other Son     Ulcerative Proctitis   . Heart attack Paternal Aunt   . Heart attack Paternal Uncle   . Heart attack Paternal Uncle   . Heart attack Paternal Uncle   . Heart attack Paternal Uncle   . Heart attack Paternal Aunt   . Heart attack Paternal Aunt   . Heart attack Paternal Aunt      History   Social History  . Marital Status: Married    Spouse Name: N/A    Number of Children: N/A  . Years of Education: N/A   Occupational History  . Not  on file.   Social History Main Topics  . Smoking status: Former Smoker -- 15 years    Types: Cigarettes    Quit date: 11/19/1999  . Smokeless tobacco: Never Used  . Alcohol Use: Yes     Comment: occassionaly  . Drug Use: No  . Sexual Activity: Not on file   Other Topics Concern  . Not on file   Social History Narrative  . No narrative on file     ROS A 10 point review of system was performed. It is negative other than that mentioned in the history of present illness.   PHYSICAL EXAM   BP 128/82  Pulse 89  Ht 5' 3"  (1.6 m)  Wt 169 lb 8 oz (76.885 kg)  BMI 30.03 kg/m2 Constitutional: She is oriented to person, place, and time. She appears well-developed and well-nourished. No distress.  HENT: No nasal discharge.  Head: Normocephalic and atraumatic.  Eyes: Pupils are equal and round. No discharge.  Neck: Normal range of motion. Neck supple. No JVD present. No thyromegaly present.  Cardiovascular: Normal rate, regular rhythm, normal heart sounds. Exam reveals no gallop and no friction rub. No murmur heard.  Pulmonary/Chest: Effort normal and breath sounds normal. No stridor. No respiratory distress. She has no wheezes. She has no rales. She exhibits no tenderness.  Abdominal: Soft. Bowel sounds are normal. She exhibits no distension. There is no tenderness. There is no rebound and no guarding.  Musculoskeletal: Normal range of motion. She exhibits no edema and no tenderness.  Neurological: She is alert and oriented to person, place, and time. Coordination normal.  Skin: Skin is warm and dry. No rash noted. She is not diaphoretic. No erythema. No pallor.  Psychiatric: She has a normal mood and affect. Her behavior is normal. Judgment and thought content normal.     KBT:CYELY  Rhythm  WITHIN NORMAL LIMITS   ASSESSMENT AND PLAN

## 2014-06-18 MED ORDER — SAXAGLIPTIN HCL 2.5 MG PO TABS: ORAL_TABLET | ORAL | Status: DC

## 2014-06-21 ENCOUNTER — Ambulatory Visit: Payer: Self-pay | Admitting: Cardiovascular Disease

## 2014-06-21 ENCOUNTER — Other Ambulatory Visit: Payer: Self-pay

## 2014-06-21 DIAGNOSIS — R079 Chest pain, unspecified: Secondary | ICD-10-CM

## 2014-06-30 ENCOUNTER — Ambulatory Visit (INDEPENDENT_AMBULATORY_CARE_PROVIDER_SITE_OTHER): Payer: Medicare PPO | Admitting: *Deleted

## 2014-06-30 DIAGNOSIS — E538 Deficiency of other specified B group vitamins: Secondary | ICD-10-CM

## 2014-06-30 MED ORDER — CYANOCOBALAMIN 1000 MCG/ML IJ SOLN
1000.0000 ug | Freq: Once | INTRAMUSCULAR | Status: AC
  Administered 2014-06-30: 1000 ug via INTRAMUSCULAR

## 2014-08-02 ENCOUNTER — Ambulatory Visit (INDEPENDENT_AMBULATORY_CARE_PROVIDER_SITE_OTHER): Payer: Medicare PPO | Admitting: *Deleted

## 2014-08-02 DIAGNOSIS — E538 Deficiency of other specified B group vitamins: Secondary | ICD-10-CM

## 2014-08-02 MED ORDER — CYANOCOBALAMIN 1000 MCG/ML IJ SOLN
1000.0000 ug | Freq: Once | INTRAMUSCULAR | Status: AC
  Administered 2014-08-02: 1000 ug via INTRAMUSCULAR

## 2014-08-14 ENCOUNTER — Other Ambulatory Visit: Payer: Self-pay | Admitting: Internal Medicine

## 2014-08-24 ENCOUNTER — Ambulatory Visit: Payer: Medicare PPO | Admitting: Internal Medicine

## 2014-08-30 ENCOUNTER — Encounter: Payer: Self-pay | Admitting: Internal Medicine

## 2014-08-30 ENCOUNTER — Ambulatory Visit (INDEPENDENT_AMBULATORY_CARE_PROVIDER_SITE_OTHER): Payer: Medicare PPO | Admitting: Internal Medicine

## 2014-08-30 VITALS — BP 142/78 | HR 80 | Temp 98.3°F | Ht 63.5 in | Wt 171.5 lb

## 2014-08-30 DIAGNOSIS — E119 Type 2 diabetes mellitus without complications: Secondary | ICD-10-CM

## 2014-08-30 DIAGNOSIS — Z0000 Encounter for general adult medical examination without abnormal findings: Secondary | ICD-10-CM | POA: Insufficient documentation

## 2014-08-30 DIAGNOSIS — E538 Deficiency of other specified B group vitamins: Secondary | ICD-10-CM

## 2014-08-30 LAB — COMPREHENSIVE METABOLIC PANEL
ALT: 40 U/L — ABNORMAL HIGH (ref 0–35)
AST: 63 U/L — ABNORMAL HIGH (ref 0–37)
Albumin: 3.5 g/dL (ref 3.5–5.2)
Alkaline Phosphatase: 83 U/L (ref 39–117)
BUN: 12 mg/dL (ref 6–23)
CO2: 27 meq/L (ref 19–32)
Calcium: 9.9 mg/dL (ref 8.4–10.5)
Chloride: 100 mEq/L (ref 96–112)
Creatinine, Ser: 0.8 mg/dL (ref 0.4–1.2)
GFR: 77.49 mL/min (ref >60.00)
GLUCOSE: 181 mg/dL — AB (ref 70–99)
Potassium: 4.4 mEq/L (ref 3.5–5.1)
SODIUM: 138 meq/L (ref 135–145)
TOTAL PROTEIN: 7.7 g/dL (ref 6.0–8.3)
Total Bilirubin: 0.6 mg/dL (ref 0.2–1.2)

## 2014-08-30 LAB — HM DIABETES FOOT EXAM: HM Diabetic Foot Exam: NORMAL

## 2014-08-30 LAB — HEMOGLOBIN A1C: HEMOGLOBIN A1C: 9.2 % — AB (ref 4.6–6.5)

## 2014-08-30 LAB — MICROALBUMIN / CREATININE URINE RATIO
CREATININE, U: 67.6 mg/dL
MICROALB/CREAT RATIO: 6.8 mg/g (ref 0.0–30.0)
Microalb, Ur: 4.6 mg/dL — ABNORMAL HIGH (ref 0.0–1.9)

## 2014-08-30 LAB — LIPID PANEL
Cholesterol: 157 mg/dL (ref 0–200)
HDL: 32.7 mg/dL — AB (ref >39.00)
LDL Cholesterol: 91 mg/dL (ref 0–99)
NONHDL: 124.3
Total CHOL/HDL Ratio: 5
Triglycerides: 165 mg/dL — ABNORMAL HIGH (ref 0.0–149.0)
VLDL: 33 mg/dL (ref 0.0–40.0)

## 2014-08-30 LAB — HM PAP SMEAR

## 2014-08-30 MED ORDER — CYANOCOBALAMIN 1000 MCG/ML IJ SOLN
1000.0000 ug | Freq: Once | INTRAMUSCULAR | Status: AC
Start: 2014-08-30 — End: 2014-08-30
  Administered 2014-08-30: 1000 ug via INTRAMUSCULAR

## 2014-08-30 MED ORDER — GLUCOSE BLOOD VI STRP: ORAL_STRIP | Status: DC

## 2014-08-30 NOTE — Patient Instructions (Signed)

## 2014-08-30 NOTE — Progress Notes (Signed)
Pre visit review using our clinic review tool, if applicable. No additional management support is needed unless otherwise documented below in the visit note. 

## 2014-08-30 NOTE — Progress Notes (Signed)
The patient is here for annual Medicare Wellness Examination and management of other chronic and acute problems.   The risk factors are reflected in the history.  The roster of all physicians providing medical care to patient - is listed in the Snapshot section of the chart.  Activities of daily living:   The patient is 100% independent in all ADLs: dressing, toileting, feeding as well as independent mobility. Patient lives with husband in a 2-story home with hardwood and stone floors.  Home safety :  The patient has smoke detectors in the home.  They wear seatbelts in their car. There are no firearms at home.  There is no violence in the home. They feel safe where they live.  Infectious Risks: There is no risks for hepatitis, STDs or HIV.  There is no  history of blood transfusion.  They have no travel history to infectious disease endemic areas of the world.  Additional Health Care Providers: The patient has seen their dentist in the last six months. Dentist - Dr. Roselyn Reef at [facility] They have seen their eye doctor in the last year. Opthalmologist - Dr. Sharyne Peach They deny hearing issues. They have deferred audiologic testing in the last year.   They do not  have excessive sun exposure. Discussed the need for sun protection: hats,long sleeves and use of sunscreen if there is significant sun exposure.  Dermatologist - Dr. Marolyn Hammock Urology - Dr. Jacqlyn Larsen  Diet: the importance of a healthy diet is discussed. They do have a healthy diet.  The benefits of regular aerobic exercise were discussed. Patient exercises three times per week, also doing yoga.  Depression screen: there are no signs or vegative symptoms of depression- irritability, change in appetite, anhedonia, sadness/tearfullness.  Cognitive assessment: the patient manages all their financial and personal affairs and is actively engaged. They could relate day,date,year and events.  HCPOA - son, Dajai, [place]. Living Will - yes,  in place  The following portions of the patient's history were reviewed and updated as appropriate: allergies, current medications, past family history, past medical history,  past surgical history, past social history and problem list.  Visual acuity was not assessed per patient preference as they have regular follow up with their ophthalmologist. Hearing and body mass index were assessed and reviewed.   During the course of the visit the patient was educated and counseled about appropriate screening and preventive services including : fall prevention , diabetes screening, nutrition counseling, colorectal cancer screening, and recommended immunizations.    Review of Systems  Constitutional: Negative for fever, chills, appetite change, fatigue and unexpected weight change.  Eyes: Negative for visual disturbance.  Respiratory: Negative for shortness of breath.   Cardiovascular: Negative for chest pain and leg swelling.  Gastrointestinal: Negative for nausea, vomiting, abdominal pain, diarrhea and constipation.  Skin: Positive for rash. Negative for color change.  Hematological: Negative for adenopathy. Does not bruise/bleed easily.  Psychiatric/Behavioral: Positive for sleep disturbance (occasional early waking). Negative for suicidal ideas and dysphoric mood. The patient is not nervous/anxious.        Objective:    BP 142/78  Pulse 80  Temp(Src) 98.3 F (36.8 C) (Oral)  Ht 5' 3.5" (1.613 m)  Wt 171 lb 8 oz (77.792 kg)  BMI 29.90 kg/m2  SpO2 94% Physical Exam  Constitutional: She is oriented to person, place, and time. She appears well-developed and well-nourished. No distress.  HENT:  Head: Normocephalic and atraumatic.  Right Ear: External ear normal.  Left Ear:  External ear normal.  Nose: Nose normal.  Mouth/Throat: Oropharynx is clear and moist. No oropharyngeal exudate.  Eyes: Conjunctivae are normal. Pupils are equal, round, and reactive to light. Right eye exhibits no  discharge. Left eye exhibits no discharge. No scleral icterus.  Neck: Normal range of motion. Neck supple. No tracheal deviation present. No thyromegaly present.  Cardiovascular: Normal rate, regular rhythm, normal heart sounds and intact distal pulses.  Exam reveals no gallop and no friction rub.   No murmur heard. Pulmonary/Chest: Effort normal and breath sounds normal. No accessory muscle usage. Not tachypneic. No respiratory distress. She has no decreased breath sounds. She has no wheezes. She has no rales. She exhibits no tenderness. Right breast exhibits no inverted nipple, no mass, no nipple discharge, no skin change and no tenderness. Left breast exhibits no inverted nipple, no mass, no nipple discharge, no skin change and no tenderness. Breasts are symmetrical.  Abdominal: Soft. Bowel sounds are normal. She exhibits no distension and no mass. There is no tenderness. There is no rebound and no guarding.  Musculoskeletal: Normal range of motion. She exhibits no edema and no tenderness.  Lymphadenopathy:    She has no cervical adenopathy.  Neurological: She is alert and oriented to person, place, and time. No cranial nerve deficit. She exhibits normal muscle tone. Coordination normal.  Skin: Skin is warm and dry. Rash (erythematous pustular rash over bilateral palms) noted. Rash is pustular. She is not diaphoretic. No erythema. No pallor.  Psychiatric: She has a normal mood and affect. Her behavior is normal. Judgment and thought content normal.          Assessment & Plan:   Problem List Items Addressed This Visit     Unprioritized   B12 deficiency   Relevant Medications      cyanocobalamin ((VITAMIN B-12)) injection 1,000 mcg (Completed)   Diabetes mellitus type 2, controlled   Relevant Orders      Comprehensive metabolic panel      Hemoglobin A1c      Lipid panel      Microalbumin / creatinine urine ratio   Medicare annual wellness visit, subsequent - Primary     General  medical exam including breast exam normal today. Mammogram ordered. Colonoscopy UTD. Encouraged healthy diet and exercise. Flu and pneumonia vaccine declined because of history of Guillan Barre. Labs today including CMP, lipids, A1c.    Relevant Orders      MM Digital Screening      POCT Glucose (Device for Home Use)       Return in about 3 months (around 11/30/2014) for Recheck of Diabetes.

## 2014-08-30 NOTE — Assessment & Plan Note (Signed)
General medical exam including breast exam normal today. Mammogram ordered. Colonoscopy UTD. Encouraged healthy diet and exercise. Flu and pneumonia vaccine declined because of history of Guillan Barre. Labs today including CMP, lipids, A1c.

## 2014-09-01 ENCOUNTER — Ambulatory Visit: Payer: Medicare PPO

## 2014-10-05 ENCOUNTER — Ambulatory Visit (INDEPENDENT_AMBULATORY_CARE_PROVIDER_SITE_OTHER): Payer: Medicare PPO | Admitting: *Deleted

## 2014-10-05 DIAGNOSIS — E538 Deficiency of other specified B group vitamins: Secondary | ICD-10-CM

## 2014-10-05 MED ORDER — CYANOCOBALAMIN 1000 MCG/ML IJ SOLN
1000.0000 ug | Freq: Once | INTRAMUSCULAR | Status: AC
  Administered 2014-10-05: 1000 ug via INTRAMUSCULAR

## 2014-10-24 ENCOUNTER — Ambulatory Visit (INDEPENDENT_AMBULATORY_CARE_PROVIDER_SITE_OTHER): Payer: Medicare PPO | Admitting: Internal Medicine

## 2014-10-24 ENCOUNTER — Encounter: Payer: Self-pay | Admitting: Internal Medicine

## 2014-10-24 VITALS — BP 138/78 | HR 102 | Temp 98.0°F | Wt 166.0 lb

## 2014-10-24 DIAGNOSIS — J069 Acute upper respiratory infection, unspecified: Secondary | ICD-10-CM

## 2014-10-24 MED ORDER — HYDROCODONE-HOMATROPINE 5-1.5 MG/5ML PO SYRP: 5.0000 mL | ORAL_SOLUTION | Freq: Three times a day (TID) | ORAL | Status: DC | PRN

## 2014-10-24 MED ORDER — AZITHROMYCIN 250 MG PO TABS: ORAL_TABLET | ORAL | Status: DC

## 2014-10-24 NOTE — Progress Notes (Signed)
Pre visit review using our clinic review tool, if applicable. No additional management support is needed unless otherwise documented below in the visit note. 

## 2014-10-24 NOTE — Progress Notes (Signed)
HPI  Pt presents to the clinic today with cough and nasal congestion. She reports this started 1-2 weeks ago. The cough is productive of yellow/green mucous. She is blowing clear mucous from her nose. She has had associated headache. She has tried Ibuprofen OTC without relief. She has no history of allergies or breathing problems. She has not sick contacts.  Review of Systems    Past Medical History  Diagnosis Date  . Diabetes mellitus   . Hyperlipidemia   . Psoriasis   . Guillain-Barre   . Hypertension   . Positive PPD   . Adenomatous colon polyp   . Diverticulosis   . Arthritis   . IBS (irritable bowel syndrome)   . History of kidney stones   . Hx: UTI (urinary tract infection)   . Cataract     Family History  Problem Relation Age of Onset  . Diabetes Mother   . Colon cancer Neg Hx   . Colon polyps Neg Hx   . Rectal cancer Neg Hx   . Stomach cancer Neg Hx   . Other Son     Ulcerative Proctitis   . Heart attack Paternal Aunt   . Heart attack Paternal Uncle   . Heart attack Paternal Uncle   . Heart attack Paternal Uncle   . Heart attack Paternal Uncle   . Heart attack Paternal Aunt   . Heart attack Paternal Aunt   . Heart attack Paternal Aunt     History   Social History  . Marital Status: Married    Spouse Name: N/A    Number of Children: N/A  . Years of Education: N/A   Occupational History  . Not on file.   Social History Main Topics  . Smoking status: Former Smoker -- 15 years    Types: Cigarettes    Quit date: 11/19/1999  . Smokeless tobacco: Never Used  . Alcohol Use: Yes     Comment: occassionaly  . Drug Use: No  . Sexual Activity: Not on file   Other Topics Concern  . Not on file   Social History Narrative    Allergies  Allergen Reactions  . Codeine     Nausea and vomiting  . Influenza Vaccines     Kandis Mannan  . Ivp Dye [Iodinated Diagnostic Agents]     Shaking and rapid heart beat  . Macrodantin     Vomiting and nausea      Constitutional: Positive headache, fatigue. Denies fever or abrupt weight changes.  HEENT:  Positive runny nose and sore throat. Denies eye redness, ear pain, ringing in the ears, wax buildup, runny nose or bloody nose. Respiratory: Positive cough. Denies difficulty breathing or shortness of breath.  Cardiovascular: Denies chest pain, chest tightness, palpitations or swelling in the hands or feet.   No other specific complaints in a complete review of systems (except as listed in HPI above).  Objective:   BP 138/78 mmHg  Pulse 102  Temp(Src) 98 F (36.7 C) (Oral)  Wt 166 lb (75.297 kg)  SpO2 98%  General: Appears herstated age, ill appearing in NAD. HEENT: Head: normal shape and size, no sinus tenderness noted;  Ears: Tm's oink but intact, normal light reflex; Nose: mucosa pink and moist, septum midline; Throat/Mouth: Teeth present, mucosa erythematous and moist, no exudate noted, no lesions or ulcerations noted. No adenopathy noted. Cardiovascular: Tachycardic with normal rhythm. S1,S2 noted.  No murmur, rubs or gallops noted.  Pulmonary/Chest: Normal effort and positive vesicular breath sounds.  No respiratory distress. No wheezes, rales or ronchi noted.      Assessment & Plan:  Upper Respiratory Infection  Get some rest and drink plenty of fluid Salt water gargles/Ibuprofen for the sore throat eRx for Azithromax x 5 days RX for Hycodan for cough  RTC as needed or if symptoms persist.

## 2014-10-24 NOTE — Patient Instructions (Signed)
Upper Respiratory Infection, Adult An upper respiratory infection (URI) is also sometimes known as the common cold. The upper respiratory tract includes the nose, sinuses, throat, trachea, and bronchi. Bronchi are the airways leading to the lungs. Most people improve within 1 week, but symptoms can last up to 2 weeks. A residual cough may last even longer.  CAUSES Many different viruses can infect the tissues lining the upper respiratory tract. The tissues become irritated and inflamed and often become very moist. Mucus production is also common. A cold is contagious. You can easily spread the virus to others by oral contact. This includes kissing, sharing a glass, coughing, or sneezing. Touching your mouth or nose and then touching a surface, which is then touched by another person, can also spread the virus. SYMPTOMS  Symptoms typically develop 1 to 3 days after you come in contact with a cold virus. Symptoms vary from person to person. They may include:  Runny nose.  Sneezing.  Nasal congestion.  Sinus irritation.  Sore throat.  Loss of voice (laryngitis).  Cough.  Fatigue.  Muscle aches.  Loss of appetite.  Headache.  Low-grade fever. DIAGNOSIS  You might diagnose your own cold based on familiar symptoms, since most people get a cold 2 to 3 times a year. Your caregiver can confirm this based on your exam. Most importantly, your caregiver can check that your symptoms are not due to another disease such as strep throat, sinusitis, pneumonia, asthma, or epiglottitis. Blood tests, throat tests, and X-rays are not necessary to diagnose a common cold, but they may sometimes be helpful in excluding other more serious diseases. Your caregiver will decide if any further tests are required. RISKS AND COMPLICATIONS  You may be at risk for a more severe case of the common cold if you smoke cigarettes, have chronic heart disease (such as heart failure) or lung disease (such as asthma), or if  you have a weakened immune system. The very young and very old are also at risk for more serious infections. Bacterial sinusitis, middle ear infections, and bacterial pneumonia can complicate the common cold. The common cold can worsen asthma and chronic obstructive pulmonary disease (COPD). Sometimes, these complications can require emergency medical care and may be life-threatening. PREVENTION  The best way to protect against getting a cold is to practice good hygiene. Avoid oral or hand contact with people with cold symptoms. Wash your hands often if contact occurs. There is no clear evidence that vitamin C, vitamin E, echinacea, or exercise reduces the chance of developing a cold. However, it is always recommended to get plenty of rest and practice good nutrition. TREATMENT  Treatment is directed at relieving symptoms. There is no cure. Antibiotics are not effective, because the infection is caused by a virus, not by bacteria. Treatment may include:  Increased fluid intake. Sports drinks offer valuable electrolytes, sugars, and fluids.  Breathing heated mist or steam (vaporizer or shower).  Eating chicken soup or other clear broths, and maintaining good nutrition.  Getting plenty of rest.  Using gargles or lozenges for comfort.  Controlling fevers with ibuprofen or acetaminophen as directed by your caregiver.  Increasing usage of your inhaler if you have asthma. Zinc gel and zinc lozenges, taken in the first 24 hours of the common cold, can shorten the duration and lessen the severity of symptoms. Pain medicines may help with fever, muscle aches, and throat pain. A variety of non-prescription medicines are available to treat congestion and runny nose. Your caregiver   can make recommendations and may suggest nasal or lung inhalers for other symptoms.  HOME CARE INSTRUCTIONS   Only take over-the-counter or prescription medicines for pain, discomfort, or fever as directed by your  caregiver.  Use a warm mist humidifier or inhale steam from a shower to increase air moisture. This may keep secretions moist and make it easier to breathe.  Drink enough water and fluids to keep your urine clear or pale yellow.  Rest as needed.  Return to work when your temperature has returned to normal or as your caregiver advises. You may need to stay home longer to avoid infecting others. You can also use a face mask and careful hand washing to prevent spread of the virus. SEEK MEDICAL CARE IF:   After the first few days, you feel you are getting worse rather than better.  You need your caregiver's advice about medicines to control symptoms.  You develop chills, worsening shortness of breath, or brown or red sputum. These may be signs of pneumonia.  You develop yellow or brown nasal discharge or pain in the face, especially when you bend forward. These may be signs of sinusitis.  You develop a fever, swollen neck glands, pain with swallowing, or white areas in the back of your throat. These may be signs of strep throat. SEEK IMMEDIATE MEDICAL CARE IF:   You have a fever.  You develop severe or persistent headache, ear pain, sinus pain, or chest pain.  You develop wheezing, a prolonged cough, cough up blood, or have a change in your usual mucus (if you have chronic lung disease).  You develop sore muscles or a stiff neck. Document Released: 04/30/2001 Document Revised: 01/27/2012 Document Reviewed: 02/09/2014 [employer] Patient Information 2015 [employer]. This information is not intended to replace advice given to you by your health care provider. Make sure you discuss any questions you have with your health care provider.  

## 2014-11-08 ENCOUNTER — Telehealth: Payer: Self-pay | Admitting: Internal Medicine

## 2014-11-08 ENCOUNTER — Ambulatory Visit (INDEPENDENT_AMBULATORY_CARE_PROVIDER_SITE_OTHER): Payer: Medicare PPO

## 2014-11-08 DIAGNOSIS — E538 Deficiency of other specified B group vitamins: Secondary | ICD-10-CM

## 2014-11-08 MED ORDER — CYANOCOBALAMIN 1000 MCG/ML IJ SOLN
1000.0000 ug | Freq: Once | INTRAMUSCULAR | Status: AC
  Administered 2014-11-08: 1000 ug via INTRAMUSCULAR

## 2014-11-08 NOTE — Telephone Encounter (Signed)
Caroline Gray came in for an injection appt and mentioned she was supposed to have a referral sent so she can have a mammogram about a month and a half ago and hasn't heard anything. She said the Mammogram location where she gets them told her they're still waiting to hear from someone here. I told her I'd relay the message. Please call the pt if you have any news regarding this.  Pt ph# [phone] Thank you.

## 2014-11-09 NOTE — Telephone Encounter (Signed)
Gave pt Caroline Gray phone number and advised her that she can call and schedule her own appt.

## 2014-11-30 ENCOUNTER — Ambulatory Visit (INDEPENDENT_AMBULATORY_CARE_PROVIDER_SITE_OTHER): Payer: Medicare PPO | Admitting: Internal Medicine

## 2014-11-30 ENCOUNTER — Encounter: Payer: Self-pay | Admitting: Internal Medicine

## 2014-11-30 VITALS — BP 122/72 | HR 95 | Temp 97.7°F | Ht 63.5 in | Wt 166.2 lb

## 2014-11-30 DIAGNOSIS — L409 Psoriasis, unspecified: Secondary | ICD-10-CM

## 2014-11-30 DIAGNOSIS — E785 Hyperlipidemia, unspecified: Secondary | ICD-10-CM

## 2014-11-30 DIAGNOSIS — E119 Type 2 diabetes mellitus without complications: Secondary | ICD-10-CM

## 2014-11-30 LAB — CBC WITH DIFFERENTIAL/PLATELET
BASOS ABS: 0 10*3/uL (ref 0.0–0.1)
BASOS PCT: 0.5 % (ref 0.0–3.0)
EOS ABS: 0.2 10*3/uL (ref 0.0–0.7)
Eosinophils Relative: 3 % (ref 0.0–5.0)
HCT: 41.5 % (ref 36.0–46.0)
Hemoglobin: 13.7 g/dL (ref 12.0–15.0)
LYMPHS ABS: 2.1 10*3/uL (ref 0.7–4.0)
LYMPHS PCT: 38.3 % (ref 12.0–46.0)
MCHC: 32.9 g/dL (ref 30.0–36.0)
MCV: 102.6 fl — ABNORMAL HIGH (ref 78.0–100.0)
Monocytes Absolute: 0.4 10*3/uL (ref 0.1–1.0)
Monocytes Relative: 6.9 % (ref 3.0–12.0)
NEUTROS ABS: 2.9 10*3/uL (ref 1.4–7.7)
NEUTROS PCT: 51.3 % (ref 43.0–77.0)
Platelets: 188 10*3/uL (ref 150.0–400.0)
RBC: 4.05 Mil/uL (ref 3.87–5.11)
RDW: 13.8 % (ref 11.5–15.5)
WBC: 5.6 10*3/uL (ref 4.0–10.5)

## 2014-11-30 LAB — COMPREHENSIVE METABOLIC PANEL
ALK PHOS: 91 U/L (ref 39–117)
ALT: 32 U/L (ref 0–35)
AST: 50 U/L — ABNORMAL HIGH (ref 0–37)
Albumin: 3.7 g/dL (ref 3.5–5.2)
BILIRUBIN TOTAL: 0.4 mg/dL (ref 0.2–1.2)
BUN: 12 mg/dL (ref 6–23)
CO2: 29 mEq/L (ref 19–32)
CREATININE: 0.76 mg/dL (ref 0.40–1.20)
Calcium: 9.5 mg/dL (ref 8.4–10.5)
Chloride: 102 mEq/L (ref 96–112)
GFR: 79.79 mL/min (ref >60.00)
Glucose, Bld: 267 mg/dL — ABNORMAL HIGH (ref 70–99)
Potassium: 4.2 mEq/L (ref 3.5–5.1)
Sodium: 137 mEq/L (ref 135–145)
Total Protein: 7.4 g/dL (ref 6.0–8.3)

## 2014-11-30 LAB — LIPID PANEL
Cholesterol: 133 mg/dL (ref 0–200)
HDL: 35.2 mg/dL — AB (ref >39.00)
LDL CALC: 62 mg/dL (ref 0–99)
NonHDL: 97.8
Total CHOL/HDL Ratio: 4
Triglycerides: 181 mg/dL — ABNORMAL HIGH (ref 0.0–149.0)
VLDL: 36.2 mg/dL (ref 0.0–40.0)

## 2014-11-30 LAB — MICROALBUMIN / CREATININE URINE RATIO
CREATININE, U: 108.1 mg/dL
MICROALB/CREAT RATIO: 14.7 mg/g (ref 0.0–30.0)
Microalb, Ur: 15.9 mg/dL — ABNORMAL HIGH (ref 0.0–1.9)

## 2014-11-30 LAB — HEMOGLOBIN A1C: Hgb A1c MFr Bld: 7.6 % — ABNORMAL HIGH (ref 4.6–6.5)

## 2014-11-30 NOTE — Patient Instructions (Signed)
Labs today.   Follow up in 3 months.  

## 2014-11-30 NOTE — Progress Notes (Signed)
Subjective:    Patient ID: Caroline Gray, female    DOB: Jun 29, 1946, 69 y.o.   MRN: [id number]  HPI 69YO female presents for follow up.  Treated for URI with azithromycin. Then, week after Christmas, treated for UTI with Cipro at urgent care in [facility]. Seen by Dermatologist one week ago. He felt that UTI might be related to medication. Not having any current symptoms of UTI.  Psoriasis has been flared with increased peeling over hands. Due for Cosentyx, but delayed for insurance reasons.  DM - BG have been variable. Compliant with medications.  Past medical, surgical, family and social history per today's encounter.  Review of Systems  Constitutional: Negative for fever, chills, appetite change, fatigue and unexpected weight change.  Eyes: Negative for visual disturbance.  Respiratory: Negative for shortness of breath.   Cardiovascular: Negative for chest pain and leg swelling.  Gastrointestinal: Negative for nausea, vomiting, abdominal pain, diarrhea and constipation.  Genitourinary: Negative for dysuria, urgency, frequency and pelvic pain.  Skin: Positive for rash. Negative for color change.  Hematological: Negative for adenopathy. Does not bruise/bleed easily.  Psychiatric/Behavioral: Negative for suicidal ideas, sleep disturbance and dysphoric mood. The patient is not nervous/anxious.        Objective:    BP 122/72 mmHg  Pulse 95  Temp(Src) 97.7 F (36.5 C) (Oral)  Ht 5' 3.5" (1.613 m)  Wt 166 lb 4 oz (75.411 kg)  BMI 28.98 kg/m2  SpO2 95% Physical Exam  Constitutional: She is oriented to person, place, and time. She appears well-developed and well-nourished. No distress.  HENT:  Head: Normocephalic and atraumatic.  Right Ear: External ear normal.  Left Ear: External ear normal.  Nose: Nose normal.  Mouth/Throat: Oropharynx is clear and moist. No oropharyngeal exudate.  Eyes: Conjunctivae and EOM are normal. Pupils are equal, round, and reactive to light. Right  eye exhibits no discharge.  Neck: Normal range of motion. Neck supple. No thyromegaly present.  Cardiovascular: Normal rate, regular rhythm, normal heart sounds and intact distal pulses.  Exam reveals no gallop and no friction rub.   No murmur heard. Pulmonary/Chest: Effort normal. No respiratory distress. She has no wheezes. She has no rales.  Abdominal: Soft. Bowel sounds are normal. She exhibits no distension and no mass. There is no tenderness. There is no rebound and no guarding.  Musculoskeletal: Normal range of motion. She exhibits no edema or tenderness.  Lymphadenopathy:    She has no cervical adenopathy.  Neurological: She is alert and oriented to person, place, and time. No cranial nerve deficit. Coordination normal.  Skin: Skin is warm and dry. Rash (peeling over palms and soles) noted. She is not diaphoretic. No erythema. No pallor.  Psychiatric: She has a normal mood and affect. Her behavior is normal. Judgment and thought content normal.          Assessment & Plan:  Over 49min of which >50% spent in face-to-face contact with patient discussing plan of care  Problem List Items Addressed This Visit      Unprioritized   Diabetes mellitus type 2, controlled - Primary    Will check A1c with labs today. Continue Metformin and Onglyza.      Relevant Orders   Comprehensive metabolic panel   Hemoglobin A1c   Lipid panel   Microalbumin / creatinine urine ratio   Psoriasis    Recent worsening of symptoms. Waiting on insurance approval for Consentyx. Will follow.      Relevant Orders   CBC with  Differential       Return in about 3 months (around 03/01/2015) for Recheck of Diabetes.

## 2014-11-30 NOTE — Assessment & Plan Note (Signed)
Will check A1c with labs today. Continue Metformin and Onglyza.

## 2014-11-30 NOTE — Assessment & Plan Note (Signed)
Recent worsening of symptoms. Waiting on insurance approval for Consentyx. Will follow.

## 2014-12-13 ENCOUNTER — Ambulatory Visit: Payer: Medicare PPO

## 2014-12-15 ENCOUNTER — Ambulatory Visit (INDEPENDENT_AMBULATORY_CARE_PROVIDER_SITE_OTHER): Payer: Medicare PPO | Admitting: *Deleted

## 2014-12-15 DIAGNOSIS — E538 Deficiency of other specified B group vitamins: Secondary | ICD-10-CM

## 2014-12-15 MED ORDER — CYANOCOBALAMIN 1000 MCG/ML IJ SOLN
1000.0000 ug | Freq: Once | INTRAMUSCULAR | Status: AC
  Administered 2014-12-15: 1000 ug via INTRAMUSCULAR

## 2014-12-21 ENCOUNTER — Ambulatory Visit: Payer: Self-pay | Admitting: Internal Medicine

## 2014-12-21 LAB — HM MAMMOGRAPHY: HM Mammogram: NEGATIVE

## 2015-01-17 ENCOUNTER — Ambulatory Visit (INDEPENDENT_AMBULATORY_CARE_PROVIDER_SITE_OTHER): Payer: Medicare PPO | Admitting: *Deleted

## 2015-01-17 DIAGNOSIS — E538 Deficiency of other specified B group vitamins: Secondary | ICD-10-CM

## 2015-01-17 MED ORDER — CYANOCOBALAMIN 1000 MCG/ML IJ SOLN
1000.0000 ug | Freq: Once | INTRAMUSCULAR | Status: AC
  Administered 2015-01-17: 1000 ug via INTRAMUSCULAR

## 2015-02-09 ENCOUNTER — Other Ambulatory Visit: Payer: Self-pay | Admitting: Internal Medicine

## 2015-02-21 ENCOUNTER — Other Ambulatory Visit: Payer: Self-pay | Admitting: Internal Medicine

## 2015-02-22 ENCOUNTER — Ambulatory Visit (INDEPENDENT_AMBULATORY_CARE_PROVIDER_SITE_OTHER): Payer: Medicare PPO | Admitting: *Deleted

## 2015-02-22 DIAGNOSIS — E538 Deficiency of other specified B group vitamins: Secondary | ICD-10-CM | POA: Diagnosis not present

## 2015-02-22 MED ORDER — CYANOCOBALAMIN 1000 MCG/ML IJ SOLN
1000.0000 ug | Freq: Once | INTRAMUSCULAR | Status: AC
  Administered 2015-02-22: 1000 ug via INTRAMUSCULAR

## 2015-03-01 ENCOUNTER — Ambulatory Visit: Payer: Medicare PPO | Admitting: Internal Medicine

## 2015-03-27 ENCOUNTER — Encounter: Payer: Self-pay | Admitting: Internal Medicine

## 2015-03-27 ENCOUNTER — Ambulatory Visit (INDEPENDENT_AMBULATORY_CARE_PROVIDER_SITE_OTHER): Payer: Medicare PPO | Admitting: Internal Medicine

## 2015-03-27 VITALS — BP 119/72 | HR 98 | Temp 98.1°F | Ht 63.5 in | Wt 167.0 lb

## 2015-03-27 DIAGNOSIS — L409 Psoriasis, unspecified: Secondary | ICD-10-CM

## 2015-03-27 DIAGNOSIS — E538 Deficiency of other specified B group vitamins: Secondary | ICD-10-CM | POA: Diagnosis not present

## 2015-03-27 DIAGNOSIS — N200 Calculus of kidney: Secondary | ICD-10-CM | POA: Diagnosis not present

## 2015-03-27 DIAGNOSIS — E119 Type 2 diabetes mellitus without complications: Secondary | ICD-10-CM

## 2015-03-27 LAB — CBC WITH DIFFERENTIAL/PLATELET
Basophils Absolute: 0 10*3/uL (ref 0.0–0.1)
Basophils Relative: 0.6 % (ref 0.0–3.0)
EOS ABS: 0.1 10*3/uL (ref 0.0–0.7)
Eosinophils Relative: 2.4 % (ref 0.0–5.0)
HCT: 40.1 % (ref 36.0–46.0)
Hemoglobin: 13.6 g/dL (ref 12.0–15.0)
LYMPHS PCT: 41.5 % (ref 12.0–46.0)
Lymphs Abs: 2.1 10*3/uL (ref 0.7–4.0)
MCHC: 33.9 g/dL (ref 30.0–36.0)
MCV: 99.1 fl (ref 78.0–100.0)
MONO ABS: 0.5 10*3/uL (ref 0.1–1.0)
Monocytes Relative: 10.4 % (ref 3.0–12.0)
NEUTROS PCT: 45.1 % (ref 43.0–77.0)
Neutro Abs: 2.3 10*3/uL (ref 1.4–7.7)
Platelets: 168 10*3/uL (ref 150.0–400.0)
RBC: 4.04 Mil/uL (ref 3.87–5.11)
RDW: 14.4 % (ref 11.5–15.5)
WBC: 5 10*3/uL (ref 4.0–10.5)

## 2015-03-27 LAB — COMPREHENSIVE METABOLIC PANEL
ALT: 32 U/L (ref 0–35)
AST: 56 U/L — ABNORMAL HIGH (ref 0–37)
Albumin: 3.6 g/dL (ref 3.5–5.2)
Alkaline Phosphatase: 98 U/L (ref 39–117)
BUN: 10 mg/dL (ref 6–23)
CALCIUM: 9.8 mg/dL (ref 8.4–10.5)
CHLORIDE: 102 meq/L (ref 96–112)
CO2: 28 meq/L (ref 19–32)
CREATININE: 0.81 mg/dL (ref 0.40–1.20)
GFR: 74.06 mL/min (ref >60.00)
Glucose, Bld: 193 mg/dL — ABNORMAL HIGH (ref 70–99)
Potassium: 4.4 mEq/L (ref 3.5–5.1)
SODIUM: 137 meq/L (ref 135–145)
TOTAL PROTEIN: 7.5 g/dL (ref 6.0–8.3)
Total Bilirubin: 0.5 mg/dL (ref 0.2–1.2)

## 2015-03-27 LAB — HEMOGLOBIN A1C: Hgb A1c MFr Bld: 7.5 % — ABNORMAL HIGH (ref 4.6–6.5)

## 2015-03-27 MED ORDER — CYANOCOBALAMIN 1000 MCG/ML IJ SOLN
1000.0000 ug | Freq: Once | INTRAMUSCULAR | Status: AC
  Administered 2015-03-27: 1000 ug via INTRAMUSCULAR

## 2015-03-27 MED ORDER — CYANOCOBALAMIN 1000 MCG/ML IJ SOLN: 1000.0000 ug | INTRAMUSCULAR | Status: DC

## 2015-03-27 NOTE — Assessment & Plan Note (Addendum)
Persistent symptoms of peeling rash over palms. Plan for continued treatment with Dr. Marolyn Hammock and use of Cosentyx. Follow up prn.

## 2015-03-27 NOTE — Assessment & Plan Note (Signed)
Will check A1c with labs today. Continue Onglyza and Metformin

## 2015-03-27 NOTE — Patient Instructions (Signed)
Labs today.  Consider reading the book "Always Hungry" by Isabella Stalling.

## 2015-03-27 NOTE — Assessment & Plan Note (Signed)
B12 injection today 

## 2015-03-27 NOTE — Progress Notes (Signed)
Pre visit review using our clinic review tool, if applicable. No additional management support is needed unless otherwise documented below in the visit note. 

## 2015-03-27 NOTE — Progress Notes (Signed)
Subjective:    Patient ID: Caroline Gray, female    DOB: 03/01/1946, 69 y.o.   MRN: [id number]  HPI  [name] presents for follow up.  Left ureteral stent removed 3/14 by Dr. Jacqlyn Larsen. Planning to have right stent removed in future.  Psoriasis - Continues to have flare of psoriasis. Working with dermatology.  DM - BG have been elevated at times, but generally less than 200. Average 138-148. Compliant with medication.  Wt Readings from Last 3 Encounters:  03/27/15 167 lb (75.751 kg)  11/30/14 166 lb 4 oz (75.411 kg)  10/24/14 166 lb (75.297 kg)     Past medical, surgical, family and social history per today's encounter.  Review of Systems  Constitutional: Negative for fever, chills, appetite change, fatigue and unexpected weight change.  Eyes: Negative for visual disturbance.  Respiratory: Negative for shortness of breath.   Cardiovascular: Negative for chest pain and leg swelling.  Gastrointestinal: Negative for nausea, vomiting, abdominal pain, diarrhea and constipation.  Musculoskeletal: Negative for myalgias and arthralgias.  Skin: Positive for color change, rash and wound.  Hematological: Negative for adenopathy. Does not bruise/bleed easily.  Psychiatric/Behavioral: Negative for sleep disturbance and dysphoric mood. The patient is not nervous/anxious.        Objective:    BP 119/72 mmHg  Pulse 98  Temp(Src) 98.1 F (36.7 C) (Oral)  Ht 5' 3.5" (1.613 m)  Wt 167 lb (75.751 kg)  BMI 29.12 kg/m2  SpO2 95% Physical Exam  Constitutional: She is oriented to person, place, and time. She appears well-developed and well-nourished. No distress.  HENT:  Head: Normocephalic and atraumatic.  Right Ear: External ear normal.  Left Ear: External ear normal.  Nose: Nose normal.  Mouth/Throat: Oropharynx is clear and moist. No oropharyngeal exudate.  Eyes: Conjunctivae are normal. Pupils are equal, round, and reactive to light. Right eye exhibits no discharge. Left eye exhibits  no discharge. No scleral icterus.  Neck: Normal range of motion. Neck supple. No tracheal deviation present. No thyromegaly present.  Cardiovascular: Normal rate, regular rhythm, normal heart sounds and intact distal pulses.  Exam reveals no gallop and no friction rub.   No murmur heard. Pulmonary/Chest: Effort normal and breath sounds normal. No respiratory distress. She has no wheezes. She has no rales. She exhibits no tenderness.  Musculoskeletal: Normal range of motion. She exhibits no edema or tenderness.  Lymphadenopathy:    She has no cervical adenopathy.  Neurological: She is alert and oriented to person, place, and time. No cranial nerve deficit. She exhibits normal muscle tone. Coordination normal.  Skin: Skin is warm and dry. No rash noted. She is not diaphoretic. No erythema. No pallor.     Psychiatric: She has a normal mood and affect. Her behavior is normal. Judgment and thought content normal.          Assessment & Plan:   Problem List Items Addressed This Visit      Unprioritized   B12 deficiency    B12 injection today.      Relevant Medications   cyanocobalamin ((VITAMIN B-12)) injection 1,000 mcg (Completed)   Diabetes mellitus type 2, controlled - Primary    Will check A1c with labs today. Continue Onglyza and Metformin      Relevant Orders   Comprehensive metabolic panel   Hemoglobin A1c   Nephrolith    S/p left ureteral stent removal. Planning for removal of right stent. Reviewed notes from Urology. Follow up prn.      Psoriasis  Persistent symptoms of peeling rash over palms. Plan for continued treatment with Dr. Marolyn Hammock and use of Cosentyx. Follow up prn.      Relevant Orders   CBC with Differential/Platelet       Return in about 3 months (around 06/27/2015) for Recheck of Diabetes.

## 2015-03-27 NOTE — Assessment & Plan Note (Signed)
S/p left ureteral stent removal. Planning for removal of right stent. Reviewed notes from Urology. Follow up prn.

## 2015-03-28 ENCOUNTER — Ambulatory Visit: Payer: Medicare PPO

## 2015-03-29 ENCOUNTER — Other Ambulatory Visit: Payer: Self-pay | Admitting: Internal Medicine

## 2015-04-18 ENCOUNTER — Other Ambulatory Visit: Payer: Self-pay | Admitting: Internal Medicine

## 2015-06-26 ENCOUNTER — Ambulatory Visit: Payer: Medicare PPO | Admitting: Cardiovascular Disease

## 2015-07-03 ENCOUNTER — Encounter: Payer: Self-pay | Admitting: Cardiovascular Disease

## 2015-07-03 ENCOUNTER — Ambulatory Visit (INDEPENDENT_AMBULATORY_CARE_PROVIDER_SITE_OTHER): Payer: Medicare PPO | Admitting: Cardiovascular Disease

## 2015-07-03 VITALS — BP 110/62 | HR 96 | Ht 63.5 in | Wt 168.8 lb

## 2015-07-03 DIAGNOSIS — E785 Hyperlipidemia, unspecified: Secondary | ICD-10-CM

## 2015-07-03 DIAGNOSIS — I2510 Atherosclerotic heart disease of native coronary artery without angina pectoris: Secondary | ICD-10-CM | POA: Diagnosis not present

## 2015-07-03 DIAGNOSIS — I2584 Coronary atherosclerosis due to calcified coronary lesion: Secondary | ICD-10-CM

## 2015-07-03 NOTE — Assessment & Plan Note (Signed)
The patient has no symptoms of angina and nuclear stress test in 2015 was normal. I recommend continuing aggressive treatment of risk factors and low dose aspirin.

## 2015-07-03 NOTE — Progress Notes (Signed)
Primary care physician: Dr. Ronette Deter  HPI  This is a pleasant 69 year old female who is here today for a follow-up visit regarding nonobstructive coronary atherosclerosis noted on CT scan.  She had a normal nuclear stress test in 2015. She has chronic medical conditions that include type 2 diabetes which was diagnosed in the early 90s, hypertension, hyperlipidemia and severe psoriasis. She also has family history of coronary artery disease. One of her brothers had massive myocardial infarction at the age of 2. Another brother had 5 cardiac stents.  She continues to be stable from a cardiac standpoint with only rare episodes of random chest pain not necessarily exertional. Dyspnea is stable. She continues to suffer from psoriasis.  Allergies  Allergen Reactions  . Codeine     Nausea and vomiting  . Influenza Vaccines     Kandis Mannan  . Ioxaglate     Shaking and rapid heart beat  . Ivp Dye [Iodinated Diagnostic Agents]     Shaking and rapid heart beat  . Macrodantin     Vomiting and nausea  . Nitrofurantoin Other (See Comments)     Current Outpatient Prescriptions on File Prior to Visit  Medication Sig Dispense Refill  . aspirin 81 MG tablet Take 1 tablet (81 mg total) by mouth daily. 30 tablet   . Calcium Citrate-Vitamin D (CALCIUM CITRATE + D PO) Take 1 capsule by mouth 2 (two) times daily.      . cyanocobalamin (,VITAMIN B-12,) 1000 MCG/ML injection Inject 1 mL (1,000 mcg total) into the muscle every 30 (thirty) days. 30 mL 0  . gabapentin (NEURONTIN) 300 MG capsule TAKE ONE CAPSULE BY MOUTH THREE TIMES DAILY 90 capsule 9  . glipiZIDE (GLUCOTROL XL) 10 MG 24 hr tablet TAKE ONE TABLET BY MOUTH ONE TIME DAILY  90 tablet 1  . glucose blood (ONE TOUCH TEST STRIPS) test strip Use as instructed 100 each 12  . glucose blood test strip 1 each by Other route as needed for other. Test 2 times daily with Freestyle Lite monitor    . metFORMIN (GLUCOPHAGE) 1000 MG tablet TAKE ONE  TABLET BY MOUTH ONE TIME DAILY  90 tablet 1  . Multiple Vitamin (MULTIVITAMIN) capsule Take 1 capsule by mouth daily.      . ONGLYZA 2.5 MG TABS tablet TAKE ONE TABLET BY MOUTH ONE TIME DAILY 90 tablet 2  . simvastatin (ZOCOR) 40 MG tablet TAKE ONE TABLET BY MOUTH NIGHTLY AT BEDTIME 90 tablet 3   No current facility-administered medications on file prior to visit.     Past Medical History  Diagnosis Date  . Diabetes mellitus   . Hyperlipidemia   . Psoriasis   . Guillain-Barre   . Hypertension   . Positive PPD   . Adenomatous colon polyp   . Diverticulosis   . Arthritis   . IBS (irritable bowel syndrome)   . History of kidney stones   . Hx: UTI (urinary tract infection)   . Cataract      Past Surgical History  Procedure Laterality Date  . Lithotripsy  3.7.13    Dr Jacqlyn Larsen  . Rotator cuff repair  08/2005  . Spine surgery    . Abdominal hysterectomy    . Cholecystectomy    . Appendectomy    . Cataract extraction       Family History  Problem Relation Age of Onset  . Diabetes Mother   . Colon cancer Neg Hx   . Colon polyps Neg Hx   .  Rectal cancer Neg Hx   . Stomach cancer Neg Hx   . Other Son     Ulcerative Proctitis   . Heart attack Paternal Aunt   . Heart attack Paternal Uncle   . Heart attack Paternal Uncle   . Heart attack Paternal Uncle   . Heart attack Paternal Uncle   . Heart attack Paternal Aunt   . Heart attack Paternal Aunt   . Heart attack Paternal Aunt      Social History   Social History  . Marital Status: Married    Spouse Name: N/A  . Number of Children: N/A  . Years of Education: N/A   Occupational History  . Not on file.   Social History Main Topics  . Smoking status: Former Smoker -- 15 years    Types: Cigarettes    Quit date: 11/19/1999  . Smokeless tobacco: Never Used  . Alcohol Use: Yes     Comment: occassionaly  . Drug Use: No  . Sexual Activity: Not on file   Other Topics Concern  . Not on file   Social History  Narrative     ROS A 10 point review of system was performed. It is negative other than that mentioned in the history of present illness.   PHYSICAL EXAM   BP 110/62 mmHg  Pulse 96  Ht 5' 3.5" (1.613 m)  Wt 168 lb 12 oz (76.544 kg)  BMI 29.42 kg/m2 Constitutional: She is oriented to person, place, and time. She appears well-developed and well-nourished. No distress.  HENT: No nasal discharge.  Head: Normocephalic and atraumatic.  Eyes: Pupils are equal and round. No discharge.  Neck: Normal range of motion. Neck supple. No JVD present. No thyromegaly present.  Cardiovascular: Normal rate, regular rhythm, normal heart sounds. Exam reveals no gallop and no friction rub. No murmur heard.  Pulmonary/Chest: Effort normal and breath sounds normal. No stridor. No respiratory distress. She has no wheezes. She has no rales. She exhibits no tenderness.  Abdominal: Soft. Bowel sounds are normal. She exhibits no distension. There is no tenderness. There is no rebound and no guarding.  Musculoskeletal: Normal range of motion. She exhibits no edema and no tenderness.  Neurological: She is alert and oriented to person, place, and time. Coordination normal.  Skin: Skin is warm and dry. No rash noted. She is not diaphoretic. No erythema. No pallor.  Psychiatric: She has a normal mood and affect. Her behavior is normal. Judgment and thought content normal.     [id number]  Rhythm  WITHIN NORMAL LIMITS   ASSESSMENT AND PLAN

## 2015-07-03 NOTE — Assessment & Plan Note (Signed)
Lab Results  Component Value Date   CHOL 133 11/30/2014   HDL 35.20* 11/30/2014   LDLCALC 62 11/30/2014        TRIG 181.0* 11/30/2014   CHOLHDL 4 11/30/2014   LDL was at target of less than 70. Continue treatment with simvastatin.

## 2015-07-03 NOTE — Patient Instructions (Signed)
Medication Instructions: Continue same medications.   Labwork: None.   Procedures/Testing: None.   Follow-Up: 1 year with Dr. Siddhant Hashemi  Any Additional Special Instructions Will Be Listed Below (If Applicable).   

## 2015-07-06 ENCOUNTER — Encounter: Payer: Self-pay | Admitting: Internal Medicine

## 2015-07-06 ENCOUNTER — Ambulatory Visit (INDEPENDENT_AMBULATORY_CARE_PROVIDER_SITE_OTHER): Payer: Medicare PPO | Admitting: Internal Medicine

## 2015-07-06 VITALS — BP 137/84 | HR 97 | Temp 98.0°F | Ht 63.5 in | Wt 169.2 lb

## 2015-07-06 DIAGNOSIS — L409 Psoriasis, unspecified: Secondary | ICD-10-CM | POA: Diagnosis not present

## 2015-07-06 DIAGNOSIS — E785 Hyperlipidemia, unspecified: Secondary | ICD-10-CM | POA: Diagnosis not present

## 2015-07-06 DIAGNOSIS — E119 Type 2 diabetes mellitus without complications: Secondary | ICD-10-CM

## 2015-07-06 LAB — HEMOGLOBIN A1C: HEMOGLOBIN A1C: 8.6 % — AB (ref 4.6–6.5)

## 2015-07-06 LAB — MICROALBUMIN / CREATININE URINE RATIO
CREATININE, U: 125.2 mg/dL
MICROALB UR: 5.1 mg/dL — AB (ref 0.0–1.9)
MICROALB/CREAT RATIO: 4.1 mg/g (ref 0.0–30.0)

## 2015-07-06 LAB — LIPID PANEL
CHOLESTEROL: 137 mg/dL (ref 0–200)
HDL: 33.3 mg/dL — ABNORMAL LOW (ref >39.00)
LDL Cholesterol: 75 mg/dL (ref 0–99)
NONHDL: 103.72
Total CHOL/HDL Ratio: 4
Triglycerides: 142 mg/dL (ref 0.0–149.0)
VLDL: 28.4 mg/dL (ref 0.0–40.0)

## 2015-07-06 LAB — COMPREHENSIVE METABOLIC PANEL
ALBUMIN: 3.7 g/dL (ref 3.5–5.2)
ALT: 25 U/L (ref 0–35)
AST: 41 U/L — ABNORMAL HIGH (ref 0–37)
Alkaline Phosphatase: 99 U/L (ref 39–117)
BUN: 11 mg/dL (ref 6–23)
CHLORIDE: 103 meq/L (ref 96–112)
CO2: 30 mEq/L (ref 19–32)
CREATININE: 0.82 mg/dL (ref 0.40–1.20)
Calcium: 9.8 mg/dL (ref 8.4–10.5)
GFR: 72.96 mL/min (ref >60.00)
Glucose, Bld: 155 mg/dL — ABNORMAL HIGH (ref 70–99)
Potassium: 4.3 mEq/L (ref 3.5–5.1)
SODIUM: 139 meq/L (ref 135–145)
Total Bilirubin: 0.5 mg/dL (ref 0.2–1.2)
Total Protein: 7.8 g/dL (ref 6.0–8.3)

## 2015-07-06 NOTE — Progress Notes (Signed)
Subjective:    Patient ID: Caroline Gray, female    DOB: Apr 13, 1946, 69 y.o.   MRN: [id number]  HPI  69YO female presents for follow up.  DM - Compliant with medication. BG up some. Unable to exercise because of burning pain on psoriasis meds. Only one BG of 200.  Psoriasis - Continues on Cosentyx and Soriatane. Recently increased Soriatane with no improvement. Has some nausea with this. Follow up scheduled end of month.    Wt Readings from Last 3 Encounters:  07/06/15 169 lb 4 oz (76.771 kg)  07/03/15 168 lb 12 oz (76.544 kg)  03/27/15 167 lb (75.751 kg)     Past Medical History  Diagnosis Date  . Diabetes mellitus   . Hyperlipidemia   . Psoriasis   . Guillain-Barre   . Hypertension   . Positive PPD   . Adenomatous colon polyp   . Diverticulosis   . Arthritis   . IBS (irritable bowel syndrome)   . History of kidney stones   . Hx: UTI (urinary tract infection)   . Cataract    Family History  Problem Relation Age of Onset  . Diabetes Mother   . Colon cancer Neg Hx   . Colon polyps Neg Hx   . Rectal cancer Neg Hx   . Stomach cancer Neg Hx   . Other Son     Ulcerative Proctitis   . Heart attack Paternal Aunt   . Heart attack Paternal Uncle   . Heart attack Paternal Uncle   . Heart attack Paternal Uncle   . Heart attack Paternal Uncle   . Heart attack Paternal Aunt   . Heart attack Paternal Aunt   . Heart attack Paternal Aunt    Past Surgical History  Procedure Laterality Date  . Lithotripsy  3.7.13    Dr Jacqlyn Larsen  . Rotator cuff repair  08/2005  . Spine surgery    . Abdominal hysterectomy    . Cholecystectomy    . Appendectomy    . Cataract extraction     Social History   Social History  . Marital Status: Married    Spouse Name: N/A  . Number of Children: N/A  . Years of Education: N/A   Social History Main Topics  . Smoking status: Former Smoker -- 15 years    Types: Cigarettes    Quit date: 11/19/1999  . Smokeless tobacco: Never Used  . Alcohol  Use: Yes     Comment: occassionaly  . Drug Use: No  . Sexual Activity: Not Asked   Other Topics Concern  . None   Social History Narrative    Review of Systems  Constitutional: Negative for fever, chills, appetite change, fatigue and unexpected weight change.  Eyes: Negative for visual disturbance.  Respiratory: Negative for shortness of breath.   Cardiovascular: Negative for chest pain and leg swelling.  Gastrointestinal: Positive for nausea. Negative for vomiting, abdominal pain, diarrhea and constipation.  Musculoskeletal: Positive for myalgias and arthralgias.  Skin: Positive for color change and rash.  Neurological: Negative for weakness and numbness.  Hematological: Negative for adenopathy. Does not bruise/bleed easily.  Psychiatric/Behavioral: Negative for sleep disturbance and dysphoric mood. The patient is not nervous/anxious.        Objective:    BP 137/84 mmHg  Pulse 97  Temp(Src) 98 F (36.7 C) (Oral)  Ht 5' 3.5" (1.613 m)  Wt 169 lb 4 oz (76.771 kg)  BMI 29.51 kg/m2  SpO2 95% Physical Exam  Constitutional: She  is oriented to person, place, and time. She appears well-developed and well-nourished. No distress.  HENT:  Head: Normocephalic and atraumatic.  Right Ear: External ear normal.  Left Ear: External ear normal.  Nose: Nose normal.  Mouth/Throat: Oropharynx is clear and moist. No oropharyngeal exudate.  Eyes: Conjunctivae are normal. Pupils are equal, round, and reactive to light. Right eye exhibits no discharge. Left eye exhibits no discharge. No scleral icterus.  Neck: Normal range of motion. Neck supple. No tracheal deviation present. No thyromegaly present.  Cardiovascular: Normal rate, regular rhythm, normal heart sounds and intact distal pulses.  Exam reveals no gallop and no friction rub.   No murmur heard. Pulmonary/Chest: Effort normal and breath sounds normal. No respiratory distress. She has no wheezes. She has no rales. She exhibits no  tenderness.  Musculoskeletal: Normal range of motion. She exhibits no edema or tenderness.  Lymphadenopathy:    She has no cervical adenopathy.  Neurological: She is alert and oriented to person, place, and time. No cranial nerve deficit. She exhibits normal muscle tone. Coordination normal.  Skin: Skin is warm and dry. Rash noted. Rash is papular. She is not diaphoretic. No erythema. No pallor.  Plaque psoriasis with peeling and erythema over palms and soles  Psychiatric: She has a normal mood and affect. Her behavior is normal. Judgment and thought content normal.          Assessment & Plan:   Problem List Items Addressed This Visit      Unprioritized   Diabetes mellitus type 2, controlled - Primary    BG increased some recently. Will check A1c with labs. Continue Metformin, Glipizide and Onglyza.      Relevant Orders   Comprehensive metabolic panel   Hemoglobin A1c   Microalbumin / creatinine urine ratio   Hyperlipidemia    Will recheck CMP and lipids today. Continue Simvastatin.      Relevant Orders   Lipid panel   Psoriasis    Symptoms poorly controlled on Soriatane and Cosentyx. Follow up with dermatology pending.          Return in about 3 months (around 10/06/2015) for Recheck of Diabetes.

## 2015-07-06 NOTE — Assessment & Plan Note (Signed)
Will recheck CMP and lipids today. Continue Simvastatin.

## 2015-07-06 NOTE — Assessment & Plan Note (Signed)
BG increased some recently. Will check A1c with labs. Continue Metformin, Glipizide and Onglyza.

## 2015-07-06 NOTE — Patient Instructions (Signed)
Labs today.   Follow up in 3 months.  

## 2015-07-06 NOTE — Assessment & Plan Note (Signed)
Symptoms poorly controlled on Soriatane and Cosentyx. Follow up with dermatology pending.

## 2015-07-06 NOTE — Progress Notes (Signed)
Pre visit review using our clinic review tool, if applicable. No additional management support is needed unless otherwise documented below in the visit note. 

## 2015-07-27 ENCOUNTER — Ambulatory Visit (INDEPENDENT_AMBULATORY_CARE_PROVIDER_SITE_OTHER): Payer: Medicare PPO | Admitting: Internal Medicine

## 2015-07-27 ENCOUNTER — Encounter: Payer: Self-pay | Admitting: Internal Medicine

## 2015-07-27 VITALS — BP 128/76 | HR 104 | Temp 97.9°F | Ht 63.5 in | Wt 169.0 lb

## 2015-07-27 DIAGNOSIS — E538 Deficiency of other specified B group vitamins: Secondary | ICD-10-CM

## 2015-07-27 DIAGNOSIS — E1165 Type 2 diabetes mellitus with hyperglycemia: Secondary | ICD-10-CM | POA: Diagnosis not present

## 2015-07-27 DIAGNOSIS — IMO0002 Reserved for concepts with insufficient information to code with codable children: Secondary | ICD-10-CM

## 2015-07-27 MED ORDER — INSULIN PEN NEEDLE 33G X 8 MM MISC: 1.0000 "application " | Freq: Every day | Status: DC

## 2015-07-27 MED ORDER — CYANOCOBALAMIN 1000 MCG/ML IJ SOLN
1000.0000 ug | Freq: Once | INTRAMUSCULAR | Status: AC
  Administered 2015-07-27: 1000 ug via INTRAMUSCULAR

## 2015-07-27 MED ORDER — INSULIN GLARGINE 100 UNIT/ML SOLOSTAR PEN: 10.0000 [IU] | PEN_INJECTOR | Freq: Every day | SUBCUTANEOUS | Status: DC

## 2015-07-27 NOTE — Progress Notes (Signed)
Subjective:    Patient ID: Caroline Gray, female    DOB: [birth year], 69 y.o.   MRN: [id number]  HPI  69YO female presents for follow up.  DM - Recent A1c was 8.6%. BG have been 20-40pts higher than previously. Compliant with medications. Control of BG has been more difficult with changes in medications for psoriasis. Having some nausea with Onglyza.   Wt Readings from Last 3 Encounters:  07/27/15 169 lb (76.658 kg)  07/06/15 169 lb 4 oz (76.771 kg)  07/03/15 168 lb 12 oz (76.544 kg)   BP Readings from Last 3 Encounters:  07/27/15 128/76  07/06/15 137/84  07/03/15 110/62     Past Medical History  Diagnosis Date  . Diabetes mellitus   . Hyperlipidemia   . Psoriasis   . Guillain-Barre   . Hypertension   . Positive PPD   . Adenomatous colon polyp   . Diverticulosis   . Arthritis   . IBS (irritable bowel syndrome)   . History of kidney stones   . Hx: UTI (urinary tract infection)   . Cataract    Family History  Problem Relation Age of Onset  . Diabetes Mother   . Colon cancer Neg Hx   . Colon polyps Neg Hx   . Rectal cancer Neg Hx   . Stomach cancer Neg Hx   . Other Son     Ulcerative Proctitis   . Heart attack Paternal Aunt   . Heart attack Paternal Uncle   . Heart attack Paternal Uncle   . Heart attack Paternal Uncle   . Heart attack Paternal Uncle   . Heart attack Paternal Aunt   . Heart attack Paternal Aunt   . Heart attack Paternal Aunt    Past Surgical History  Procedure Laterality Date  . Lithotripsy  3.7.13    Dr Jacqlyn Larsen  . Rotator cuff repair  08/2005  . Spine surgery    . Abdominal hysterectomy    . Cholecystectomy    . Appendectomy    . Cataract extraction     Social History   Social History  . Marital Status: Married    Spouse Name: N/A  . Number of Children: N/A  . Years of Education: N/A   Social History Main Topics  . Smoking status: Former Smoker -- 15 years    Types: Cigarettes    Quit date: 11/19/1999  . Smokeless tobacco: Never  Used  . Alcohol Use: Yes     Comment: occassionaly  . Drug Use: No  . Sexual Activity: Not Asked   Other Topics Concern  . None   Social History Narrative    Review of Systems  Constitutional: Positive for fatigue. Negative for fever, chills, appetite change and unexpected weight change.  Eyes: Negative for visual disturbance.  Respiratory: Negative for shortness of breath.   Cardiovascular: Negative for chest pain and leg swelling.  Gastrointestinal: Positive for nausea. Negative for abdominal pain, diarrhea and constipation.  Endocrine: Negative for polydipsia, polyphagia and polyuria.  Skin: Negative for color change and rash.  Hematological: Negative for adenopathy. Does not bruise/bleed easily.  Psychiatric/Behavioral: Positive for dysphoric mood. The patient is nervous/anxious.        Objective:    BP 128/76 mmHg  Pulse 104  Temp(Src) 97.9 F (36.6 C) (Oral)  Ht 5' 3.5" (1.613 m)  Wt 169 lb (76.658 kg)  BMI 29.46 kg/m2  SpO2 94% Physical Exam  Constitutional: She is oriented to person, place, and time. She appears  well-developed and well-nourished. No distress.  HENT:  Head: Normocephalic and atraumatic.  Right Ear: External ear normal.  Left Ear: External ear normal.  Nose: Nose normal.  Mouth/Throat: Oropharynx is clear and moist. No oropharyngeal exudate.  Eyes: Conjunctivae are normal. Pupils are equal, round, and reactive to light. Right eye exhibits no discharge. Left eye exhibits no discharge. No scleral icterus.  Neck: Normal range of motion. Neck supple. No tracheal deviation present. No thyromegaly present.  Cardiovascular: Normal rate, regular rhythm, normal heart sounds and intact distal pulses.  Exam reveals no gallop and no friction rub.   No murmur heard. Pulmonary/Chest: Effort normal and breath sounds normal. No respiratory distress. She has no wheezes. She has no rales. She exhibits no tenderness.  Musculoskeletal: Normal range of motion. She  exhibits no edema or tenderness.  Lymphadenopathy:    She has no cervical adenopathy.  Neurological: She is alert and oriented to person, place, and time. No cranial nerve deficit. She exhibits normal muscle tone. Coordination normal.  Skin: Skin is warm and dry. No rash noted. She is not diaphoretic. No erythema. No pallor.  Psychiatric: Her speech is normal and behavior is normal. Judgment and thought content normal. Her mood appears anxious. She exhibits a depressed mood.          Assessment & Plan:  Over 10min of which >50% spent in face-to-face contact with patient discussing plan of care  Problem List Items Addressed This Visit      Unprioritized   Diabetes mellitus type 2, uncontrolled - Primary    BG poorly controlled. Will stop Glipizide and Onglzya. Continue Metformin. Start Lantus insulin 10 units daily. Plan to titrate up dose over next few weeks. She will email weekly with BG readings. Follow up in 4 weeks.      Relevant Medications   Insulin Glargine (LANTUS SOLOSTAR) 100 UNIT/ML Solostar Pen   Insulin Pen Needle 33G X 8 MM MISC       Return in about 4 weeks (around 08/24/2015) for Recheck of Diabetes.

## 2015-07-27 NOTE — Patient Instructions (Addendum)
Start Lantus 10 units daily injected under the skin.  Stop Glipizide and Onglyza.  Email weekly with blood sugars.  Follow up 4 weeks.

## 2015-07-27 NOTE — Progress Notes (Signed)
Pre visit review using our clinic review tool, if applicable. No additional management support is needed unless otherwise documented below in the visit note. 

## 2015-07-27 NOTE — Assessment & Plan Note (Signed)
BG poorly controlled. Will stop Glipizide and Onglzya. Continue Metformin. Start Lantus insulin 10 units daily. Plan to titrate up dose over next few weeks. She will email weekly with BG readings. Follow up in 4 weeks.

## 2015-07-27 NOTE — Addendum Note (Signed)
Addended by: Vernetta Honey on: 07/27/2015 09:44 AM   Modules accepted: Orders

## 2015-08-01 ENCOUNTER — Ambulatory Visit: Payer: Medicare PPO

## 2015-08-02 ENCOUNTER — Other Ambulatory Visit: Payer: Self-pay | Admitting: Internal Medicine

## 2015-08-04 ENCOUNTER — Encounter: Payer: Self-pay | Admitting: Internal Medicine

## 2015-08-18 ENCOUNTER — Encounter: Payer: Self-pay | Admitting: Internal Medicine

## 2015-08-25 ENCOUNTER — Encounter: Payer: Self-pay | Admitting: Internal Medicine

## 2015-09-01 ENCOUNTER — Ambulatory Visit (INDEPENDENT_AMBULATORY_CARE_PROVIDER_SITE_OTHER): Payer: Medicare PPO | Admitting: Internal Medicine

## 2015-09-01 ENCOUNTER — Encounter: Payer: Self-pay | Admitting: Internal Medicine

## 2015-09-01 VITALS — BP 116/67 | HR 79 | Temp 97.7°F | Ht 64.0 in | Wt 169.0 lb

## 2015-09-01 DIAGNOSIS — Z794 Long term (current) use of insulin: Secondary | ICD-10-CM

## 2015-09-01 DIAGNOSIS — G629 Polyneuropathy, unspecified: Secondary | ICD-10-CM

## 2015-09-01 DIAGNOSIS — IMO0002 Reserved for concepts with insufficient information to code with codable children: Secondary | ICD-10-CM

## 2015-09-01 DIAGNOSIS — E1140 Type 2 diabetes mellitus with diabetic neuropathy, unspecified: Secondary | ICD-10-CM | POA: Diagnosis not present

## 2015-09-01 DIAGNOSIS — E538 Deficiency of other specified B group vitamins: Secondary | ICD-10-CM | POA: Diagnosis not present

## 2015-09-01 DIAGNOSIS — E1165 Type 2 diabetes mellitus with hyperglycemia: Secondary | ICD-10-CM

## 2015-09-01 MED ORDER — CYANOCOBALAMIN 1000 MCG/ML IJ SOLN
1000.0000 ug | Freq: Once | INTRAMUSCULAR | Status: AC
  Administered 2015-09-01: 1000 ug via INTRAMUSCULAR

## 2015-09-01 MED ORDER — INSULIN GLARGINE 100 UNIT/ML SOLOSTAR PEN: 20.0000 [IU] | PEN_INJECTOR | Freq: Every day | SUBCUTANEOUS | Status: DC

## 2015-09-01 NOTE — Progress Notes (Signed)
Subjective:    Patient ID: Caroline Gray, female    DOB: Sep 15, 1946, 69 y.o.   MRN: [id number]  HPI  69YO female presents for follow up.  DM - BG running 120-140s fasting and 150-200 after meals. Recently has traveled. Compliant with insulin.  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 Symptoms generally well controlled with Neurontin 300mg  bid. During winter, she increases to tid given worsening pain. No side effects noted from medication.   Wt Readings from Last 3 Encounters:  09/01/15 169 lb (76.658 kg)  07/27/15 169 lb (76.658 kg)  07/06/15 169 lb 4 oz (76.771 kg)   BP Readings from Last 3 Encounters:  09/01/15 116/67  07/27/15 128/76  07/06/15 137/84    Past Medical History  Diagnosis Date  . Diabetes mellitus   . Hyperlipidemia   . Psoriasis   . Guillain-Barre ([place])   . Hypertension   . Positive PPD   . Adenomatous colon polyp   . Diverticulosis   . Arthritis   . IBS (irritable bowel syndrome)   . History of kidney stones   . Hx: UTI (urinary tract infection)   . Cataract    Family History  Problem Relation Age of Onset  . Diabetes Mother   . Colon cancer Neg Hx   . Colon polyps Neg Hx   . Rectal cancer Neg Hx   . Stomach cancer Neg Hx   . Other Son     Ulcerative Proctitis   . Heart attack Paternal Aunt   . Heart attack Paternal Uncle   . Heart attack Paternal Uncle   . Heart attack Paternal Uncle   . Heart attack Paternal Uncle   . Heart attack Paternal Aunt   . Heart attack Paternal Aunt   . Heart attack Paternal Aunt    Past Surgical History  Procedure Laterality Date  . Lithotripsy  3.7.13    Dr Jacqlyn Larsen  . Rotator cuff repair  08/2005  . Spine surgery    . Abdominal hysterectomy    . Cholecystectomy    . Appendectomy    . Cataract extraction     Social History   Social History  . Marital Status: Married    Spouse Name: N/A  . Number of Children: N/A  . Years of Education: N/A   Social History Main Topics  . Smoking  status: Former Smoker -- 15 years    Types: Cigarettes    Quit date: 11/19/1999  . Smokeless tobacco: Never Used  . Alcohol Use: Yes     Comment: occassionaly  . Drug Use: No  . Sexual Activity: Not Asked   Other Topics Concern  . None   Social History Narrative    Review of Systems  Constitutional: Negative for fever, chills, appetite change, fatigue and unexpected weight change.  Eyes: Negative for visual disturbance.  Respiratory: Negative for shortness of breath.   Cardiovascular: Negative for chest pain and leg swelling.  Gastrointestinal: Negative for nausea, vomiting, abdominal pain, diarrhea and constipation.  Skin: Negative for color change and rash.  Hematological: Negative for adenopathy. Does not bruise/bleed easily.  Psychiatric/Behavioral: Negative for sleep disturbance and dysphoric mood. The patient is not nervous/anxious.        Objective:    BP 116/67 mmHg  Pulse 79  Temp(Src) 97.7 F (36.5 C) (Oral)  Ht 5\' 4"  (1.626 m)  Wt 169 lb (76.658 kg)  BMI 28.99 kg/m2  SpO2 96% Physical Exam  Constitutional: She is oriented to  person, place, and time. She appears well-developed and well-nourished. No distress.  HENT:  Head: Normocephalic and atraumatic.  Right Ear: External ear normal.  Left Ear: External ear normal.  Nose: Nose normal.  Mouth/Throat: Oropharynx is clear and moist. No oropharyngeal exudate.  Eyes: Conjunctivae are normal. Pupils are equal, round, and reactive to light. Right eye exhibits no discharge. Left eye exhibits no discharge. No scleral icterus.  Neck: Normal range of motion. Neck supple. No tracheal deviation present. No thyromegaly present.  Cardiovascular: Normal rate, regular rhythm, normal heart sounds and intact distal pulses.  Exam reveals no gallop and no friction rub.   No murmur heard. Pulmonary/Chest: Effort normal and breath sounds normal. No respiratory distress. She has no wheezes. She has no rales. She exhibits no  tenderness.  Musculoskeletal: Normal range of motion. She exhibits no edema or tenderness.  Lymphadenopathy:    She has no cervical adenopathy.  Neurological: She is alert and oriented to person, place, and time. No cranial nerve deficit. She exhibits normal muscle tone. Coordination normal.  Skin: Skin is warm and dry. No rash noted. She is not diaphoretic. No erythema. No pallor.  Psychiatric: She has a normal mood and affect. Her behavior is normal. Judgment and thought content normal.          Assessment & Plan:   Problem List Items Addressed This Visit      Unprioritized   Diabetes mellitus type 2, uncontrolled ([place]) - Primary    BG continue to be elevated. Will increase Lantus to 20units daily. She will call if any problems or if BG<70. Follow up in 3 months with repeat A1c.      Relevant Medications   Insulin Glargine (LANTUS SOLOSTAR) 100 UNIT/ML Solostar Pen   Neuropathy (HCC)    Symptoms of burning pain over right face 2/2 herpes zoster. Improved with use of Neurontin. Will continue.          Return in about 3 months (around 12/02/2015) for Recheck.

## 2015-09-01 NOTE — Patient Instructions (Addendum)
Increase Lantus to 20units daily.  Call if any blood sugars less than 70.

## 2015-09-01 NOTE — Addendum Note (Signed)
Addended by: Vernetta Honey on: 09/01/2015 02:31 PM   Modules accepted: Orders

## 2015-09-01 NOTE — Assessment & Plan Note (Signed)
Symptoms of burning pain over right face 2/2 herpes zoster. Improved with use of Neurontin. Will continue.

## 2015-09-01 NOTE — Assessment & Plan Note (Signed)
BG continue to be elevated. Will increase Lantus to 20units daily. She will call if any problems or if BG<70. Follow up in 3 months with repeat A1c.

## 2015-09-08 ENCOUNTER — Encounter: Payer: Self-pay | Admitting: Internal Medicine

## 2015-09-15 ENCOUNTER — Encounter: Payer: Self-pay | Admitting: Internal Medicine

## 2015-09-22 ENCOUNTER — Encounter: Payer: Self-pay | Admitting: Internal Medicine

## 2015-09-28 ENCOUNTER — Encounter: Payer: Self-pay | Admitting: Internal Medicine

## 2015-09-28 ENCOUNTER — Other Ambulatory Visit: Payer: Self-pay

## 2015-09-28 DIAGNOSIS — E1140 Type 2 diabetes mellitus with diabetic neuropathy, unspecified: Secondary | ICD-10-CM

## 2015-09-28 DIAGNOSIS — IMO0002 Reserved for concepts with insufficient information to code with codable children: Secondary | ICD-10-CM

## 2015-09-28 DIAGNOSIS — E1165 Type 2 diabetes mellitus with hyperglycemia: Principal | ICD-10-CM

## 2015-09-28 DIAGNOSIS — Z794 Long term (current) use of insulin: Principal | ICD-10-CM

## 2015-09-28 MED ORDER — INSULIN GLARGINE 100 UNIT/ML SOLOSTAR PEN: 30.0000 [IU] | PEN_INJECTOR | Freq: Every day | SUBCUTANEOUS | Status: DC

## 2015-09-29 ENCOUNTER — Encounter: Payer: Self-pay | Admitting: Internal Medicine

## 2015-10-04 ENCOUNTER — Ambulatory Visit (INDEPENDENT_AMBULATORY_CARE_PROVIDER_SITE_OTHER): Payer: Medicare PPO

## 2015-10-04 DIAGNOSIS — E538 Deficiency of other specified B group vitamins: Secondary | ICD-10-CM

## 2015-10-04 MED ORDER — CYANOCOBALAMIN 1000 MCG/ML IJ SOLN
1000.0000 ug | Freq: Once | INTRAMUSCULAR | Status: AC
  Administered 2015-10-04: 1000 ug via INTRAMUSCULAR

## 2015-10-04 NOTE — Progress Notes (Signed)
Patient came in for b12 injection.  Received in Left deltoid.  Patient tolerated well  

## 2015-10-06 ENCOUNTER — Ambulatory Visit: Payer: Medicare PPO | Admitting: Internal Medicine

## 2015-10-09 LAB — HM DIABETES EYE EXAM

## 2015-10-14 ENCOUNTER — Encounter: Payer: Self-pay | Admitting: Internal Medicine

## 2015-10-18 ENCOUNTER — Telehealth: Payer: Self-pay

## 2015-10-18 ENCOUNTER — Other Ambulatory Visit: Payer: Self-pay

## 2015-10-18 DIAGNOSIS — E1165 Type 2 diabetes mellitus with hyperglycemia: Principal | ICD-10-CM

## 2015-10-18 DIAGNOSIS — IMO0002 Reserved for concepts with insufficient information to code with codable children: Secondary | ICD-10-CM

## 2015-10-18 DIAGNOSIS — E1140 Type 2 diabetes mellitus with diabetic neuropathy, unspecified: Secondary | ICD-10-CM

## 2015-10-18 DIAGNOSIS — Z794 Long term (current) use of insulin: Principal | ICD-10-CM

## 2015-10-18 MED ORDER — INSULIN GLARGINE 100 UNIT/ML SOLOSTAR PEN: 30.0000 [IU] | PEN_INJECTOR | Freq: Every day | SUBCUTANEOUS | Status: DC

## 2015-10-18 NOTE — Telephone Encounter (Signed)
Left message for patient to call back regarding the dose of her lantus. The pharmacy is questioning her dose of Lantus.

## 2015-10-18 NOTE — Telephone Encounter (Signed)
She has been titrating up on her dose at home, so may be taking 30units. I would confirm with her.

## 2015-10-18 NOTE — Telephone Encounter (Signed)
Is patient taking Lantus 30units QHS or 20?    09/01/2015 telephone note states 20 units

## 2015-10-19 ENCOUNTER — Encounter: Payer: Self-pay | Admitting: Internal Medicine

## 2015-11-07 ENCOUNTER — Ambulatory Visit (INDEPENDENT_AMBULATORY_CARE_PROVIDER_SITE_OTHER): Payer: Medicare PPO | Admitting: *Deleted

## 2015-11-07 DIAGNOSIS — E538 Deficiency of other specified B group vitamins: Secondary | ICD-10-CM

## 2015-11-07 MED ORDER — CYANOCOBALAMIN 1000 MCG/ML IJ SOLN
1000.0000 ug | Freq: Once | INTRAMUSCULAR | Status: AC
  Administered 2015-11-07: 1000 ug via INTRAMUSCULAR

## 2015-11-07 NOTE — Progress Notes (Signed)
Patient presented for B 12 injection to right deltoid and tolerated well.

## 2015-12-05 ENCOUNTER — Ambulatory Visit: Payer: Medicare PPO | Admitting: Internal Medicine

## 2015-12-12 ENCOUNTER — Encounter: Payer: Self-pay | Admitting: Internal Medicine

## 2015-12-12 ENCOUNTER — Ambulatory Visit (INDEPENDENT_AMBULATORY_CARE_PROVIDER_SITE_OTHER): Payer: Medicare Other | Admitting: Internal Medicine

## 2015-12-12 VITALS — BP 109/66 | HR 80 | Temp 97.7°F | Ht 64.0 in | Wt 166.1 lb

## 2015-12-12 DIAGNOSIS — E538 Deficiency of other specified B group vitamins: Secondary | ICD-10-CM | POA: Diagnosis not present

## 2015-12-12 DIAGNOSIS — L409 Psoriasis, unspecified: Secondary | ICD-10-CM

## 2015-12-12 DIAGNOSIS — Z794 Long term (current) use of insulin: Secondary | ICD-10-CM | POA: Diagnosis not present

## 2015-12-12 DIAGNOSIS — IMO0002 Reserved for concepts with insufficient information to code with codable children: Secondary | ICD-10-CM

## 2015-12-12 DIAGNOSIS — F4323 Adjustment disorder with mixed anxiety and depressed mood: Secondary | ICD-10-CM

## 2015-12-12 DIAGNOSIS — E1165 Type 2 diabetes mellitus with hyperglycemia: Secondary | ICD-10-CM

## 2015-12-12 DIAGNOSIS — E1140 Type 2 diabetes mellitus with diabetic neuropathy, unspecified: Secondary | ICD-10-CM | POA: Diagnosis not present

## 2015-12-12 LAB — COMPREHENSIVE METABOLIC PANEL
ALBUMIN: 3.8 g/dL (ref 3.5–5.2)
ALK PHOS: 97 U/L (ref 39–117)
ALT: 23 U/L (ref 0–35)
AST: 39 U/L — AB (ref 0–37)
BILIRUBIN TOTAL: 0.7 mg/dL (ref 0.2–1.2)
BUN: 10 mg/dL (ref 6–23)
CO2: 30 mEq/L (ref 19–32)
CREATININE: 0.83 mg/dL (ref 0.40–1.20)
Calcium: 9.6 mg/dL (ref 8.4–10.5)
Chloride: 102 mEq/L (ref 96–112)
GFR: 71.86 mL/min (ref >60.00)
GLUCOSE: 113 mg/dL — AB (ref 70–99)
POTASSIUM: 5 meq/L (ref 3.5–5.1)
SODIUM: 140 meq/L (ref 135–145)
TOTAL PROTEIN: 7.9 g/dL (ref 6.0–8.3)

## 2015-12-12 LAB — HEMOGLOBIN A1C: Hgb A1c MFr Bld: 7.5 % — ABNORMAL HIGH (ref 4.6–6.5)

## 2015-12-12 MED ORDER — CYANOCOBALAMIN 1000 MCG/ML IJ SOLN
1000.0000 ug | Freq: Once | INTRAMUSCULAR | Status: AC
  Administered 2015-12-12: 1000 ug via INTRAMUSCULAR

## 2015-12-12 MED ORDER — DULOXETINE HCL 30 MG PO CPEP: 30.0000 mg | ORAL_CAPSULE | Freq: Every day | ORAL | Status: DC

## 2015-12-12 NOTE — Patient Instructions (Addendum)
Labs today.  Start Cymbalta 30mg  daily.  Follow up 4 week.

## 2015-12-12 NOTE — Assessment & Plan Note (Signed)
Tearful describing pain associated with psoriasis and diminished quality of life. Offered support today. Will add Cymbalta to help both with pain and depressed mood. Follow up in 4 weeks and prn.

## 2015-12-12 NOTE — Progress Notes (Signed)
Subjective:    Patient ID: Caroline Gray, female    DOB: [birth year], 70 y.o.   MRN: [id number]  HPI  70YO female presents for follow up.  Psoriasis has been worsening. Spreading to legs. Planning to start [place]. Tried light treatment with no improvement. Also tried Cosyntex with no improvement  She is tearful describing how the psoriasis has impacted her life. She is unable to exercise or bathe without severe pain.  DM - BG generally 100-150 fasting and 150-200 post-prandial. Compliant with medication. No low BG<70.    Wt Readings from Last 3 Encounters:  12/12/15 166 lb 2 oz (75.354 kg)  09/01/15 169 lb (76.658 kg)  07/27/15 169 lb (76.658 kg)   BP Readings from Last 3 Encounters:  12/12/15 109/66  09/01/15 116/67  07/27/15 128/76    Past Medical History  Diagnosis Date  . Diabetes mellitus   . Hyperlipidemia   . Psoriasis   . Guillain-Barre ([place])   . Hypertension   . Positive PPD   . Adenomatous colon polyp   . Diverticulosis   . Arthritis   . IBS (irritable bowel syndrome)   . History of kidney stones   . Hx: UTI (urinary tract infection)   . Cataract    Family History  Problem Relation Age of Onset  . Diabetes Mother   . Colon cancer Neg Hx   . Colon polyps Neg Hx   . Rectal cancer Neg Hx   . Stomach cancer Neg Hx   . Other Son     Ulcerative Proctitis   . Heart attack Paternal Aunt   . Heart attack Paternal Uncle   . Heart attack Paternal Uncle   . Heart attack Paternal Uncle   . Heart attack Paternal Uncle   . Heart attack Paternal Aunt   . Heart attack Paternal Aunt   . Heart attack Paternal Aunt    Past Surgical History  Procedure Laterality Date  . Lithotripsy  3.7.13    Dr Jacqlyn Larsen  . Rotator cuff repair  08/2005  . Spine surgery    . Abdominal hysterectomy    . Cholecystectomy    . Appendectomy    . Cataract extraction     Social History   Social History  . Marital Status: Married    Spouse Name: N/A  . Number of Children: N/A  . Years  of Education: N/A   Social History Main Topics  . Smoking status: Former Smoker -- 15 years    Types: Cigarettes    Quit date: 11/19/1999  . Smokeless tobacco: Never Used  . Alcohol Use: Yes     Comment: occassionaly  . Drug Use: No  . Sexual Activity: Not Asked   Other Topics Concern  . None   Social History Narrative    Review of Systems  Constitutional: Negative for fever, chills, appetite change, fatigue and unexpected weight change.  Eyes: Negative for visual disturbance.  Respiratory: Negative for shortness of breath.   Cardiovascular: Negative for chest pain, palpitations and leg swelling.  Gastrointestinal: Negative for abdominal pain.  Skin: Positive for color change and rash.  Neurological: Negative for weakness and numbness.  Hematological: Negative for adenopathy. Does not bruise/bleed easily.  Psychiatric/Behavioral: Positive for sleep disturbance and dysphoric mood. The patient is nervous/anxious.        Objective:    BP 109/66 mmHg  Pulse 80  Temp(Src) 97.7 F (36.5 C) (Oral)  Ht 5\' 4"  (1.626 m)  Wt 166 lb 2 oz (  75.354 kg)  BMI 28.50 kg/m2  SpO2 93% Physical Exam  Constitutional: She is oriented to person, place, and time. She appears well-developed and well-nourished. No distress.  HENT:  Head: Normocephalic and atraumatic.  Right Ear: External ear normal.  Left Ear: External ear normal.  Nose: Nose normal.  Mouth/Throat: Oropharynx is clear and moist. No oropharyngeal exudate.  Eyes: Conjunctivae are normal. Pupils are equal, round, and reactive to light. Right eye exhibits no discharge. Left eye exhibits no discharge. No scleral icterus.  Neck: Normal range of motion. Neck supple. No tracheal deviation present. No thyromegaly present.  Cardiovascular: Normal rate, regular rhythm, normal heart sounds and intact distal pulses.  Exam reveals no gallop and no friction rub.   No murmur heard. Pulmonary/Chest: Effort normal and breath sounds normal. No  respiratory distress. She has no wheezes. She has no rales. She exhibits no tenderness.  Musculoskeletal: Normal range of motion. She exhibits no edema or tenderness.  Lymphadenopathy:    She has no cervical adenopathy.  Neurological: She is alert and oriented to person, place, and time. No cranial nerve deficit. She exhibits normal muscle tone. Coordination normal.  Skin: Skin is warm and dry. Rash noted. Rash is pustular (over palms and soles). She is not diaphoretic. No erythema. No pallor.  Psychiatric: Her speech is normal and behavior is normal. Judgment and thought content normal. She exhibits a depressed mood. She expresses no suicidal ideation.          Assessment & Plan:   Problem List Items Addressed This Visit      Unprioritized   Adjustment disorder with mixed anxiety and depressed mood    Tearful describing pain associated with psoriasis and diminished quality of life. Offered support today. Will add Cymbalta to help both with pain and depressed mood. Follow up in 4 weeks and prn.      Relevant Medications   DULoxetine (CYMBALTA) 30 MG capsule   B12 deficiency   Relevant Medications   cyanocobalamin ((VITAMIN B-12)) injection 1,000 mcg (Completed)   Diabetes mellitus type 2, uncontrolled ([place]) - Primary    BG generally well controlled. A1c with labs today. Continue current medication.      Relevant Orders   Comprehensive metabolic panel   Hemoglobin A1c   Psoriasis    Severe psoriasis which has been resistant to medical treatment. Reviewed dermatology notes. Plans to start [place]. Will follow.          Return in about 3 months (around 03/11/2016) for Recheck.

## 2015-12-12 NOTE — Assessment & Plan Note (Signed)
Severe psoriasis which has been resistant to medical treatment. Reviewed dermatology notes. Plans to start [place]. Will follow.

## 2015-12-12 NOTE — Assessment & Plan Note (Signed)
BG generally well controlled. A1c with labs today. Continue current medication.

## 2015-12-12 NOTE — Progress Notes (Signed)
Pre visit review using our clinic review tool, if applicable. No additional management support is needed unless otherwise documented below in the visit note. 

## 2015-12-19 ENCOUNTER — Ambulatory Visit (INDEPENDENT_AMBULATORY_CARE_PROVIDER_SITE_OTHER): Payer: Medicare Other

## 2015-12-19 VITALS — BP 128/70 | HR 78 | Temp 97.1°F | Resp 14 | Ht 64.0 in | Wt 164.4 lb

## 2015-12-19 DIAGNOSIS — E2839 Other primary ovarian failure: Secondary | ICD-10-CM

## 2015-12-19 DIAGNOSIS — Z0000 Encounter for general adult medical examination without abnormal findings: Secondary | ICD-10-CM | POA: Diagnosis not present

## 2015-12-19 NOTE — Patient Instructions (Addendum)
Caroline Gray,  Thank you for taking time to come for your Medicare Wellness Visit.  I appreciate your ongoing commitment to your health goals. Please review the following plan we discussed and let me know if I can assist you in the future.  Bone Density as directed.   Bone Densitometry Bone densitometry is an imaging test that uses a special X-ray to measure the amount of calcium and other minerals in your bones (bone density). This test is also known as a bone mineral density test or dual-energy X-ray absorptiometry (DXA). The test can measure bone density at your hip and your spine. It is similar to having a regular X-ray. You may have this test to:  Diagnose a condition that causes weak or thin bones (osteoporosis).  Predict your risk of a broken bone (fracture).  Determine how well osteoporosis treatment is working. LET [facility] CARE PROVIDER KNOW ABOUT:  Any allergies you have.  All medicines you are taking, including vitamins, herbs, eye drops, creams, and over-the-counter medicines.  Previous problems you or members of your family have had with the use of anesthetics.  Any blood disorders you have.  Previous surgeries you have had.  Medical conditions you have.  Possibility of pregnancy.  Any other medical test you had within the previous 14 days that used contrast material. RISKS AND COMPLICATIONS Generally, this is a safe procedure. However, problems can occur and may include the following:  This test exposes you to a very small amount of radiation.  The risks of radiation exposure may be greater to unborn children. BEFORE THE PROCEDURE  Do not take any calcium supplements for 24 hours before having the test. You can otherwise eat and drink what you usually do.  Take off all metal jewelry, eyeglasses, dental appliances, and any other metal objects. PROCEDURE  You may lie on an exam table. There will be an X-ray generator below you and an imaging device above  you.  Other devices, such as boxes or braces, may be used to position your body properly for the scan.  You will need to lie still while the machine slowly scans your body.  The images will show up on a computer monitor. AFTER THE PROCEDURE You may need more testing at a later time.   This information is not intended to replace advice given to you by your health care provider. Make sure you discuss any questions you have with your health care provider.   Document Released: 11/26/2004 Document Revised: 11/25/2014 Document Reviewed: 04/14/2014 Elsevier Interactive Patient Education [employer].

## 2015-12-19 NOTE — Progress Notes (Signed)
Annual Wellness Visit as completed by Health Coach was reviewed in full.  

## 2015-12-19 NOTE — Progress Notes (Signed)
Subjective:   Caroline Gray is a 70 y.o. female who presents for Medicare Annual (Subsequent) preventive examination.  Review of Systems:  No ROS.  Medicare Wellness Visit. Cardiac Risk Factors include: diabetes mellitus;advanced age (>35men, >23 women)     Objective:     Vitals: BP 128/70 mmHg  Pulse 78  Temp(Src) 97.1 F (36.2 C) (Oral)  Resp 14  Ht 5\' 4"  (1.626 m)  Wt 164 lb 6.4 oz (74.571 kg)  BMI 28.21 kg/m2  SpO2 95%  Tobacco History  Smoking status  . Former Smoker -- 15 years  . Types: Cigarettes  . Quit date: 11/19/1999  Smokeless tobacco  . Never Used     Counseling given: Not Answered   Past Medical History  Diagnosis Date  . Diabetes mellitus   . Hyperlipidemia   . Psoriasis   . Guillain-Barre ([place])   . Hypertension   . Positive PPD   . Adenomatous colon polyp   . Diverticulosis   . Arthritis   . IBS (irritable bowel syndrome)   . History of kidney stones   . Hx: UTI (urinary tract infection)   . Cataract    Past Surgical History  Procedure Laterality Date  . Lithotripsy  3.7.13    Dr Jacqlyn Larsen  . Rotator cuff repair  08/2005  . Spine surgery    . Abdominal hysterectomy    . Cholecystectomy    . Appendectomy    . Cataract extraction     Family History  Problem Relation Age of Onset  . Diabetes Mother   . Colon cancer Neg Hx   . Colon polyps Neg Hx   . Rectal cancer Neg Hx   . Stomach cancer Neg Hx   . Other Son     Ulcerative Proctitis   . Heart attack Paternal Aunt   . Heart attack Paternal Uncle   . Heart attack Paternal Uncle   . Heart attack Paternal Uncle   . Heart attack Paternal Uncle   . Heart attack Paternal Aunt   . Heart attack Paternal Aunt   . Heart attack Paternal Aunt    History  Sexual Activity  . Sexual Activity: Not Currently    Outpatient Encounter Prescriptions as of 12/19/2015  Medication Sig  . aspirin 81 MG tablet Take 1 tablet (81 mg total) by mouth daily.  . calcipotriene (DOVONOX) 0.005 % cream  Apply topically 2 (two) times daily.  . Calcium Citrate-Vitamin D (CALCIUM CITRATE + D PO) Take 1 capsule by mouth 2 (two) times daily.    . clobetasol ointment (TEMOVATE) [id number] % Apply 1 application topically 2 (two) times daily.  . cyanocobalamin (,VITAMIN B-12,) 1000 MCG/ML injection Inject 1 mL (1,000 mcg total) into the muscle every 30 (thirty) days.  . DULoxetine (CYMBALTA) 30 MG capsule Take 1 capsule (30 mg total) by mouth daily.  Marland Kitchen gabapentin (NEURONTIN) 300 MG capsule TAKE ONE CAPSULE BY MOUTH THREE TIMES DAILY  . glucose blood (ONE TOUCH TEST STRIPS) test strip Use as instructed  . glucose blood test strip 1 each by Other route as needed for other. Test 2 times daily with Freestyle Lite monitor  . Insulin Glargine (LANTUS SOLOSTAR) 100 UNIT/ML Solostar Pen Inject 30 Units into the skin daily at 10 pm.  . Insulin Pen Needle 33G X 8 MM MISC 1 application by Does not apply route daily.  . metFORMIN (GLUCOPHAGE) 1000 MG tablet TAKE ONE TABLET BY MOUTH ONE TIME DAILY  . Multiple Vitamin (MULTIVITAMIN) capsule  Take 1 capsule by mouth daily.    . simvastatin (ZOCOR) 40 MG tablet TAKE ONE TABLET BY MOUTH NIGHTLY AT BEDTIME   No facility-administered encounter medications on file as of 12/19/2015.    Activities of Daily Living In your present state of health, do you have any difficulty performing the following activities: 12/19/2015  Hearing? N  Vision? N  Difficulty concentrating or making decisions? N  Walking or climbing stairs? N  Dressing or bathing? N  Doing errands, shopping? N  Preparing Food and eating ? N  Using the Toilet? N  In the past six months, have you accidently leaked urine? Y  Do you have problems with loss of bowel control? N  Managing your Medications? N  Managing your Finances? N  Housekeeping or managing your Housekeeping? N    Patient Care Team: Jackolyn Confer, MD as PCP - General (Internal Medicine)    Assessment:   This is a routine wellness  examination for Caroline Gray. The goal of the wellness visit is to assist the patient how to close the gaps in care and create a preventative care plan for the patient.   VIT D Calcium as appropriate/ Osteoporosis risk reviewed.  DEXA Scan referral placed.  Medications reviewed; taking without issues or barriers.  Safety issues reviewed; smoke detectors in the home. Firearms locked in a secure area in the home. Wears seatbelts when driving or riding with others. No violence in the home.  No identified risk were noted; The patient was oriented x 3; appropriate in dress and manner and no objective failures at ADL's or IADL's.   Not a candidate for Pneumococcal vaccine; Hx of Guillain-Barre Syndrome.  Simple Foot Exam postponed for follow up with PCP, per patient request.  Hepatitis C Screening postponed, per patient request.  Educational material provided.    TDAP  Vaccine postponed for follow up with insurance.  Patient Concerns:  Extremely fatigued and loss of appetite with latest medication CYMBALTA; she reports no issues with depression, the medication is working well and will continue to take as prescribed and follow up at next scheduled appointment in February.   Exercise Activities and Dietary recommendations Current Exercise Habits:: Exercise is limited by (Psoriasis flare ups), Type of exercise: treadmill;calisthenics, Frequency (Times/Week): 3, Intensity: Mild  Goals    . Increase physical activity     Start swimming with husband for exercise in the mornings at the gym, as tolerated.      Fall Risk Fall Risk  12/19/2015 08/30/2014 10/27/2012  Falls in the past year? No No No   Depression Screen PHQ 2/9 Scores 12/19/2015 08/30/2014 09/28/2012  PHQ - 2 Score 0 0 0     Cognitive Testing MMSE - Mini Mental State Exam 12/19/2015  Orientation to time 5  Orientation to Place 5  Registration 3  Attention/ Calculation 5  Recall 3  Language- name 2 objects 2  Language-  repeat 1  Language- follow 3 step command 3  Language- read & follow direction 1  Write a sentence 1  Copy design 1  Total score 30     There is no immunization history on file for this patient. Screening Tests Health Maintenance  Topic Date Due  . Hepatitis C Screening  17-May-1946  . TETANUS/TDAP  02/21/1963  . DEXA SCAN  02/20/2009  . PNA vac Low Risk Adult (1 of 2 - PCV13) 02/20/2009  . FOOT EXAM  08/31/2015  . INFLUENZA VACCINE  08/30/2017 (Originally 06/19/2015)  . HEMOGLOBIN A1C  06/10/2016  . URINE MICROALBUMIN  07/05/2016  . OPHTHALMOLOGY EXAM  10/08/2016  . MAMMOGRAM  12/21/2016  . COLONOSCOPY  07/29/2017  . ZOSTAVAX  Addressed      Plan:    End of life planning; Advanced aging; Advanced directives discussed. Copy requested of current HCPOA/Living Will.   During the course of the visit the patient was educated and counseled about the following appropriate screening and preventive services:   Vaccines to include Pneumoccal, Influenza, Hepatitis B, Td, Zostavax, HCV  Electrocardiogram  Cardiovascular Disease  Colorectal cancer screening  Bone density screening  Diabetes screening  Glaucoma screening  Mammography/PAP  Nutrition counseling   Patient Instructions (the written plan) was given to the patient.   Varney Biles, LPN  [id number]

## 2016-01-02 ENCOUNTER — Ambulatory Visit
Admission: RE | Admit: 2016-01-02 | Discharge: 2016-01-02 | Disposition: A | Discharge status: Home / Self Care | Payer: Medicare Other | Source: Ambulatory Visit | Attending: Internal Medicine | Admitting: Internal Medicine

## 2016-01-02 DIAGNOSIS — Z13820 Encounter for screening for osteoporosis: Secondary | ICD-10-CM | POA: Diagnosis present

## 2016-01-02 DIAGNOSIS — Z1231 Encounter for screening mammogram for malignant neoplasm of breast: Secondary | ICD-10-CM | POA: Insufficient documentation

## 2016-01-02 DIAGNOSIS — E2839 Other primary ovarian failure: Secondary | ICD-10-CM

## 2016-01-02 DIAGNOSIS — M8580 Other specified disorders of bone density and structure, unspecified site: Secondary | ICD-10-CM | POA: Diagnosis not present

## 2016-01-02 DIAGNOSIS — Z780 Asymptomatic menopausal state: Secondary | ICD-10-CM | POA: Insufficient documentation

## 2016-01-03 ENCOUNTER — Encounter: Payer: Self-pay | Admitting: Internal Medicine

## 2016-01-15 ENCOUNTER — Ambulatory Visit (INDEPENDENT_AMBULATORY_CARE_PROVIDER_SITE_OTHER): Payer: Medicare Other | Admitting: Internal Medicine

## 2016-01-15 ENCOUNTER — Encounter: Payer: Self-pay | Admitting: Internal Medicine

## 2016-01-15 VITALS — BP 160/82 | HR 91 | Temp 97.6°F | Ht 64.0 in | Wt 166.0 lb

## 2016-01-15 DIAGNOSIS — F4323 Adjustment disorder with mixed anxiety and depressed mood: Secondary | ICD-10-CM

## 2016-01-15 DIAGNOSIS — IMO0001 Reserved for inherently not codable concepts without codable children: Secondary | ICD-10-CM

## 2016-01-15 DIAGNOSIS — R030 Elevated blood-pressure reading, without diagnosis of hypertension: Secondary | ICD-10-CM | POA: Diagnosis not present

## 2016-01-15 DIAGNOSIS — L409 Psoriasis, unspecified: Secondary | ICD-10-CM

## 2016-01-15 DIAGNOSIS — E1165 Type 2 diabetes mellitus with hyperglycemia: Secondary | ICD-10-CM

## 2016-01-15 DIAGNOSIS — IMO0002 Reserved for concepts with insufficient information to code with codable children: Secondary | ICD-10-CM

## 2016-01-15 DIAGNOSIS — Z794 Long term (current) use of insulin: Secondary | ICD-10-CM

## 2016-01-15 DIAGNOSIS — E1140 Type 2 diabetes mellitus with diabetic neuropathy, unspecified: Secondary | ICD-10-CM | POA: Diagnosis not present

## 2016-01-15 MED ORDER — DULOXETINE HCL 30 MG PO CPEP: 30.0000 mg | ORAL_CAPSULE | Freq: Every day | ORAL | Status: DC

## 2016-01-15 MED ORDER — INSULIN GLARGINE 100 UNIT/ML SOLOSTAR PEN: 25.0000 [IU] | PEN_INJECTOR | Freq: Every day | SUBCUTANEOUS | Status: DC

## 2016-01-15 MED ORDER — GLUCOSE BLOOD VI STRP: ORAL_STRIP | Status: DC

## 2016-01-15 NOTE — Assessment & Plan Note (Signed)
BG improved with lower dose of Lantus. Will monitor.

## 2016-01-15 NOTE — Progress Notes (Signed)
Subjective:    Patient ID: Caroline Gray, female    DOB: 1946/09/12, 70 y.o.   MRN: [id number]  HPI  69YO female presents for follow up.  Last seen in 11/2015. Added Cymbalta to help with both pain symptoms and depression.  Symptoms improved somewhat, but has been out last 3 days.  Psoriasis - Waiting on approval for Taltz. Currently just using topicals. Continues to have pain in palms and soles.  DM - Cut back to 25units daily on Lantus. BG near 100 mostly fasting.   Wt Readings from Last 3 Encounters:  01/15/16 166 lb (75.297 kg)  12/19/15 164 lb 6.4 oz (74.571 kg)  12/12/15 166 lb 2 oz (75.354 kg)   BP Readings from Last 3 Encounters:  01/15/16 160/82  12/19/15 128/70  12/12/15 109/66    Past Medical History  Diagnosis Date  . Diabetes mellitus   . Hyperlipidemia   . Psoriasis   . Guillain-Barre ([place])   . Hypertension   . Positive PPD   . Adenomatous colon polyp   . Diverticulosis   . Arthritis   . IBS (irritable bowel syndrome)   . History of kidney stones   . Hx: UTI (urinary tract infection)   . Cataract    Family History  Problem Relation Age of Onset  . Diabetes Mother   . Colon cancer Neg Hx   . Colon polyps Neg Hx   . Rectal cancer Neg Hx   . Stomach cancer Neg Hx   . Other Son     Ulcerative Proctitis   . Heart attack Paternal Aunt   . Heart attack Paternal Uncle   . Heart attack Paternal Uncle   . Heart attack Paternal Uncle   . Heart attack Paternal Uncle   . Heart attack Paternal Aunt   . Heart attack Paternal Aunt   . Heart attack Paternal Aunt    Past Surgical History  Procedure Laterality Date  . Lithotripsy  3.7.13    Dr Jacqlyn Larsen  . Rotator cuff repair  08/2005  . Spine surgery    . Abdominal hysterectomy    . Cholecystectomy    . Appendectomy    . Cataract extraction     Social History   Social History  . Marital Status: Married    Spouse Name: N/A  . Number of Children: N/A  . Years of Education: N/A   Social History Main  Topics  . Smoking status: Former Smoker -- 15 years    Types: Cigarettes    Quit date: 11/19/1999  . Smokeless tobacco: Never Used  . Alcohol Use: Yes     Comment: occassionaly  . Drug Use: No  . Sexual Activity: Not Currently   Other Topics Concern  . None   Social History Narrative    Review of Systems  Constitutional: Negative for fever, chills, appetite change, fatigue and unexpected weight change.  Eyes: Negative for visual disturbance.  Respiratory: Negative for shortness of breath.   Cardiovascular: Negative for chest pain and leg swelling.  Gastrointestinal: Negative for abdominal pain, diarrhea and constipation.  Musculoskeletal: Positive for myalgias.  Skin: Positive for rash. Negative for color change.  Hematological: Negative for adenopathy. Does not bruise/bleed easily.  Psychiatric/Behavioral: Positive for sleep disturbance. Negative for dysphoric mood. The patient is not nervous/anxious.        Objective:    BP 160/82 mmHg  Pulse 91  Temp(Src) 97.6 F (36.4 C) (Oral)  Ht 5\' 4"  (1.626 m)  Wt 166  lb (75.297 kg)  BMI 28.48 kg/m2  SpO2 97% Physical Exam  Constitutional: She is oriented to person, place, and time. She appears well-developed and well-nourished. No distress.  HENT:  Head: Normocephalic and atraumatic.  Right Ear: External ear normal.  Left Ear: External ear normal.  Nose: Nose normal.  Mouth/Throat: Oropharynx is clear and moist. No oropharyngeal exudate.  Eyes: Conjunctivae are normal. Pupils are equal, round, and reactive to light. Right eye exhibits no discharge. Left eye exhibits no discharge. No scleral icterus.  Neck: Normal range of motion. Neck supple. No tracheal deviation present. No thyromegaly present.  Cardiovascular: Normal rate, regular rhythm, normal heart sounds and intact distal pulses.  Exam reveals no gallop and no friction rub.   No murmur heard. Pulmonary/Chest: Effort normal and breath sounds normal. No respiratory  distress. She has no wheezes. She has no rales. She exhibits no tenderness.  Musculoskeletal: Normal range of motion. She exhibits no edema or tenderness.  Lymphadenopathy:    She has no cervical adenopathy.  Neurological: She is alert and oriented to person, place, and time. No cranial nerve deficit. She exhibits normal muscle tone. Coordination normal.  Skin: Skin is warm and dry. Rash noted. Rash is pustular (over palms). She is not diaphoretic. No erythema. No pallor.  Psychiatric: She has a normal mood and affect. Her speech is normal and behavior is normal. Judgment and thought content normal.          Assessment & Plan:   Problem List Items Addressed This Visit      Unprioritized   Adjustment disorder with mixed anxiety and depressed mood - Primary    Symptoms improved with Cymbalta. Will continue.      Relevant Medications   DULoxetine (CYMBALTA) 30 MG capsule   Diabetes mellitus type 2, uncontrolled ([facility])    BG improved with lower dose of Lantus. Will monitor.      Relevant Medications   Insulin Glargine (LANTUS SOLOSTAR) 100 UNIT/ML Solostar Pen   Elevated BP    BP elevated which is unusual for her. Will monitor at home and she will email with readings this week.      Psoriasis    Followed by dermatology. Severe, refractory disease.Waiting on approval from insurance for [place]. Will follow.          Return in about 3 months (around 04/13/2016) for Recheck of Diabetes.  Ronette Deter, MD Internal Medicine [facility]

## 2016-01-15 NOTE — Progress Notes (Signed)
Pre visit review using our clinic review tool, if applicable. No additional management support is needed unless otherwise documented below in the visit note. 

## 2016-01-15 NOTE — Assessment & Plan Note (Signed)
Followed by dermatology. Severe, refractory disease.Waiting on approval from insurance for [place]. Will follow.

## 2016-01-15 NOTE — Assessment & Plan Note (Signed)
Symptoms improved with Cymbalta. Will continue.

## 2016-01-15 NOTE — Assessment & Plan Note (Signed)
BP elevated which is unusual for her. Will monitor at home and she will email with readings this week.

## 2016-01-15 NOTE — Patient Instructions (Signed)
Continue current medication including Cymbalta 30mg  daily.

## 2016-01-22 ENCOUNTER — Encounter: Payer: Self-pay | Admitting: Internal Medicine

## 2016-02-14 ENCOUNTER — Other Ambulatory Visit: Payer: Self-pay | Admitting: Internal Medicine

## 2016-02-15 ENCOUNTER — Ambulatory Visit (INDEPENDENT_AMBULATORY_CARE_PROVIDER_SITE_OTHER): Payer: Medicare Other

## 2016-02-15 DIAGNOSIS — E538 Deficiency of other specified B group vitamins: Secondary | ICD-10-CM | POA: Diagnosis not present

## 2016-02-15 MED ORDER — CYANOCOBALAMIN 1000 MCG/ML IJ SOLN
1000.0000 ug | Freq: Once | INTRAMUSCULAR | Status: AC
  Administered 2016-02-15: 1000 ug via INTRAMUSCULAR

## 2016-02-15 NOTE — Progress Notes (Signed)
Patient came in for a b12 injection.  Received in Right deltoid.  Patient tolerated well.  

## 2016-03-19 ENCOUNTER — Ambulatory Visit (INDEPENDENT_AMBULATORY_CARE_PROVIDER_SITE_OTHER): Payer: Medicare Other | Admitting: *Deleted

## 2016-03-19 DIAGNOSIS — E538 Deficiency of other specified B group vitamins: Secondary | ICD-10-CM

## 2016-03-19 MED ORDER — CYANOCOBALAMIN 1000 MCG/ML IJ SOLN
1000.0000 ug | Freq: Once | INTRAMUSCULAR | Status: AC
  Administered 2016-03-19: 1000 ug via INTRAMUSCULAR

## 2016-03-19 NOTE — Progress Notes (Signed)
Patient presented for B12 injection to left deltoid and tolerated well. 

## 2016-04-16 ENCOUNTER — Encounter: Payer: Self-pay | Admitting: Internal Medicine

## 2016-04-16 ENCOUNTER — Ambulatory Visit (INDEPENDENT_AMBULATORY_CARE_PROVIDER_SITE_OTHER): Payer: Medicare Other | Admitting: Internal Medicine

## 2016-04-16 VITALS — BP 138/78 | HR 95 | Ht 64.0 in | Wt 168.8 lb

## 2016-04-16 DIAGNOSIS — E1165 Type 2 diabetes mellitus with hyperglycemia: Secondary | ICD-10-CM | POA: Diagnosis not present

## 2016-04-16 DIAGNOSIS — E1140 Type 2 diabetes mellitus with diabetic neuropathy, unspecified: Secondary | ICD-10-CM | POA: Diagnosis not present

## 2016-04-16 DIAGNOSIS — L409 Psoriasis, unspecified: Secondary | ICD-10-CM

## 2016-04-16 DIAGNOSIS — Z1159 Encounter for screening for other viral diseases: Secondary | ICD-10-CM | POA: Diagnosis not present

## 2016-04-16 DIAGNOSIS — Z794 Long term (current) use of insulin: Secondary | ICD-10-CM

## 2016-04-16 DIAGNOSIS — IMO0002 Reserved for concepts with insufficient information to code with codable children: Secondary | ICD-10-CM

## 2016-04-16 DIAGNOSIS — R51 Headache: Secondary | ICD-10-CM

## 2016-04-16 DIAGNOSIS — R519 Headache, unspecified: Secondary | ICD-10-CM | POA: Insufficient documentation

## 2016-04-16 LAB — COMPREHENSIVE METABOLIC PANEL
ALK PHOS: 93 U/L (ref 39–117)
ALT: 19 U/L (ref 0–35)
AST: 28 U/L (ref 0–37)
Albumin: 3.8 g/dL (ref 3.5–5.2)
BILIRUBIN TOTAL: 0.5 mg/dL (ref 0.2–1.2)
BUN: 13 mg/dL (ref 6–23)
CO2: 30 meq/L (ref 19–32)
Calcium: 9.8 mg/dL (ref 8.4–10.5)
Chloride: 104 mEq/L (ref 96–112)
Creatinine, Ser: 0.72 mg/dL (ref 0.40–1.20)
GFR: 84.59 mL/min (ref >60.00)
GLUCOSE: 187 mg/dL — AB (ref 70–99)
POTASSIUM: 4.4 meq/L (ref 3.5–5.1)
SODIUM: 140 meq/L (ref 135–145)
TOTAL PROTEIN: 7.2 g/dL (ref 6.0–8.3)

## 2016-04-16 LAB — SEDIMENTATION RATE: Sed Rate: 46 mm/hr — ABNORMAL HIGH (ref 0–30)

## 2016-04-16 LAB — HEMOGLOBIN A1C: Hgb A1c MFr Bld: 7.8 % — ABNORMAL HIGH (ref 4.6–6.5)

## 2016-04-16 NOTE — Assessment & Plan Note (Signed)
Recent right posterior headache pain. Exam is normal. Suspect symptoms may be related to previous herpes zoster in this area. Will also screen for GCA with ESR today. If persistent or worsening pain, consider imaging.

## 2016-04-16 NOTE — Assessment & Plan Note (Signed)
BG variable, likely affected by other medications including new medication, Taltz. Will check A1c with labs. Will monitor closely. She will email with updates and tract BG.

## 2016-04-16 NOTE — Progress Notes (Signed)
Subjective:    Patient ID: Caroline Gray, female    DOB: 1945-11-29, 70 y.o.   MRN: [id number]  HPI  70YO female presents for follow up.  DM - BG have been up and down. Went back up to Lantus 30units daily. Some BG in 80s-110s. Trying to tract blood sugars with Taltz dosing.  Psoriasis - Continues on loading doses of Taltz. Tolerating well. Peeling over hands improved.  Over the last month, has occasional sharp pains behind ear. Lasts for a few minutes then resolves without intervention. No trauma to area. No scalp lesions or tenderness. Not taking anything for pain. No congestion, ear pain or other URI symptoms. Of note, had shingles in right face in the past.  Wt Readings from Last 3 Encounters:  04/16/16 168 lb 12.8 oz (76.567 kg)  01/15/16 166 lb (75.297 kg)  12/19/15 164 lb 6.4 oz (74.571 kg)   BP Readings from Last 3 Encounters:  04/16/16 138/78  01/15/16 160/82  12/19/15 128/70    Past Medical History  Diagnosis Date  . Diabetes mellitus   . Hyperlipidemia   . Psoriasis   . Guillain-Barre ([place])   . Hypertension   . Positive PPD   . Adenomatous colon polyp   . Diverticulosis   . Arthritis   . IBS (irritable bowel syndrome)   . History of kidney stones   . Hx: UTI (urinary tract infection)   . Cataract    Family History  Problem Relation Age of Onset  . Diabetes Mother   . Colon cancer Neg Hx   . Colon polyps Neg Hx   . Rectal cancer Neg Hx   . Stomach cancer Neg Hx   . Other Son     Ulcerative Proctitis   . Heart attack Paternal Aunt   . Heart attack Paternal Uncle   . Heart attack Paternal Uncle   . Heart attack Paternal Uncle   . Heart attack Paternal Uncle   . Heart attack Paternal Aunt   . Heart attack Paternal Aunt   . Heart attack Paternal Aunt    Past Surgical History  Procedure Laterality Date  . Lithotripsy  3.7.13    Dr Jacqlyn Larsen  . Rotator cuff repair  08/2005  . Spine surgery    . Abdominal hysterectomy    . Cholecystectomy    .  Appendectomy    . Cataract extraction     Social History   Social History  . Marital Status: Married    Spouse Name: N/A  . Number of Children: N/A  . Years of Education: N/A   Social History Main Topics  . Smoking status: Former Smoker -- 15 years    Types: Cigarettes    Quit date: 11/19/1999  . Smokeless tobacco: Never Used  . Alcohol Use: Yes     Comment: occassionaly  . Drug Use: No  . Sexual Activity: Not Currently   Other Topics Concern  . None   Social History Narrative    Review of Systems  Constitutional: Negative for fever, chills, appetite change, fatigue and unexpected weight change.  Eyes: Negative for visual disturbance.  Respiratory: Negative for shortness of breath.   Cardiovascular: Negative for chest pain and leg swelling.  Gastrointestinal: Negative for nausea, vomiting, abdominal pain, diarrhea and constipation.  Musculoskeletal: Negative for myalgias and arthralgias.  Skin: Positive for rash. Negative for color change.  Neurological: Positive for headaches. Negative for dizziness, tremors, seizures, syncope, facial asymmetry, weakness, light-headedness and numbness.  Hematological: Negative  for adenopathy. Does not bruise/bleed easily.  Psychiatric/Behavioral: Negative for suicidal ideas, sleep disturbance and dysphoric mood. The patient is not nervous/anxious.        Objective:    BP 138/78 mmHg  Pulse 95  Ht _0  (1.626 m)  Wt 168 lb 12.8 oz (76.567 kg)  BMI 28.96 kg/m2  SpO2 96% Physical Exam  Constitutional: She is oriented to person, place, and time. She appears well-developed and well-nourished. No distress.  HENT:  Head: Normocephalic and atraumatic.  Right Ear: External ear normal.  Left Ear: External ear normal.  Nose: Nose normal.  Mouth/Throat: Oropharynx is clear and moist. No oropharyngeal exudate.  Eyes: Conjunctivae are normal. Pupils are equal, round, and reactive to light. Right eye exhibits no discharge. Left eye  exhibits no discharge. No scleral icterus.  Neck: Normal range of motion. Neck supple. No tracheal deviation present. No thyromegaly present.  Cardiovascular: Normal rate, regular rhythm, normal heart sounds and intact distal pulses.  Exam reveals no gallop and no friction rub.   No murmur heard. Pulmonary/Chest: Effort normal and breath sounds normal. No respiratory distress. She has no wheezes. She has no rales. She exhibits no tenderness.  Musculoskeletal: Normal range of motion. She exhibits no edema or tenderness.  Lymphadenopathy:    She has no cervical adenopathy.  Neurological: She is alert and oriented to person, place, and time. No cranial nerve deficit. She exhibits normal muscle tone. Coordination normal.  Skin: Skin is warm and dry. Rash (peeling over palms and soles) noted. She is not diaphoretic. No erythema. No pallor.  Psychiatric: She has a normal mood and affect. Her behavior is normal. Judgment and thought content normal.          Assessment & Plan:   Problem List Items Addressed This Visit      Unprioritized   Cephalalgia    Recent right posterior headache pain. Exam is normal. Suspect symptoms may be related to previous herpes zoster in this area. Will also screen for GCA with ESR today. If persistent or worsening pain, consider imaging.      Relevant Orders   Sed Rate (ESR)   Diabetes mellitus type 2, uncontrolled ([place]) - Primary    BG variable, likely affected by other medications including new medication, Taltz. Will check A1c with labs. Will monitor closely. She will email with updates and tract BG.      Relevant Orders   Comprehensive metabolic panel   Hemoglobin A1c   Need for hepatitis C screening test    Will check Hep C antibody with labs today.      Relevant Orders   Hepatitis C antibody   Psoriasis    Psoriasis improved somewhat with addition of Taltz. Will continue to monitor. Continue follow up with dermatology at [facility].           Return in about 3 months (around 07/17/2016) for Recheck of Diabetes.  Ronette Deter, MD Internal Medicine [facility]

## 2016-04-16 NOTE — Patient Instructions (Signed)
Labs today.  Follow up in 3 months and sooner as needed. 

## 2016-04-16 NOTE — Assessment & Plan Note (Signed)
Will check Hep C antibody with labs today.

## 2016-04-16 NOTE — Assessment & Plan Note (Signed)
Psoriasis improved somewhat with addition of Taltz. Will continue to monitor. Continue follow up with dermatology at [facility].

## 2016-04-17 LAB — HEPATITIS C ANTIBODY: HCV Ab: NEGATIVE

## 2016-04-18 ENCOUNTER — Ambulatory Visit: Payer: Medicare Other

## 2016-05-07 ENCOUNTER — Ambulatory Visit (INDEPENDENT_AMBULATORY_CARE_PROVIDER_SITE_OTHER): Payer: Medicare Other | Admitting: *Deleted

## 2016-05-07 DIAGNOSIS — E538 Deficiency of other specified B group vitamins: Secondary | ICD-10-CM | POA: Diagnosis not present

## 2016-05-07 MED ORDER — CYANOCOBALAMIN 1000 MCG/ML IJ SOLN
1000.0000 ug | Freq: Once | INTRAMUSCULAR | Status: AC
  Administered 2016-05-07: 1000 ug via INTRAMUSCULAR

## 2016-05-07 NOTE — Progress Notes (Signed)
Patient presented for B12 injection to right deltoid and tolerated well.

## 2016-05-27 ENCOUNTER — Telehealth: Payer: Self-pay | Admitting: Women's Health

## 2016-05-27 NOTE — Telephone Encounter (Signed)
S/w pt who reports BP 136/82, 140/76 this morning and 156/83 at 11:30pm last evening. Left upper chest pain accompanied elevated BP last evening. She took one aspirin. Pain resolved on its own. Denies any other sx or any chest pain at this time. She is concerned of BP. She has not been feeling well for past few days with nausea. BP at July 5 [facility] urologoy appt was 158/77. She is scheduled to see Dr. Fletcher Anon 8/24 and requests earlier appt. Appt changed to 7/13. Advised pt to continue to monitor s/s and keep BP log and bring to appt. Reviewed s/s that would need immediate attention in ER setting. Pt verbalized understanding, is agreeable w/plan w/no further questions.

## 2016-05-27 NOTE — Telephone Encounter (Signed)
Pt c/o of Chest Pain: STAT if CP now or developed within 24 hours  1. Are you having CP right now? no  2. Are you experiencing any other symptoms (ex. SOB, nausea, vomiting, sweating)? Elevated BP. This a.m. 140/76, last night 156/83, had CP with this reading, pt did take an aspirin at this time  3. How long have you been experiencing CP? 2 weeks, last night was more consistent and uncomfortable  4. Is your CP continuous or coming and going? Continual for 15 min and slack and then start up again  5. Have you taken Nitroglycerin? no?

## 2016-05-30 ENCOUNTER — Other Ambulatory Visit
Admission: RE | Admit: 2016-05-30 | Discharge: 2016-05-30 | Disposition: A | Discharge status: Home / Self Care | Payer: Medicare Other | Source: Ambulatory Visit | Attending: Cardiovascular Disease | Admitting: Cardiovascular Disease

## 2016-05-30 ENCOUNTER — Ambulatory Visit (INDEPENDENT_AMBULATORY_CARE_PROVIDER_SITE_OTHER): Payer: Medicare Other | Admitting: Cardiovascular Disease

## 2016-05-30 ENCOUNTER — Encounter: Payer: Self-pay | Admitting: Cardiovascular Disease

## 2016-05-30 ENCOUNTER — Other Ambulatory Visit: Payer: Self-pay | Admitting: Internal Medicine

## 2016-05-30 VITALS — BP 153/81 | HR 95 | Ht 63.0 in | Wt 170.8 lb

## 2016-05-30 DIAGNOSIS — R079 Chest pain, unspecified: Secondary | ICD-10-CM | POA: Diagnosis present

## 2016-05-30 LAB — TROPONIN I

## 2016-05-30 MED ORDER — LOSARTAN POTASSIUM 25 MG PO TABS: 25.0000 mg | ORAL_TABLET | Freq: Every day | ORAL | Status: DC

## 2016-05-30 NOTE — Progress Notes (Signed)
Cardiology Office Note   Date:  05/30/2016   ID:  Caroline Gray, DOB 01-25-1946, MRN [id number]  PCP:  Rica Mast, MD  Cardiologist:   Kathlyn Sacramento, MD   Chief Complaint  Patient presents with  . other    C/o chest pain. Meds reviewed verbally with pt.      History of Present Illness: Caroline Gray is a 70 y.o. female who presents for urgent evaluation regarding chest pain. She is well-known to me with history of coronary atherosclerosis noted on CT scan.  She had a normal nuclear stress test in 2015. She has chronic medical conditions that include type 2 diabetes which was diagnosed in the early 90s, hypertension, hyperlipidemia and severe psoriasis. She also has family history of coronary artery disease.  She had sudden onset of chest pain on Tuesday evening which lasted until she went to bed.she woke up next morning of the chest pain was resolved. The pain was described as substernal tightness and cramping in the center and left side of chest with no radiation. It was associated with shortness of breath and an anxiety feeling. She had no sweating or nausea. No previous similar episodes. There was no heartburn or abdominal pain. She hasn't had any recurrent symptoms since then although she does complain of being tired and fatigue. She stopped going to her exercise program because of this.   Past Medical History  Diagnosis Date  . Diabetes mellitus   . Hyperlipidemia   . Psoriasis   . 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 Hypertension   . Positive PPD   . Adenomatous colon polyp   . Diverticulosis   . Arthritis   . IBS (irritable bowel syndrome)   . History of kidney stones   . Hx: UTI (urinary tract infection)   . Cataract     Past Surgical History  Procedure Laterality Date  . Lithotripsy  3.7.13    Dr Jacqlyn Larsen  . Rotator cuff repair  08/2005  . Spine surgery    . Abdominal hysterectomy    . Cholecystectomy    . Appendectomy    . Cataract extraction       Current  Outpatient Prescriptions  Medication Sig Dispense Refill  . aspirin 81 MG tablet Take 1 tablet (81 mg total) by mouth daily. 30 tablet   . calcipotriene (DOVONOX) 0.005 % cream Apply topically 2 (two) times daily.    . Calcium Citrate-Vitamin D (CALCIUM CITRATE + D PO) Take 1 capsule by mouth 2 (two) times daily.      . clobetasol ointment (TEMOVATE) [id number] % Apply 1 application topically 2 (two) times daily.    . cyanocobalamin (,VITAMIN B-12,) 1000 MCG/ML injection Inject 1 mL (1,000 mcg total) into the muscle every 30 (thirty) days. 30 mL 0  . DULoxetine (CYMBALTA) 30 MG capsule Take 1 capsule (30 mg total) by mouth daily. 30 capsule 3  . gabapentin (NEURONTIN) 300 MG capsule TAKE ONE CAPSULE BY MOUTH THREE TIMES DAILY 90 capsule 9  . glucose blood test strip Test 2 times daily with Accu-Chek monitor 100 each 3  . Insulin Glargine (LANTUS SOLOSTAR) 100 UNIT/ML Solostar Pen Inject 25 Units into the skin daily at 10 pm. 15 mL 11  . Insulin Pen Needle 33G X 8 MM MISC 1 application by Does not apply route daily. 100 each 11  . Ixekizumab (TALTZ [place]) Inject into the skin. Pt uses every two weeks.    . metFORMIN (GLUCOPHAGE) 1000 MG tablet TAKE  1 TABLET BY MOUTH ONCE A DAY 90 tablet 3  . simvastatin (ZOCOR) 40 MG tablet TAKE 1 TABLET BY MOUTH EVERY NIGHT AT BEDTIME 90 tablet 3   No current facility-administered medications for this visit.    Allergies:   Codeine; Influenza vaccines; Ioxaglate; Ivp dye; Macrodantin; and Nitrofurantoin    Social History:  The patient  reports that she quit smoking about 16 years ago. Her smoking use included Cigarettes. She quit after 15 years of use. She has never used smokeless tobacco. She reports that she drinks alcohol. She reports that she does not use illicit drugs.   Family History:  The patient's family history includes Diabetes in her mother; Heart attack in her paternal aunt, paternal aunt, paternal aunt, paternal aunt, paternal uncle, paternal uncle,  paternal uncle, and paternal uncle; Other in her son. There is no history of Colon cancer, Colon polyps, Rectal cancer, or Stomach cancer.    ROS:  Please see the history of present illness.   Otherwise, review of systems are positive for none.   All other systems are reviewed and negative.    PHYSICAL EXAM: VS:  BP 153/81 mmHg  Pulse 95  Ht 5\' 3"  (1.6 m)  Wt 170 lb 12 oz (77.452 kg)  BMI 30.25 kg/m2 , BMI Body mass index is 30.25 kg/(m^2). GEN: Well nourished, well developed, in no acute distress HEENT: normal Neck: no JVD, carotid bruits, or masses Cardiac: RRR; no murmurs, rubs, or gallops,no edema  Respiratory:  clear to auscultation bilaterally, normal work of breathing GI: soft, nontender, nondistended, + BS MS: no deformity or atrophy Skin: warm and dry, no rash Neuro:  Strength and sensation are intact Psych: euthymic mood, full affect   EKG:  EKG is ordered today. The ekg ordered today demonstrates normal sinus rhythm with no significant ST or T wave changes   Recent Labs: 04/16/2016: ALT 19; BUN 13; Creatinine, Ser 0.72; Potassium 4.4; Sodium 140    Lipid Panel    Component Value Date/Time   CHOL 137 07/06/2015 0957   TRIG 142.0 07/06/2015 0957   HDL 33.30* 07/06/2015 0957   CHOLHDL 4 07/06/2015 0957   VLDL 28.4 07/06/2015 0957   LDLCALC 75 07/06/2015 0957   LDLDIRECT 74.7 11/05/2013 1034      Wt Readings from Last 3 Encounters:  05/30/16 170 lb 12 oz (77.452 kg)  04/16/16 168 lb 12.8 oz (76.567 kg)  01/15/16 166 lb (75.297 kg)       ASSESSMENT AND PLAN:  1.  Ischemic type chest pain: The patient's episode of prolonged chest pain on Tuesday is concerning for an ischemic cardiac event. Her EKG today is reassuring and also she hasn't had recurrent symptoms since Tuesday. I'm going to send her to have a stat troponin. If the episode was due to myocardial infarction, the troponin should be still elevated. She seems to be very anxious. If troponin is  negative, the plan is to proceed with a treadmill nuclear stress test tomorrow.continue low-dose aspirin and simvastatin.  2. Essential hypertension: Blood pressure is mildly elevated. Given that she is diabetic, I elected to add small dose losartan 25 mg once daily.  3. Hyperlipidemia: Continue treatment with simvastatin. Most recent LDL was 75.      Disposition:   FU with me in 2 weeks  Signed,  Kathlyn Sacramento, MD  05/30/2016 11:32 AM    [place]

## 2016-05-30 NOTE — Patient Instructions (Addendum)
Medication Instructions:  Your physician has recommended you make the following change in your medication:  START taking losartan 25mg  once daily    Labwork: Troponin at [facility]   Testing/Procedures: Your physician has requested that you have a lexiscan myoview. For further information please visit [email or web address]. Please follow instruction sheet, as given.  [place]  Your caregiver has ordered a Stress Test with nuclear imaging. The purpose of this test is to evaluate the blood supply to your heart muscle. This procedure is referred to as a "Non-Invasive Stress Test." This is because other than having an IV started in your vein, nothing is inserted or "invades" your body. Cardiac stress tests are done to find areas of poor blood flow to the heart by determining the extent of coronary artery disease (CAD). Some patients exercise on a treadmill, which naturally increases the blood flow to your heart, while others who are  unable to walk on a treadmill due to physical limitations have a pharmacologic/chemical stress agent called Lexiscan . This medicine will mimic walking on a treadmill by temporarily increasing your coronary blood flow.   Please note: these test may take anywhere between 2-4 hours to complete  PLEASE REPORT TO [place] AT THE FIRST DESK WILL DIRECT YOU WHERE TO GO  Date of Procedure:__Friday, July 14 Arrival Time for Procedure: 7:45am  Instructions regarding medication:   _xx__ : Hold diabetes medication morning of procedure. Take 1/2 dose of evening insulin tonight    PLEASE NOTIFY THE OFFICE AT LEAST 24 HOURS IN ADVANCE IF YOU ARE UNABLE TO KEEP YOUR APPOINTMENT.  [phone] AND  PLEASE NOTIFY NUCLEAR MEDICINE AT [facility] AT LEAST 24 HOURS IN ADVANCE IF YOU ARE UNABLE TO KEEP YOUR APPOINTMENT. [phone]  How to prepare for your Myoview test:   Do not eat or drink after midnight  No caffeine for 24 hours prior to  test  No smoking 24 hours prior to test.  Your medication may be taken with water.  If your doctor stopped a medication because of this test, do not take that medication.  Ladies, please do not wear dresses.  Skirts or pants are appropriate. Please wear a short sleeve shirt.  No perfume, cologne or lotion.  Wear comfortable walking shoes. No heels!            Follow-Up: Your physician recommends that you schedule a follow-up appointment in: 2 weeks with Dr. Fletcher Anon.    Any Other Special Instructions Will Be Listed Below (If Applicable).     If you need a refill on your cardiac medications before your next appointment, please call your pharmacy.  Cardiac Nuclear Scanning A cardiac nuclear scan is used to check your heart for problems, such as the following:  A portion of the heart is not getting enough blood.  Part of the heart muscle has died, which happens with a heart attack.  The heart wall is not working normally.  In this test, a radioactive dye (tracer) is injected into your bloodstream. After the tracer has traveled to your heart, a scanning device is used to measure how much of the tracer is absorbed by or distributed to various areas of your heart. LET [facility] CARE PROVIDER KNOW ABOUT:  Any allergies you have.  All medicines you are taking, including vitamins, herbs, eye drops, creams, and over-the-counter medicines.  Previous problems you or members of your family have had with the use of anesthetics.  Any blood disorders you have.  Previous surgeries you have had.  Medical conditions you have.  RISKS AND COMPLICATIONS Generally, this is a safe procedure. However, as with any procedure, problems can occur. Possible problems include:   Serious chest pain.  Rapid heartbeat.  Sensation of warmth in your chest. This usually passes quickly. BEFORE THE PROCEDURE Ask your health care provider about changing or stopping your regular  medicines. PROCEDURE This procedure is usually done at a hospital and takes 2-4 hours.  An IV tube is inserted into one of your veins.  Your health care provider will inject a small amount of radioactive tracer through the tube.  You will then wait for 20-40 minutes while the tracer travels through your bloodstream.  You will lie down on an exam table so images of your heart can be taken. Images will be taken for about 15-20 minutes.  You will exercise on a treadmill or stationary bike. While you exercise, your heart activity will be monitored with an electrocardiogram (ECG), and your blood pressure will be checked.  If you are unable to exercise, you may be given a medicine to make your heart beat faster.  When blood flow to your heart has peaked, tracer will again be injected through the IV tube.  After 20-40 minutes, you will get back on the exam table and have more images taken of your heart.  When the procedure is over, your IV tube will be removed. AFTER THE PROCEDURE  You will likely be able to leave shortly after the test. Unless your health care provider tells you otherwise, you may return to your normal schedule, including diet, activities, and medicines.  Make sure you find out how and when you will get your test results.   This information is not intended to replace advice given to you by your health care provider. Make sure you discuss any questions you have with your health care provider.   Document Released: 11/29/2004 Document Revised: 11/09/2013 Document Reviewed: 10/13/2013 Elsevier Interactive Patient Education [employer].

## 2016-05-31 ENCOUNTER — Encounter
Admission: RE | Admit: 2016-05-31 | Discharge: 2016-05-31 | Disposition: A | Discharge status: Home / Self Care | Payer: Medicare Other | Source: Ambulatory Visit | Attending: Cardiovascular Disease | Admitting: Cardiovascular Disease

## 2016-05-31 DIAGNOSIS — R079 Chest pain, unspecified: Secondary | ICD-10-CM | POA: Diagnosis not present

## 2016-05-31 LAB — NM MYOCAR MULTI W/SPECT W/WALL MOTION / EF
CHL CUP RESTING HR STRESS: 93 {beats}/min
CHL CUP STRESS STAGE 1 HR: 93 {beats}/min
CHL CUP STRESS STAGE 2 SPEED: 0.1 mph
CHL CUP STRESS STAGE 4 HR: 130 {beats}/min
CHL CUP STRESS STAGE 4 SPEED: 1.7 mph
CHL CUP STRESS STAGE 5 GRADE: 0 %
CHL CUP STRESS STAGE 5 HR: 116 {beats}/min
CHL CUP STRESS STAGE 6 DBP: 61 mmHg
CHL CUP STRESS STAGE 6 SBP: 141 mmHg
CSEPED: 3 min
CSEPPBP: 106 mmHg
CSEPPHR: 130 {beats}/min
Estimated workload: 4.6 METS
Exercise duration (sec): 2 s
LV dias vol: 33 mL (ref 46–106)
LVSYSVOL: 11 mL
Percent HR: 88 %
Percent of predicted max HR: 87 %
SDS: 1
SRS: 19
SSS: 16
Stage 2 Grade: 0 %
Stage 2 HR: 93 {beats}/min
Stage 3 Grade: 0 %
Stage 3 HR: 93 {beats}/min
Stage 3 Speed: 0.1 mph
Stage 4 DBP: 51 mmHg
Stage 4 Grade: 10 %
Stage 4 SBP: 106 mmHg
Stage 5 Speed: 0 mph
Stage 6 Grade: 0 %
Stage 6 HR: 97 {beats}/min
Stage 6 Speed: 0 mph
TID: 0.94

## 2016-05-31 MED ORDER — TECHNETIUM TC 99M TETROFOSMIN IV KIT
30.0000 | PACK | Freq: Once | INTRAVENOUS | Status: AC | PRN
  Administered 2016-05-31: 30.902 via INTRAVENOUS

## 2016-05-31 MED ORDER — TECHNETIUM TC 99M TETROFOSMIN IV KIT
13.0000 | PACK | Freq: Once | INTRAVENOUS | Status: AC | PRN
  Administered 2016-05-31: 13.464 via INTRAVENOUS

## 2016-06-01 ENCOUNTER — Telehealth: Payer: Self-pay

## 2016-06-01 NOTE — Telephone Encounter (Signed)
Patient is on the list for [employer] 2017 and may be a good candidate for an AWV in 2017. Please let me know if/when appt is scheduled.   

## 2016-06-03 NOTE — Telephone Encounter (Signed)
AWV was completed on 12/19/15.  Not due until 2018.

## 2016-06-11 ENCOUNTER — Ambulatory Visit (INDEPENDENT_AMBULATORY_CARE_PROVIDER_SITE_OTHER): Payer: Medicare Other

## 2016-06-11 DIAGNOSIS — E538 Deficiency of other specified B group vitamins: Secondary | ICD-10-CM | POA: Diagnosis not present

## 2016-06-11 MED ORDER — CYANOCOBALAMIN 1000 MCG/ML IJ SOLN
1000.0000 ug | Freq: Once | INTRAMUSCULAR | Status: AC
  Administered 2016-06-11: 1000 ug via INTRAMUSCULAR

## 2016-06-11 NOTE — Progress Notes (Signed)
Patient was in today receiving a B12 injection in the left deltoid. patient tolerated well. 

## 2016-06-13 ENCOUNTER — Ambulatory Visit (INDEPENDENT_AMBULATORY_CARE_PROVIDER_SITE_OTHER): Payer: Medicare Other | Admitting: Cardiovascular Disease

## 2016-06-13 ENCOUNTER — Encounter: Payer: Self-pay | Admitting: Cardiovascular Disease

## 2016-06-13 VITALS — BP 100/58 | HR 101 | Ht 63.0 in | Wt 170.0 lb

## 2016-06-13 DIAGNOSIS — E785 Hyperlipidemia, unspecified: Secondary | ICD-10-CM

## 2016-06-13 DIAGNOSIS — I10 Essential (primary) hypertension: Secondary | ICD-10-CM

## 2016-06-13 DIAGNOSIS — I2584 Coronary atherosclerosis due to calcified coronary lesion: Secondary | ICD-10-CM

## 2016-06-13 DIAGNOSIS — I2510 Atherosclerotic heart disease of native coronary artery without angina pectoris: Secondary | ICD-10-CM | POA: Diagnosis not present

## 2016-06-13 NOTE — Patient Instructions (Signed)
Medication Instructions:  Your physician recommends that you continue on your current medications as directed. Please refer to the Current Medication list given to you today.   Labwork: BMET  Testing/Procedures: none  Follow-Up: Your physician wants you to follow-up in: one year with Dr. Fletcher Anon.  You will receive a reminder letter in the mail two months in advance. If you don't receive a letter, please call our office to schedule the follow-up appointment.   Any Other Special Instructions Will Be Listed Below (If Applicable).     If you need a refill on your cardiac medications before your next appointment, please call your pharmacy.

## 2016-06-13 NOTE — Progress Notes (Signed)
Cardiology Office Note   Date:  06/13/2016   ID:  Caroline Gray, DOB 1946/09/09, MRN [id number]  PCP:  Rica Mast, MD  Cardiologist:   Kathlyn Sacramento, MD   Chief Complaint  Patient presents with  . Other    2 week follow up. Meds reviewed by the patient verbally. "doing well."       History of Present Illness: Caroline Gray is a 70 y.o. female who presents for urgent evaluation regarding chest pain. She is well-known to me with history of coronary atherosclerosis noted on CT scan.  She had a normal nuclear stress test in 2015. She has chronic medical conditions that include type 2 diabetes which was diagnosed in the early 90s, hypertension, hyperlipidemia and severe psoriasis. She also has family history of coronary artery disease.  She was seen recently for a prolonged episode of chest pain. Troponin came back negative. Her blood pressure was elevated. Small dose losartan was added. She underwent a treadmill nuclear stress test which showed no evidence of ischemia with normal ejection fraction. She has been doing better with no further episodes of chest pain. No dizziness.   Past Medical History:  Diagnosis Date  . Adenomatous colon polyp   . Arthritis   . Cataract   . Diabetes mellitus   . Diverticulosis   . Guillain-Barre ([place])   . History of kidney stones   . Hx: UTI (urinary tract infection)   . Hyperlipidemia   . Hypertension   . IBS (irritable bowel syndrome)   . Positive PPD   . Psoriasis     Past Surgical History:  Procedure Laterality Date  . ABDOMINAL HYSTERECTOMY    . APPENDECTOMY    . CATARACT EXTRACTION    . CHOLECYSTECTOMY    . LITHOTRIPSY  3.7.13   Dr Jacqlyn Larsen  . ROTATOR CUFF REPAIR  08/2005  . SPINE SURGERY       Current Outpatient Prescriptions  Medication Sig Dispense Refill  . aspirin 81 MG tablet Take 1 tablet (81 mg total) by mouth daily. 30 tablet   . calcipotriene (DOVONOX) 0.005 % cream Apply topically 2 (two) times daily.    .  Calcium Citrate-Vitamin D (CALCIUM CITRATE + D PO) Take 1 capsule by mouth 2 (two) times daily.      . clobetasol ointment (TEMOVATE) [id number] % Apply 1 application topically 2 (two) times daily.    . cyanocobalamin (,VITAMIN B-12,) 1000 MCG/ML injection Inject 1 mL (1,000 mcg total) into the muscle every 30 (thirty) days. 30 mL 0  . DULoxetine (CYMBALTA) 30 MG capsule Take 1 capsule (30 mg total) by mouth daily. 30 capsule 3  . gabapentin (NEURONTIN) 300 MG capsule TAKE ONE CAPSULE BY MOUTH THREE TIMES DAILY 90 capsule 9  . glucose blood test strip Test 2 times daily with Accu-Chek monitor 100 each 3  . Insulin Glargine (LANTUS SOLOSTAR) 100 UNIT/ML Solostar Pen Inject 25 Units into the skin daily at 10 pm. 15 mL 11  . Insulin Pen Needle 33G X 8 MM MISC 1 application by Does not apply route daily. 100 each 11  . Ixekizumab (TALTZ ) Inject into the skin. Pt uses every two weeks.    Marland Kitchen losartan (COZAAR) 25 MG tablet Take 1 tablet (25 mg total) by mouth daily. 30 tablet 3  . metFORMIN (GLUCOPHAGE) 1000 MG tablet TAKE 1 TABLET BY MOUTH ONCE A DAY 90 tablet 3  . simvastatin (ZOCOR) 40 MG tablet TAKE 1 TABLET BY MOUTH EVERY  NIGHT AT BEDTIME 90 tablet 3   No current facility-administered medications for this visit.     Allergies:   Codeine; Influenza vaccines; Ioxaglate; Ivp dye [iodinated diagnostic agents]; Macrodantin; and Nitrofurantoin    Social History:  The patient  reports that she quit smoking about 16 years ago. Her smoking use included Cigarettes. She quit after 15.00 years of use. She has never used smokeless tobacco. She reports that she drinks alcohol. She reports that she does not use drugs.   Family History:  The patient's family history includes Diabetes in her mother; Heart attack in her paternal aunt, paternal aunt, paternal aunt, paternal aunt, paternal uncle, paternal uncle, paternal uncle, and paternal uncle; Other in her son.    ROS:  Please see the history of present illness.    Otherwise, review of systems are positive for none.   All other systems are reviewed and negative.    PHYSICAL EXAM: VS:  BP (!) 100/58 (BP Location: Left Arm, Patient Position: Sitting, Cuff Size: Normal)   Pulse (!) 101   Ht 5\' 3"  (1.6 m)   Wt 170 lb (77.1 kg)   BMI 30.11 kg/m  , BMI Body mass index is 30.11 kg/m. GEN: Well nourished, well developed, in no acute distress  HEENT: normal  Neck: no JVD, carotid bruits, or masses Cardiac: RRR; no murmurs, rubs, or gallops,no edema  Respiratory:  clear to auscultation bilaterally, normal work of breathing GI: soft, nontender, nondistended, + BS MS: no deformity or atrophy  Skin: warm and dry, no rash Neuro:  Strength and sensation are intact Psych: euthymic mood, full affect   EKG:  EKG is ordered today. The ekg ordered today demonstrates normal sinus rhythm with no significant ST or T wave changes   Recent Labs: 04/16/2016: ALT 19; BUN 13; Creatinine, Ser 0.72; Potassium 4.4; Sodium 140    Lipid Panel    Component Value Date/Time   CHOL 137 07/06/2015 0957   TRIG 142.0 07/06/2015 0957   HDL 33.30 (L) 07/06/2015 0957   CHOLHDL 4 07/06/2015 0957   VLDL 28.4 07/06/2015 0957   LDLCALC 75 07/06/2015 0957   LDLDIRECT 74.7 11/05/2013 1034      Wt Readings from Last 3 Encounters:  06/13/16 170 lb (77.1 kg)  05/30/16 170 lb 12 oz (77.5 kg)  04/16/16 168 lb 12.8 oz (76.6 kg)       ASSESSMENT AND PLAN:  1.   Chest pain: Resolved with no recurrent symptoms. Recent treadmill nuclear stress test showed no evidence of ischemia with normal ejection fraction.  2. Coronary atherosclerosis: Noted on previous CT scan. Continue low-dose aspirin and treatment of risk factors.  3. Essential hypertension: Blood pressure is now normal on small dose losartan. I requested basic metabolic profile.  3. Hyperlipidemia: Continue treatment with simvastatin. Most recent LDL was 75.      Disposition:   FU with me in 1 year.    Signed,  Kathlyn Sacramento, MD  06/13/2016 10:51 AM    [place]

## 2016-06-14 ENCOUNTER — Other Ambulatory Visit: Payer: Self-pay

## 2016-06-14 DIAGNOSIS — E875 Hyperkalemia: Secondary | ICD-10-CM

## 2016-06-14 LAB — BASIC METABOLIC PANEL
BUN/Creatinine Ratio: 16 (ref 12–28)
BUN: 14 mg/dL (ref 8–27)
CHLORIDE: 99 mmol/L (ref 96–106)
CO2: 26 mmol/L (ref 18–29)
Calcium: 10 mg/dL (ref 8.7–10.3)
Creatinine, Ser: 0.87 mg/dL (ref 0.57–1.00)
GFR, EST AFRICAN AMERICAN: 77 mL/min/{1.73_m2} (ref >59)
GFR, EST NON AFRICAN AMERICAN: 67 mL/min/{1.73_m2} (ref >59)
Glucose: 176 mg/dL — ABNORMAL HIGH (ref 65–99)
POTASSIUM: 6 mmol/L — AB (ref 3.5–5.2)
SODIUM: 141 mmol/L (ref 134–144)

## 2016-06-17 ENCOUNTER — Other Ambulatory Visit
Admission: RE | Admit: 2016-06-17 | Discharge: 2016-06-17 | Disposition: A | Discharge status: Home / Self Care | Payer: Medicare Other | Source: Ambulatory Visit | Attending: Cardiovascular Disease | Admitting: Cardiovascular Disease

## 2016-06-17 DIAGNOSIS — E875 Hyperkalemia: Secondary | ICD-10-CM | POA: Diagnosis present

## 2016-06-17 LAB — POTASSIUM: POTASSIUM: 4.4 mmol/L (ref 3.5–5.1)

## 2016-06-18 ENCOUNTER — Encounter: Payer: Self-pay | Admitting: Cardiovascular Disease

## 2016-06-26 ENCOUNTER — Encounter: Payer: Self-pay | Admitting: Cardiovascular Disease

## 2016-07-01 ENCOUNTER — Other Ambulatory Visit: Payer: Self-pay

## 2016-07-01 MED ORDER — GABAPENTIN 300 MG PO CAPS: 300.0000 mg | ORAL_CAPSULE | Freq: Three times a day (TID) | ORAL | 1 refills | Status: DC

## 2016-07-05 ENCOUNTER — Other Ambulatory Visit: Payer: Self-pay

## 2016-07-05 DIAGNOSIS — E1140 Type 2 diabetes mellitus with diabetic neuropathy, unspecified: Secondary | ICD-10-CM

## 2016-07-05 DIAGNOSIS — E1165 Type 2 diabetes mellitus with hyperglycemia: Secondary | ICD-10-CM

## 2016-07-05 DIAGNOSIS — Z794 Long term (current) use of insulin: Secondary | ICD-10-CM

## 2016-07-05 DIAGNOSIS — IMO0002 Reserved for concepts with insufficient information to code with codable children: Secondary | ICD-10-CM

## 2016-07-05 DIAGNOSIS — F4323 Adjustment disorder with mixed anxiety and depressed mood: Secondary | ICD-10-CM

## 2016-07-05 MED ORDER — DULOXETINE HCL 30 MG PO CPEP: 30.0000 mg | ORAL_CAPSULE | Freq: Every day | ORAL | 0 refills | Status: DC

## 2016-07-05 MED ORDER — GABAPENTIN 300 MG PO CAPS: 300.0000 mg | ORAL_CAPSULE | Freq: Three times a day (TID) | ORAL | 1 refills | Status: DC

## 2016-07-05 MED ORDER — INSULIN GLARGINE 100 UNIT/ML SOLOSTAR PEN: 25.0000 [IU] | PEN_INJECTOR | Freq: Every day | SUBCUTANEOUS | 0 refills | Status: DC

## 2016-07-05 MED ORDER — SIMVASTATIN 40 MG PO TABS: 40.0000 mg | ORAL_TABLET | Freq: Every day | ORAL | 0 refills | Status: DC

## 2016-07-05 NOTE — Telephone Encounter (Signed)
Refill request from [facility] for Gabapentin but for 90 days supply-this was this filled on 8/14 but for 30 day supply. Next office visit with you will be on 9/7. Please review-aa

## 2016-07-11 ENCOUNTER — Ambulatory Visit: Payer: Medicare Other | Admitting: Cardiovascular Disease

## 2016-07-17 ENCOUNTER — Ambulatory Visit: Payer: Medicare Other | Admitting: Internal Medicine

## 2016-07-25 ENCOUNTER — Other Ambulatory Visit (INDEPENDENT_AMBULATORY_CARE_PROVIDER_SITE_OTHER): Payer: Medicare Other

## 2016-07-25 ENCOUNTER — Encounter: Payer: Self-pay | Admitting: Family Medicine

## 2016-07-25 ENCOUNTER — Ambulatory Visit (INDEPENDENT_AMBULATORY_CARE_PROVIDER_SITE_OTHER): Payer: Medicare Other | Admitting: Family Medicine

## 2016-07-25 VITALS — BP 128/64 | HR 94 | Temp 98.0°F | Wt 174.4 lb

## 2016-07-25 DIAGNOSIS — Z794 Long term (current) use of insulin: Secondary | ICD-10-CM

## 2016-07-25 DIAGNOSIS — F4323 Adjustment disorder with mixed anxiety and depressed mood: Secondary | ICD-10-CM | POA: Diagnosis not present

## 2016-07-25 DIAGNOSIS — R030 Elevated blood-pressure reading, without diagnosis of hypertension: Secondary | ICD-10-CM

## 2016-07-25 DIAGNOSIS — E1140 Type 2 diabetes mellitus with diabetic neuropathy, unspecified: Secondary | ICD-10-CM

## 2016-07-25 DIAGNOSIS — F329 Major depressive disorder, single episode, unspecified: Secondary | ICD-10-CM

## 2016-07-25 DIAGNOSIS — E538 Deficiency of other specified B group vitamins: Secondary | ICD-10-CM | POA: Diagnosis not present

## 2016-07-25 DIAGNOSIS — IMO0002 Reserved for concepts with insufficient information to code with codable children: Secondary | ICD-10-CM

## 2016-07-25 DIAGNOSIS — E785 Hyperlipidemia, unspecified: Secondary | ICD-10-CM

## 2016-07-25 DIAGNOSIS — IMO0001 Reserved for inherently not codable concepts without codable children: Secondary | ICD-10-CM

## 2016-07-25 DIAGNOSIS — E1165 Type 2 diabetes mellitus with hyperglycemia: Secondary | ICD-10-CM

## 2016-07-25 DIAGNOSIS — R109 Unspecified abdominal pain: Secondary | ICD-10-CM

## 2016-07-25 DIAGNOSIS — L409 Psoriasis, unspecified: Secondary | ICD-10-CM

## 2016-07-25 DIAGNOSIS — E119 Type 2 diabetes mellitus without complications: Secondary | ICD-10-CM

## 2016-07-25 DIAGNOSIS — F32A Depression, unspecified: Secondary | ICD-10-CM | POA: Insufficient documentation

## 2016-07-25 DIAGNOSIS — R10A1 Flank pain, right side: Secondary | ICD-10-CM | POA: Insufficient documentation

## 2016-07-25 LAB — COMPREHENSIVE METABOLIC PANEL
ALT: 30 U/L (ref 0–35)
AST: 41 U/L — ABNORMAL HIGH (ref 0–37)
Albumin: 3.6 g/dL (ref 3.5–5.2)
Alkaline Phosphatase: 103 U/L (ref 39–117)
BILIRUBIN TOTAL: 0.5 mg/dL (ref 0.2–1.2)
BUN: 13 mg/dL (ref 6–23)
CALCIUM: 9.3 mg/dL (ref 8.4–10.5)
CO2: 32 meq/L (ref 19–32)
Chloride: 101 mEq/L (ref 96–112)
Creatinine, Ser: 0.8 mg/dL (ref 0.40–1.20)
GFR: 74.85 mL/min (ref >60.00)
Glucose, Bld: 271 mg/dL — ABNORMAL HIGH (ref 70–99)
Potassium: 4.2 mEq/L (ref 3.5–5.1)
Sodium: 137 mEq/L (ref 135–145)
Total Protein: 7.6 g/dL (ref 6.0–8.3)

## 2016-07-25 LAB — CBC WITH DIFFERENTIAL/PLATELET
BASOS ABS: 0 10*3/uL (ref 0.0–0.1)
Basophils Relative: 0.5 % (ref 0.0–3.0)
Eosinophils Absolute: 0.2 10*3/uL (ref 0.0–0.7)
Eosinophils Relative: 3.6 % (ref 0.0–5.0)
HEMATOCRIT: 42 % (ref 36.0–46.0)
Hemoglobin: 14.5 g/dL (ref 12.0–15.0)
LYMPHS PCT: 38.2 % (ref 12.0–46.0)
Lymphs Abs: 2.5 10*3/uL (ref 0.7–4.0)
MCHC: 34.4 g/dL (ref 30.0–36.0)
MCV: 95.5 fl (ref 78.0–100.0)
MONOS PCT: 8 % (ref 3.0–12.0)
Monocytes Absolute: 0.5 10*3/uL (ref 0.1–1.0)
Neutro Abs: 3.3 10*3/uL (ref 1.4–7.7)
Neutrophils Relative %: 49.7 % (ref 43.0–77.0)
Platelets: 153 10*3/uL (ref 150.0–400.0)
RBC: 4.4 Mil/uL (ref 3.87–5.11)
RDW: 12.9 % (ref 11.5–15.5)
WBC: 6.7 10*3/uL (ref 4.0–10.5)

## 2016-07-25 LAB — POCT URINALYSIS DIPSTICK
BILIRUBIN UA: NEGATIVE
Glucose, UA: 500
Ketones, UA: NEGATIVE
NITRITE UA: NEGATIVE
PH UA: 5.5
PROTEIN UA: 30
Spec Grav, UA: 1.025
UROBILINOGEN UA: 0.2

## 2016-07-25 LAB — URINALYSIS, MICROSCOPIC ONLY

## 2016-07-25 LAB — SEDIMENTATION RATE: Sed Rate: 49 mm/hr — ABNORMAL HIGH (ref 0–30)

## 2016-07-25 LAB — MICROALBUMIN / CREATININE URINE RATIO
CREATININE, U: 141.5 mg/dL
Microalb Creat Ratio: 6.4 mg/g (ref 0.0–30.0)
Microalb, Ur: 9.1 mg/dL — ABNORMAL HIGH (ref 0.0–1.9)

## 2016-07-25 MED ORDER — DULOXETINE HCL 20 MG PO CPEP: ORAL_CAPSULE | ORAL | 0 refills | Status: DC

## 2016-07-25 MED ORDER — CYANOCOBALAMIN 1000 MCG/ML IJ SOLN
1000.0000 ug | Freq: Once | INTRAMUSCULAR | Status: AC
  Administered 2016-07-25: 1000 ug via INTRAMUSCULAR

## 2016-07-25 NOTE — Assessment & Plan Note (Signed)
No depression at this time. Patient wants to taper off of Cymbalta. Taper per prescription below. Advised to monitor for withdrawal symptoms.

## 2016-07-25 NOTE — Assessment & Plan Note (Addendum)
In the normal range. No recurrence of chest pain. She had negative workup to cardiology. Continue to monitor.

## 2016-07-25 NOTE — Assessment & Plan Note (Signed)
Given B12 injection today. °

## 2016-07-25 NOTE — Patient Instructions (Signed)
Nice to see you. We are going to increase your Lantus to 38 units daily. Continue your metformin. Please monitor your blood sugars with this and if they are persistently less than 80 please let us know. We'll check some lab work urine to evaluate your right flank pain. If you develop worsening pain, blood in your urine, fevers, or any new or change in symptoms please seek medical attention.

## 2016-07-25 NOTE — Assessment & Plan Note (Signed)
Continue to follow with dermatology. Discussed that I did not think changing her Lantus to another medication would be beneficial.

## 2016-07-25 NOTE — Addendum Note (Signed)
Addended by: Frutoso Chase A on: 07/25/2016 12:06 PM   Modules accepted: Orders

## 2016-07-25 NOTE — Progress Notes (Signed)
Pre visit review using our clinic review tool, if applicable. No additional management support is needed unless otherwise documented below in the visit note. 

## 2016-07-25 NOTE — Assessment & Plan Note (Signed)
Tolerating medication. We'll check a CMP to ensure that she is not having any liver damage causing her right flank pain.

## 2016-07-25 NOTE — Assessment & Plan Note (Addendum)
She has a several week history of right flank pain. Could be related to kidney stone, musculoskeletal cause, or intra-abdominal cause. Suspect more likely musculoskeletal given tenderness in her back though could also be kidney stone. Doubt appendicitis cause given only mild tenderness and persistent discomfort without progression. Does have a history of diverticulitis though location is odd for this. We will check lab work as outlined below. We'll check her urine as well. If negative we'll treat for MSK cause if grossly abnormal sedimentation rate or WBC would consider CT scan abdomen and pelvis. Patient's given return precautions.

## 2016-07-25 NOTE — Assessment & Plan Note (Signed)
Uncontrolled. We'll increase Lantus to 38 units daily. She'll continue metformin. She will have her insurance company fax [place] the results of the A1c. If blood sugars less than 80 persistently at home she will let us know.

## 2016-07-25 NOTE — Progress Notes (Signed)
Tommi Rumps, MD Phone: [phone]  Caroline Gray is a 70 y.o. female who presents today for follow-up.  Elevated blood pressure: Patient had a several week period where she had high blood pressures. She saw her cardiologist after having had some chest pain in August and was started on losartan. She had elevated potassium with this and they discontinued losartan as her blood pressures at home have been in the normal range. She reports since coming off the losartan her blood pressures have been in the normal range. She's not had any recurrent chest pain. She had a negative stress test. She's had no shortness of breath or swelling.  DIABETES Disease Monitoring: Blood Sugar ranges-120 5 in the morning and 200 or slightly higher in the evening Polyuria/phagia/dipsia- slight polydipsia      ophthalmology- saw about a month ago Reports she had an A1c checked her insurance company about a month and half ago. She will have them fax this to [place]. Medications: Compliance- taking Lantus 35 units daily, and metformin Hypoglycemic symptoms- none recently  HYPERLIPIDEMIA Disease Monitoring: See symptoms for Hypertension Medications: Compliance- taking simvastatin Right upper quadrant pain- notes some right flank pain though no specific right upper quadrant pain  Muscle aches- no  Psoriasis: Patient is followed by dermatology for this and they wanted her to discuss coming off of insulin as this can reportedly cause her psoriasis to worsen.  Patient notes for the last 4 weeks she's had intermittent pain in her right side. Hurts mostly in the morning. Feels more like arthritis type pain. It is intermittent. She notes no gross hematuria. No urinary complaints. Mild nausea. No vomiting or diarrhea. No blood in her stool. She has a history of kidney stones and has persistent stones in her right kidney. This has not worsened. She notes no numbness or weakness in her legs. No saddle anesthesia, loss of bowel or  bladder function, or fevers. No radiation down her legs.  Patient additionally notes for the last several weeks she has been bruising more easily. She notes no bleeding from anywhere. She is on aspirin 81 mg daily.  She additionally wants to come off her Cymbalta. She was on this previously for depression. Does not feel depressed at this time.  PMH: Former smokers   ROS see history of present illness  Objective  Physical Exam Vitals:   07/25/16 0826  BP: 128/64  Pulse: 94  Temp: 98 F (36.7 C)    BP Readings from Last 3 Encounters:  07/25/16 128/64  06/13/16 (!) 100/58  05/30/16 (!) 153/81   Wt Readings from Last 3 Encounters:  07/25/16 174 lb 6.4 oz (79.1 kg)  06/13/16 170 lb (77.1 kg)  05/30/16 170 lb 12 oz (77.5 kg)    Physical Exam  Constitutional: No distress.  HENT:  Head: Normocephalic and atraumatic.  Cardiovascular: Normal rate, regular rhythm and normal heart sounds.   Pulmonary/Chest: Effort normal and breath sounds normal.  Abdominal: Soft. Bowel sounds are normal. She exhibits no distension. There is tenderness (mild right flank tenderness and RLQ tenderness). There is no rebound and no guarding.  Musculoskeletal:  No midline spine TTP or step off, right lumbar spine muscular back tenderness  Neurological: She is alert. Gait normal.  Stands easily from chair on her own  Skin: Skin is warm and dry. She is not diaphoretic.  Psychiatric: Mood and affect normal.     Assessment/Plan: Please see individual problem list.  B12 deficiency Given B12 injection today.  Diabetes mellitus  type 2, uncontrolled Uncontrolled. We'll increase Lantus to 38 units daily. She'll continue metformin. She will have her insurance company fax [place] the results of the A1c. If blood sugars less than 80 persistently at home she will let us know.  Psoriasis Continue to follow with dermatology. Discussed that I did not think changing her Lantus to another medication would be  beneficial.  Hyperlipidemia Tolerating medication. We'll check a CMP to ensure that she is not having any liver damage causing her right flank pain.  Elevated BP In the normal range. No recurrence of chest pain. She had negative workup to cardiology. Continue to monitor.  Right flank pain She has a several week history of right flank pain. Could be related to kidney stone, musculoskeletal cause, or intra-abdominal cause. Suspect more likely musculoskeletal given tenderness in her back though could also be kidney stone. Doubt appendicitis cause given only mild tenderness and persistent discomfort without progression. Does have a history of diverticulitis though location is odd for this. We will check lab work as outlined below. We'll check her urine as well. If negative we'll treat for MSK cause if grossly abnormal sedimentation rate or WBC would consider CT scan abdomen and pelvis. Patient's given return precautions.  Depression No depression at this time. Patient wants to taper off of Cymbalta. Taper per prescription below. Advised to monitor for withdrawal symptoms.   Orders Placed This Encounter  Procedures  . Urine Microalbumin w/creat. ratio  . Comp Met (CMET)  . CBC w/Diff  . Sed Rate (ESR)  . POCT Urinalysis Dipstick    Meds ordered this encounter  Medications  . DULoxetine (CYMBALTA) 20 MG capsule    Sig: Please take 1 tablet (20 mg) by mouth daily for 2 weeks, then take 1 tablet (20 mg) by mouth every other day for 2 weeks, then discontinue    Dispense:  30 capsule    Refill:  0  . cyanocobalamin ((VITAMIN B-12)) injection 1,000 mcg    Tommi Rumps, MD [place]

## 2016-07-27 LAB — URINE CULTURE: Organism ID, Bacteria: 10000

## 2016-08-03 ENCOUNTER — Other Ambulatory Visit: Payer: Self-pay | Admitting: Family Medicine

## 2016-08-03 DIAGNOSIS — Z794 Long term (current) use of insulin: Principal | ICD-10-CM

## 2016-08-03 DIAGNOSIS — E119 Type 2 diabetes mellitus without complications: Secondary | ICD-10-CM

## 2016-08-03 DIAGNOSIS — R7989 Other specified abnormal findings of blood chemistry: Secondary | ICD-10-CM

## 2016-08-03 DIAGNOSIS — R945 Abnormal results of liver function studies: Secondary | ICD-10-CM

## 2016-08-05 ENCOUNTER — Other Ambulatory Visit: Payer: Self-pay

## 2016-08-05 DIAGNOSIS — E1165 Type 2 diabetes mellitus with hyperglycemia: Secondary | ICD-10-CM

## 2016-08-05 DIAGNOSIS — E118 Type 2 diabetes mellitus with unspecified complications: Principal | ICD-10-CM

## 2016-08-05 MED ORDER — INSULIN PEN NEEDLE 33G X 8 MM MISC: 1.0000 "application " | Freq: Every day | 11 refills | Status: AC

## 2016-08-15 ENCOUNTER — Other Ambulatory Visit (INDEPENDENT_AMBULATORY_CARE_PROVIDER_SITE_OTHER): Payer: Medicare Other

## 2016-08-15 DIAGNOSIS — R7989 Other specified abnormal findings of blood chemistry: Secondary | ICD-10-CM | POA: Diagnosis not present

## 2016-08-15 DIAGNOSIS — E119 Type 2 diabetes mellitus without complications: Secondary | ICD-10-CM

## 2016-08-15 DIAGNOSIS — R945 Abnormal results of liver function studies: Principal | ICD-10-CM

## 2016-08-15 DIAGNOSIS — Z794 Long term (current) use of insulin: Secondary | ICD-10-CM

## 2016-08-15 LAB — HEPATIC FUNCTION PANEL
ALT: 24 U/L (ref 0–35)
AST: 36 U/L (ref 0–37)
Albumin: 3.5 g/dL (ref 3.5–5.2)
Alkaline Phosphatase: 90 U/L (ref 39–117)
BILIRUBIN DIRECT: 0 mg/dL (ref 0.0–0.3)
BILIRUBIN TOTAL: 0.4 mg/dL (ref 0.2–1.2)
Total Protein: 7.7 g/dL (ref 6.0–8.3)

## 2016-08-15 LAB — HEMOGLOBIN A1C: HEMOGLOBIN A1C: 8.2 % — AB (ref 4.6–6.5)

## 2016-08-21 ENCOUNTER — Telehealth: Payer: Self-pay | Admitting: *Deleted

## 2016-08-21 MED ORDER — METFORMIN HCL 1000 MG PO TABS: 1000.0000 mg | ORAL_TABLET | Freq: Two times a day (BID) | ORAL | 3 refills | Status: AC

## 2016-08-21 NOTE — Telephone Encounter (Signed)
Sent to pharmacy 

## 2016-08-21 NOTE — Telephone Encounter (Signed)
Patient requested lab results  Pt contact [phone]

## 2016-08-21 NOTE — Telephone Encounter (Signed)
Notified patient of message to increase her metformin to 1000 mg twice a day and use the lantus 38 units at bedtime. Patient verbalized understanding. Please send in new RX for Metformin.

## 2016-08-27 ENCOUNTER — Ambulatory Visit (INDEPENDENT_AMBULATORY_CARE_PROVIDER_SITE_OTHER): Payer: Medicare Other

## 2016-08-27 DIAGNOSIS — E538 Deficiency of other specified B group vitamins: Secondary | ICD-10-CM | POA: Diagnosis not present

## 2016-08-27 MED ORDER — CYANOCOBALAMIN 1000 MCG/ML IJ SOLN
1000.0000 ug | Freq: Once | INTRAMUSCULAR | Status: AC
  Administered 2016-08-27: 1000 ug via INTRAMUSCULAR

## 2016-08-27 NOTE — Progress Notes (Signed)
I have reviewed the above note and agree.  Estefanie Cornforth, M.D.  

## 2016-08-27 NOTE — Progress Notes (Signed)
Patient came in for b12 injection.  Received in Left deltoid.  Patient tolerated well  

## 2016-10-01 ENCOUNTER — Ambulatory Visit (INDEPENDENT_AMBULATORY_CARE_PROVIDER_SITE_OTHER): Payer: Medicare Other

## 2016-10-01 DIAGNOSIS — E538 Deficiency of other specified B group vitamins: Secondary | ICD-10-CM | POA: Diagnosis not present

## 2016-10-01 MED ORDER — CYANOCOBALAMIN 1000 MCG/ML IJ SOLN
1000.0000 ug | Freq: Once | INTRAMUSCULAR | Status: AC
  Administered 2016-10-01: 1000 ug via INTRAMUSCULAR

## 2016-10-01 NOTE — Progress Notes (Signed)
Patient comes in for B 12 injection.   Injected right deltoid patient tolerated well.

## 2016-10-02 ENCOUNTER — Telehealth: Payer: Self-pay

## 2016-10-02 NOTE — Addendum Note (Signed)
Addended by: Caryl Bis Twisha Vanpelt G on: 10/02/2016 12:36 PM   Modules accepted: Orders

## 2016-10-02 NOTE — Progress Notes (Signed)
I have reviewed the above note and agree. Please get the patient set up for a lab appointment for a B12 checked prior to her next B12 injection.  Tommi Rumps, M.D.

## 2016-10-02 NOTE — Telephone Encounter (Signed)
Pt is on the lab schedule for fasting labs on 10/03/16. Pt recently had labs in September; please advise on what is to be drawn. Thank you.

## 2016-10-02 NOTE — Telephone Encounter (Signed)
FYI

## 2016-10-02 NOTE — Telephone Encounter (Signed)
I reviewed chart again after receiving reply. An order for B12 check has been placed. Pt received B12 injection on 10/01/16 and was advised to schedule lab appt before receiving another B12 injection.

## 2016-10-02 NOTE — Telephone Encounter (Signed)
I am unsure exactly why she is coming in for labs. I reviewed her most recent lab work and there is no indication to recheck at this time. Can you please check with the patient regarding this to see if we asked her to come in for lab work. Thanks.

## 2016-10-03 ENCOUNTER — Other Ambulatory Visit (INDEPENDENT_AMBULATORY_CARE_PROVIDER_SITE_OTHER): Payer: Medicare Other

## 2016-10-03 DIAGNOSIS — E538 Deficiency of other specified B group vitamins: Secondary | ICD-10-CM | POA: Diagnosis not present

## 2016-10-03 LAB — VITAMIN B12: Vitamin B-12: 1058 pg/mL — ABNORMAL HIGH (ref 211–911)

## 2016-10-03 NOTE — Progress Notes (Signed)
Patient has lab appt 10/03/16

## 2016-10-05 ENCOUNTER — Encounter: Payer: Self-pay | Admitting: *Deleted

## 2016-10-22 ENCOUNTER — Encounter: Payer: Self-pay | Admitting: Family Medicine

## 2016-10-22 LAB — HM DIABETES EYE EXAM

## 2016-10-24 ENCOUNTER — Ambulatory Visit: Payer: TRICARE For Life (TFL) | Admitting: Family Medicine

## 2016-11-05 ENCOUNTER — Ambulatory Visit: Payer: Medicare Other

## 2016-11-08 ENCOUNTER — Other Ambulatory Visit: Payer: Self-pay

## 2016-11-08 DIAGNOSIS — E1140 Type 2 diabetes mellitus with diabetic neuropathy, unspecified: Secondary | ICD-10-CM

## 2016-11-08 DIAGNOSIS — Z794 Long term (current) use of insulin: Principal | ICD-10-CM

## 2016-11-08 DIAGNOSIS — E1165 Type 2 diabetes mellitus with hyperglycemia: Principal | ICD-10-CM

## 2016-11-08 DIAGNOSIS — IMO0002 Reserved for concepts with insufficient information to code with codable children: Secondary | ICD-10-CM

## 2016-11-08 MED ORDER — INSULIN GLARGINE 100 UNIT/ML SOLOSTAR PEN: 38.0000 [IU] | PEN_INJECTOR | Freq: Every day | SUBCUTANEOUS | 0 refills | Status: DC

## 2016-11-13 ENCOUNTER — Other Ambulatory Visit: Payer: Self-pay

## 2016-11-13 DIAGNOSIS — E1165 Type 2 diabetes mellitus with hyperglycemia: Principal | ICD-10-CM

## 2016-11-13 DIAGNOSIS — IMO0002 Reserved for concepts with insufficient information to code with codable children: Secondary | ICD-10-CM

## 2016-11-13 DIAGNOSIS — Z794 Long term (current) use of insulin: Principal | ICD-10-CM

## 2016-11-13 DIAGNOSIS — E1140 Type 2 diabetes mellitus with diabetic neuropathy, unspecified: Secondary | ICD-10-CM

## 2016-11-13 MED ORDER — INSULIN GLARGINE 100 UNIT/ML SOLOSTAR PEN: 38.0000 [IU] | PEN_INJECTOR | Freq: Every day | SUBCUTANEOUS | 4 refills | Status: DC

## 2016-11-13 NOTE — Telephone Encounter (Signed)
Ok to fill 

## 2016-11-13 NOTE — Telephone Encounter (Signed)
Sent to pharmacy 

## 2016-11-18 ENCOUNTER — Encounter: Payer: Self-pay | Admitting: Family Medicine

## 2016-11-29 ENCOUNTER — Ambulatory Visit: Payer: TRICARE For Life (TFL) | Admitting: Family Medicine

## 2016-11-29 ENCOUNTER — Ambulatory Visit (INDEPENDENT_AMBULATORY_CARE_PROVIDER_SITE_OTHER): Payer: Medicare Other | Admitting: Family Medicine

## 2016-11-29 ENCOUNTER — Encounter: Payer: Self-pay | Admitting: Family Medicine

## 2016-11-29 VITALS — BP 142/76 | HR 96 | Temp 98.1°F | Wt 174.2 lb

## 2016-11-29 DIAGNOSIS — IMO0002 Reserved for concepts with insufficient information to code with codable children: Secondary | ICD-10-CM

## 2016-11-29 DIAGNOSIS — H539 Unspecified visual disturbance: Secondary | ICD-10-CM

## 2016-11-29 DIAGNOSIS — L409 Psoriasis, unspecified: Secondary | ICD-10-CM | POA: Diagnosis not present

## 2016-11-29 DIAGNOSIS — E1142 Type 2 diabetes mellitus with diabetic polyneuropathy: Secondary | ICD-10-CM | POA: Diagnosis not present

## 2016-11-29 DIAGNOSIS — E538 Deficiency of other specified B group vitamins: Secondary | ICD-10-CM

## 2016-11-29 DIAGNOSIS — R748 Abnormal levels of other serum enzymes: Secondary | ICD-10-CM | POA: Diagnosis not present

## 2016-11-29 DIAGNOSIS — R030 Elevated blood-pressure reading, without diagnosis of hypertension: Secondary | ICD-10-CM

## 2016-11-29 DIAGNOSIS — E1165 Type 2 diabetes mellitus with hyperglycemia: Secondary | ICD-10-CM

## 2016-11-29 DIAGNOSIS — Z794 Long term (current) use of insulin: Secondary | ICD-10-CM | POA: Diagnosis not present

## 2016-11-29 DIAGNOSIS — F4323 Adjustment disorder with mixed anxiety and depressed mood: Secondary | ICD-10-CM

## 2016-11-29 LAB — VITAMIN B12: Vitamin B-12: 559 pg/mL (ref 200–1100)

## 2016-11-29 LAB — HEMOGLOBIN A1C
HEMOGLOBIN A1C: 7 % — AB (ref <5.7)
MEAN PLASMA GLUCOSE: 154 mg/dL

## 2016-11-29 MED ORDER — DULOXETINE HCL 30 MG PO CPEP: 30.0000 mg | ORAL_CAPSULE | Freq: Every day | ORAL | 3 refills | Status: DC

## 2016-11-29 NOTE — Assessment & Plan Note (Signed)
Patient seems to be having some more frequent lows. Discussed decreasing her Lantus to 35 units prior to bed. Check A1c today. Discussed that if she continues to get low she should contact us for further instructions regarding her medications. She did voice understanding of measures to treat hypoglycemia

## 2016-11-29 NOTE — Progress Notes (Signed)
Pre visit review using our clinic review tool, if applicable. No additional management support is needed unless otherwise documented below in the visit note. 

## 2016-11-29 NOTE — Patient Instructions (Signed)
Nice to see you. Please decrease her Lantus dose to 35 units nightly. Please continue to follow with the eye doctor for your right eye. If you have worsening vision please seek medical attention. We'll start her on Cymbalta for your anxiety again. We'll check some lab work and contact her with the results.

## 2016-11-29 NOTE — Assessment & Plan Note (Signed)
Elevated today in the office. Had been in normal range. Minimally elevated for age. Discussed checking at home and contacting us in 1-2 weeks to let us know what it is been running.

## 2016-11-29 NOTE — Assessment & Plan Note (Signed)
Patient being followed by ophthalmology actively for this. Saw them yesterday and had a laser treatment on the right eye. Discussed continuing follow with ophthalmology and seeing the retinal specialist as planned. Neurologically intact. Given return precautions.

## 2016-11-29 NOTE — Progress Notes (Signed)
Tommi Rumps, MD Phone: [phone]  Caroline Gray is a 71 y.o. female who presents today for follow-up.  Diabetes: Checking sugars and they are typically between 76 and 150. In the morning they are oftentimes less than 100. Does get some lows in the middle the night she'll get up and eat something and this improves. Mostly doing Lantus 38 units though has occasionally dropped to 35 units depending on how low her sugar is at night before bed. Metformin 1000 mg twice daily. No polyuria or polydipsia.  Patient notes she has seen the ophthalmologist for some right eye issues. Note she had some vision loss and that she was seeing an optometrist who felt it was related to shingles that she previously had. She followed up with an ophthalmologist who felt it was related to retinal issue and they did laser treatment yesterday. Her vision is quite a bit better since the laser treatment. They're sending her to a retinal specialist.  Psoriasis is followed by [facility]. Note she is getting light treatment. Mostly affects her hands and feet. They've also switched her to fragrance free soaps.  Patient was previously getting B12 injections. No documented low B12. Most recent one was elevated.   Anxiety: Sometimes bothers her at night. Feels quite anxious at times. Cymbalta was previously helpful though this has been discontinued recently. Some mild depression. No SI or HI.  Patient notes no history of hypertension. Blood pressure minimally elevated today. Not on any medications for blood pressure.  PMH: Former smoker   ROS see history of present illness  Objective  Physical Exam Vitals:   11/29/16 1529  BP: (!) 142/76  Pulse: 96  Temp: 98.1 F (36.7 C)    BP Readings from Last 3 Encounters:  11/29/16 (!) 142/76  07/25/16 128/64  06/13/16 (!) 100/58   Wt Readings from Last 3 Encounters:  11/29/16 174 lb 3.2 oz (79 kg)  07/25/16 174 lb 6.4 oz (79.1 kg)  06/13/16 170 lb (77.1 kg)    Physical  Exam  Constitutional: No distress.  Cardiovascular: Normal rate, regular rhythm and normal heart sounds.   Pulmonary/Chest: Effort normal and breath sounds normal.  Neurological: She is alert. Gait normal.  CN 2-12 intact, 5/5 strength in bilateral biceps, triceps, grip, quads, hamstrings, plantar and dorsiflexion, sensation to light touch intact in bilateral UE and LE, normal gait, 2+ patellar reflexes  Skin: Skin is warm and dry. She is not diaphoretic.  Dry cracked skin on the palms and plantar surface of feet   Diabetic Foot Exam - Simple   Simple Foot Form Diabetic Foot exam was performed with the following findings:  Yes 11/29/2016  3:51 PM  Visual Inspection See comments:  Yes Sensation Testing See comments:  Yes Pulse Check Posterior Tibialis and Dorsalis pulse intact bilaterally:  Yes Comments Dry skin on plantar surfaces of bilateral feet, no other skin breakdown, deformities, ulcerations, mild decreased monofilament testing bilaterally, intact to light touch bilaterally     Assessment/Plan: Please see individual problem list.  Diabetes mellitus type 2, uncontrolled Patient seems to be having some more frequent lows. Discussed decreasing her Lantus to 35 units prior to bed. Check A1c today. Discussed that if she continues to get low she should contact us for further instructions regarding her medications. She did voice understanding of measures to treat hypoglycemia  Psoriasis Continue to follow with [facility].  B12 deficiency No signs of B12 deficiency on prior lab work. Recheck B12 today.  Adjustment disorder with mixed anxiety  and depressed mood Has had some mild worsening of symptoms since coming off Cymbalta. We will restart this. She'll follow-up in 2 months.  Elevated blood pressure reading Elevated today in the office. Had been in normal range. Minimally elevated for age. Discussed checking at home and contacting us in 1-2 weeks to let us know what it is been  running.  Vision changes Patient being followed by ophthalmology actively for this. Saw them yesterday and had a laser treatment on the right eye. Discussed continuing follow with ophthalmology and seeing the retinal specialist as planned. Neurologically intact. Given return precautions.   Orders Placed This Encounter  Procedures  . B12  . Hemoglobin A1C    Tommi Rumps, MD [place]

## 2016-11-29 NOTE — Assessment & Plan Note (Signed)
Has had some mild worsening of symptoms since coming off Cymbalta. We will restart this. She'll follow-up in 2 months.

## 2016-11-29 NOTE — Assessment & Plan Note (Signed)
No signs of B12 deficiency on prior lab work. Recheck B12 today.

## 2016-11-29 NOTE — Assessment & Plan Note (Signed)
Continue to follow with [facility].

## 2016-12-16 ENCOUNTER — Telehealth: Payer: Self-pay | Admitting: Family Medicine

## 2016-12-16 NOTE — Telephone Encounter (Signed)
noted 

## 2016-12-16 NOTE — Telephone Encounter (Signed)
Pt dropped off BP results that Dr. Caryl Bis requested. Paper is up front in Dr. Ellen Henri color folder.

## 2016-12-18 ENCOUNTER — Ambulatory Visit (INDEPENDENT_AMBULATORY_CARE_PROVIDER_SITE_OTHER): Payer: Medicare Other

## 2016-12-18 VITALS — BP 128/60 | HR 71 | Temp 97.6°F | Resp 14 | Ht 63.25 in | Wt 169.0 lb

## 2016-12-18 DIAGNOSIS — Z0000 Encounter for general adult medical examination without abnormal findings: Secondary | ICD-10-CM | POA: Diagnosis not present

## 2016-12-18 NOTE — Patient Instructions (Addendum)
  Caroline Gray , Thank you for taking time to come for your Medicare Wellness Visit. I appreciate your ongoing commitment to your health goals. Please review the following plan we discussed and let me know if I can assist you in the future.   Follow up with Dr. Caryl Bis as needed.  These are the goals we discussed: Goals    . Increase physical activity       This is a list of the screening recommended for you and due dates:  Health Maintenance  Topic Date Due  . Flu Shot  08/30/2017*  . Tetanus Vaccine  12/19/2026*  . Pneumonia vaccines (1 of 2 - PCV13) 12/19/2026*  . Mammogram  12/21/2016  . Hemoglobin A1C  05/29/2017  . Urine Protein Check  07/25/2017  . Colon Cancer Screening  07/29/2017  . Eye exam for diabetics  10/22/2017  . Complete foot exam   11/29/2017  . DEXA scan (bone density measurement)  Completed  . Shingles Vaccine  Addressed  .  Hepatitis C: One time screening is recommended by Center for Disease Control  (CDC) for  adults born from [birth year] through 1967.   Completed  *Topic was postponed. The date shown is not the original due date.

## 2016-12-18 NOTE — Progress Notes (Signed)
Subjective:   Caroline Gray is a 71 y.o. female who presents for Medicare Annual (Subsequent) preventive examination.  Review of Systems:  No ROS.  Medicare Wellness Visit.  Cardiac Risk Factors include: hypertension;advanced age (>71men, >71 women);diabetes mellitus        Objective:     Vitals: BP 128/60 (BP Location: Left Arm, Patient Position: Sitting, Cuff Size: Normal)   Pulse 71   Temp 97.6 F (36.4 C) (Oral)   Resp 14   Ht 5' 3.25" (1.607 m)   Wt 169 lb (76.7 kg)   SpO2 95%   BMI 29.70 kg/m   Body mass index is 29.7 kg/m.   Tobacco History  Smoking Status  . Former Smoker  . Years: 15.00  . Types: Cigarettes  . Quit date: 11/19/1999  Smokeless Tobacco  . Never Used     Counseling given: Not Answered   Past Medical History:  Diagnosis Date  . Adenomatous colon polyp   . Arthritis   . Cataract   . Diabetes mellitus   . Diverticulosis   . 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 History of kidney stones   . Hx: UTI (urinary tract infection)   . Hyperlipidemia   . Hypertension   . IBS (irritable bowel syndrome)   . Positive PPD   . Psoriasis    Past Surgical History:  Procedure Laterality Date  . ABDOMINAL HYSTERECTOMY    . APPENDECTOMY    . CATARACT EXTRACTION    . CHOLECYSTECTOMY    . LITHOTRIPSY  3.7.13   Dr Jacqlyn Larsen  . ROTATOR CUFF REPAIR  08/2005  . SPINE SURGERY     Family History  Problem Relation Age of Onset  . Diabetes Mother   . Heart attack Paternal Aunt   . Heart attack Paternal Uncle   . Heart attack Paternal Uncle   . Heart attack Paternal Uncle   . Heart attack Paternal Uncle   . Heart attack Paternal Aunt   . Heart attack Paternal Aunt   . Heart attack Paternal Aunt   . Other Son     Ulcerative Proctitis   . Colon cancer Neg Hx   . Colon polyps Neg Hx   . Rectal cancer Neg Hx   . Stomach cancer Neg Hx    History  Sexual Activity  . Sexual activity: Not Currently    Outpatient Encounter Prescriptions as of 12/18/2016  Medication  Sig  . aspirin 81 MG tablet Take 1 tablet (81 mg total) by mouth daily.  . calcipotriene (DOVONOX) 0.005 % cream Apply topically 2 (two) times daily.  . Calcium Citrate-Vitamin D (CALCIUM CITRATE + D PO) Take 1 capsule by mouth 2 (two) times daily.    . clobetasol ointment (TEMOVATE) [id number] % Apply 1 application topically 2 (two) times daily.  . DULoxetine (CYMBALTA) 30 MG capsule Take 1 capsule (30 mg total) by mouth daily.  Marland Kitchen gabapentin (NEURONTIN) 300 MG capsule Take 1 capsule (300 mg total) by mouth 3 (three) times daily.  Marland Kitchen glucose blood test strip Test 2 times daily with Accu-Chek monitor  . Insulin Glargine (LANTUS SOLOSTAR) 100 UNIT/ML Solostar Pen Inject 38 Units into the skin daily at 10 pm.  . Insulin Pen Needle 33G X 8 MM MISC 1 application by Does not apply route daily.  . metFORMIN (GLUCOPHAGE) 1000 MG tablet Take 1 tablet (1,000 mg total) by mouth 2 (two) times daily with a meal.  . simvastatin (ZOCOR) 40 MG tablet Take 1 tablet (40 mg  total) by mouth at bedtime.  . [DISCONTINUED] fluorometholone (FML) 0.1 % ophthalmic ointment 1 application 4 (four) times daily.   No facility-administered encounter medications on file as of 12/18/2016.     Activities of Daily Living In your present state of health, do you have any difficulty performing the following activities: 12/18/2016 12/19/2015  Hearing? N N  Vision? N N  Difficulty concentrating or making decisions? N N  Walking or climbing stairs? N N  Dressing or bathing? N N  Doing errands, shopping? N N  Preparing Food and eating ? N N  Using the Toilet? N N  In the past six months, have you accidently leaked urine? Y Y  Do you have problems with loss of bowel control? Y N  Managing your Medications? N N  Managing your Finances? N N  Housekeeping or managing your Housekeeping? N N  Some recent data might be hidden    Patient Care Team: Leone Haven, MD as PCP - General (Family Medicine)    Assessment:    This is a  routine wellness examination for Caroline Gray. The goal of the wellness visit is to assist the patient how to close the gaps in care and create a preventative care plan for the patient.   Taking calcium VIT D as appropriate/Osteoporosis risk reviewed.  Medications reviewed; taking without issues or barriers.  Safety issues reviewed; smoke detectors in the home. No firearms in the home. Wears seatbelts when driving or riding with others. No violence in the home.  No identified risk were noted; The patient was oriented x 3; appropriate in dress and manner and no objective failures at ADL's or IADL's.   BMI; discussed the importance of a healthy diet, water intake and exercise. Educational material provided.  HTN; followed by PCP.  Immunizations; not a candidate for vaccines due to Dx of Guillain-Barre.    Health maintenance gaps; closed.    Patient Concerns: None at this time. Follow up with PCP as needed.  Exercise Activities and Dietary recommendations Current Exercise Habits: Home exercise routine, Type of exercise: walking, Intensity: Mild  Goals    . Increase physical activity      Fall Risk Fall Risk  12/18/2016 04/16/2016 12/19/2015 08/30/2014 10/27/2012  Falls in the past year? No Yes No No No  Number falls in past yr: - 1 - - -  Injury with Fall? - No - - -   Depression Screen PHQ 2/9 Scores 12/18/2016 12/19/2015 08/30/2014 09/28/2012  PHQ - 2 Score 0 0 0 0     Cognitive Function MMSE - Mini Mental State Exam 12/19/2015  Orientation to time 5  Orientation to Place 5  Registration 3  Attention/ Calculation 5  Recall 3  Language- name 2 objects 2  Language- repeat 1  Language- follow 3 step command 3  Language- read & follow direction 1  Write a sentence 1  Copy design 1  Total score 30     6CIT Screen 12/18/2016  What Year? 0 points  What month? 0 points  What time? 0 points  Count back from 20 0 points  Months in reverse 0 points  Repeat phrase 0 points    Total Score 0     There is no immunization history on file for this patient. Screening Tests Health Maintenance  Topic Date Due  . INFLUENZA VACCINE  08/30/2017 (Originally 06/18/2016)  . TETANUS/TDAP  12/19/2026 (Originally 02/21/1965)  . PNA vac Low Risk Adult (1 of 2 - PCV13)  12/19/2026 (Originally 02/20/2009)  . MAMMOGRAM  12/21/2016  . HEMOGLOBIN A1C  05/29/2017  . URINE MICROALBUMIN  07/25/2017  . COLONOSCOPY  07/29/2017  . OPHTHALMOLOGY EXAM  10/22/2017  . FOOT EXAM  11/29/2017  . DEXA SCAN  Completed  . ZOSTAVAX  Addressed  . Hepatitis C Screening  Completed      Plan:    End of life planning; Advance aging; Advanced directives discussed. Copy of current HCPOA/Living Will requested.  Medicare Attestation I have personally reviewed: The patient's medical and social history Their use of alcohol, tobacco or illicit drugs Their current medications and supplements The patient's functional ability including ADLs,fall risks, home safety risks, cognitive, and hearing and visual impairment Diet and physical activities Evidence for depression   The patient's weight, height, BMI, and visual acuity have been recorded in the chart.  I have made referrals and provided education to the patient based on review of the above and I have provided the patient with a written personalized care plan for preventive services.    During the course of the visit the patient was educated and counseled about the following appropriate screening and preventive services:   Vaccines to include Pneumoccal, Influenza, Hepatitis B, Td, Zostavax, HCV  Electrocardiogram  Cardiovascular Disease  Colorectal cancer screening  Bone density screening  Diabetes screening  Glaucoma screening  Mammography/PAP  Nutrition counseling   Patient Instructions (the written plan) was given to the patient.   Varney Biles, LPN  [id number]

## 2016-12-22 NOTE — Progress Notes (Signed)
I have reviewed the above note and agree.  Joi Leyva, M.D.  

## 2017-01-13 ENCOUNTER — Telehealth: Payer: Self-pay | Admitting: Radiology

## 2017-01-13 ENCOUNTER — Ambulatory Visit (INDEPENDENT_AMBULATORY_CARE_PROVIDER_SITE_OTHER): Payer: Medicare Other | Admitting: Family Medicine

## 2017-01-13 ENCOUNTER — Ambulatory Visit
Admission: RE | Admit: 2017-01-13 | Discharge: 2017-01-13 | Disposition: A | Discharge status: Home / Self Care | Payer: Medicare Other | Source: Ambulatory Visit | Attending: Family Medicine | Admitting: Family Medicine

## 2017-01-13 ENCOUNTER — Telehealth: Payer: Self-pay | Admitting: Family Medicine

## 2017-01-13 ENCOUNTER — Encounter: Payer: Self-pay | Admitting: Family Medicine

## 2017-01-13 VITALS — BP 124/66 | HR 86 | Temp 98.1°F | Resp 18 | Wt 169.0 lb

## 2017-01-13 DIAGNOSIS — Z9049 Acquired absence of other specified parts of digestive tract: Secondary | ICD-10-CM | POA: Insufficient documentation

## 2017-01-13 DIAGNOSIS — K5730 Diverticulosis of large intestine without perforation or abscess without bleeding: Secondary | ICD-10-CM | POA: Diagnosis not present

## 2017-01-13 DIAGNOSIS — I700 Atherosclerosis of aorta: Secondary | ICD-10-CM | POA: Diagnosis not present

## 2017-01-13 DIAGNOSIS — R319 Hematuria, unspecified: Secondary | ICD-10-CM

## 2017-01-13 DIAGNOSIS — R1030 Lower abdominal pain, unspecified: Secondary | ICD-10-CM

## 2017-01-13 DIAGNOSIS — N200 Calculus of kidney: Secondary | ICD-10-CM | POA: Diagnosis not present

## 2017-01-13 DIAGNOSIS — K769 Liver disease, unspecified: Secondary | ICD-10-CM | POA: Diagnosis not present

## 2017-01-13 DIAGNOSIS — N132 Hydronephrosis with renal and ureteral calculous obstruction: Secondary | ICD-10-CM | POA: Diagnosis not present

## 2017-01-13 LAB — POCT URINALYSIS DIPSTICK
Glucose, UA: NEGATIVE
Nitrite, UA: NEGATIVE
PH UA: 5.5
SPEC GRAV UA: 1.02
UROBILINOGEN UA: 1

## 2017-01-13 LAB — URINALYSIS, MICROSCOPIC ONLY

## 2017-01-13 MED ORDER — HYDROCODONE-ACETAMINOPHEN 5-325 MG PO TABS: 1.0000 | ORAL_TABLET | Freq: Four times a day (QID) | ORAL | 0 refills | Status: DC | PRN

## 2017-01-13 MED ORDER — TAMSULOSIN HCL 0.4 MG PO CAPS: 0.4000 mg | ORAL_CAPSULE | Freq: Every day | ORAL | 0 refills | Status: DC

## 2017-01-13 NOTE — Progress Notes (Signed)
  Tommi Rumps, MD Phone: [phone]  Caroline Gray is a 71 y.o. female who presents today for same-day visit.  Patient notes onset of symptoms 3 days ago. Has had some low back discomfort on the right side radiating down to the right anterior lower abdomen. It's been associated with gross hematuria. No dysuria. No frequency. No urgency. No nausea vomiting or diarrhea. No fevers. No numbness. No weakness. No saddle anesthesia or loss of bowel or bladder function. She is not been taking anything for pain. 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  PMH: History of kidney stones ROS see history of present illness  Objective  Physical Exam Vitals:   01/13/17 0926  BP: 124/66  Pulse: 86  Resp: 18  Temp: 98.1 F (36.7 C)    BP Readings from Last 3 Encounters:  01/13/17 124/66  12/18/16 128/60  11/29/16 (!) 142/76   Wt Readings from Last 3 Encounters:  01/13/17 169 lb (76.7 kg)  12/18/16 169 lb (76.7 kg)  11/29/16 174 lb 3.2 oz (79 kg)    Physical Exam  Constitutional: No distress.  Cardiovascular: Normal rate, regular rhythm and normal heart sounds.   Pulmonary/Chest: Effort normal and breath sounds normal.  Abdominal: Soft. Bowel sounds are normal. She exhibits no distension. There is tenderness (in bilateral lower quadrants). There is no rebound and no guarding.  Musculoskeletal:  No midline spine tenderness, no midline spine step-off, mild right lumbar spine muscular back tenderness  Neurological: She is alert. Gait normal.  Skin: Skin is warm and dry. She is not diaphoretic.     Assessment/Plan: Please see individual problem list.  Hematuria Patient with onset of hematuria and right back and flank pain and right-sided abdominal discomfort 3 days ago. She reports this is similar to her prior kidney stones. On exam she is tender in the abdomen which would be odd for kidney stones. Given this we'll obtain a CT abdomen and pelvis without  contrast to evaluate for specific cause. We will start on Flomax and Vicodin for likely kidney stone. If CT scan reveals otherwise we will alter our treatment regimen. She requests referral to urology as well for further evaluation. This will be placed. She is given return precautions.   Orders Placed This Encounter  Procedures  . Urine Culture  . CT Abdomen Pelvis Wo Contrast    Standing Status:   Future    Number of Occurrences:   1    Standing Expiration Date:   04/12/2018    Order Specific Question:   Reason for Exam (SYMPTOM  OR DIAGNOSIS REQUIRED)    Answer:   abdominal pain, gross hematuria, history of kidney stone    Order Specific Question:   Preferred imaging location?    Answer:   [facility]    Order Specific Question:   Call Results- Best Contact Number?    AnswerAL:[id number]  . Ambulatory referral to Urology    Referral Priority:   Routine    Referral Type:   Consultation    Referral Reason:   Specialty Services Required    Requested Specialty:   Urology    Number of Visits Requested:   1  . POCT Urinalysis Dipstick   Tommi Rumps, MD [place]

## 2017-01-13 NOTE — Telephone Encounter (Signed)
Referral sent to [facility] and ask for her to be scheduled in the next couple days. I will let you know once it gets scheduled.

## 2017-01-13 NOTE — Assessment & Plan Note (Addendum)
Patient with onset of hematuria and right back and flank pain and right-sided abdominal discomfort 3 days ago. She reports this is similar to her prior kidney stones. On exam she is tender in the abdomen which would be odd for kidney stones. Given this we'll obtain a CT abdomen and pelvis without contrast to evaluate for specific cause. We will start on Flomax and Vicodin for likely kidney stone. If CT scan reveals otherwise we will alter our treatment regimen. She requests referral to urology as well for further evaluation. This will be placed. She is given return precautions.

## 2017-01-13 NOTE — Telephone Encounter (Signed)
Please let the patient know that her CT scan revealed several kidney stones in the connection from her right kidney to her bladder and also revealed several kidney stones in her right kidney. These are causing some obstruction on the right side. There were no other acute changes outside of her urinary tract system. We'll get her referred to urology for evaluation. She should fill the prescription for Flomax and Vicodin. If her pain is uncontrolled with the Vicodin I would suggest emergency room evaluation for pain control.

## 2017-01-13 NOTE — Telephone Encounter (Signed)
[facility] called to verify PCP could see CT results in patient chart, results viewable by nurse.

## 2017-01-13 NOTE — Telephone Encounter (Signed)
Patient notified

## 2017-01-13 NOTE — Patient Instructions (Signed)
Nice to see you. We are going to get a CT scan of your abdomen and pelvis to evaluate for kidney stone and for potential other causes. We will treat your pain with Vicodin. Will also treat you with Flomax to try to help you pass the stone. If you worsening pain, fevers, numbness, weakness, loss of bowel or bladder function, nausea, vomiting, diarrhea, or any new or changing symptoms please seek medical attention medially.

## 2017-01-13 NOTE — Telephone Encounter (Signed)
Would you like culture or micro on pt urine?

## 2017-01-13 NOTE — Telephone Encounter (Signed)
Both please. Orders have been placed.

## 2017-01-13 NOTE — Progress Notes (Signed)
Pre visit review using our clinic review tool, if applicable. No additional management support is needed unless otherwise documented below in the visit note. 

## 2017-01-15 LAB — URINE CULTURE

## 2017-01-16 ENCOUNTER — Other Ambulatory Visit: Payer: Self-pay | Admitting: Radiology

## 2017-01-16 ENCOUNTER — Encounter: Payer: Self-pay | Admitting: Urology

## 2017-01-16 ENCOUNTER — Ambulatory Visit (INDEPENDENT_AMBULATORY_CARE_PROVIDER_SITE_OTHER): Payer: Medicare Other | Admitting: Urology

## 2017-01-16 ENCOUNTER — Telehealth: Payer: Self-pay | Admitting: Radiology

## 2017-01-16 VITALS — BP 121/75 | HR 96 | Ht 63.5 in | Wt 169.0 lb

## 2017-01-16 DIAGNOSIS — R319 Hematuria, unspecified: Secondary | ICD-10-CM | POA: Diagnosis not present

## 2017-01-16 DIAGNOSIS — N200 Calculus of kidney: Secondary | ICD-10-CM | POA: Diagnosis not present

## 2017-01-16 LAB — URINALYSIS, COMPLETE
BILIRUBIN UA: NEGATIVE
Glucose, UA: NEGATIVE
Ketones, UA: NEGATIVE
NITRITE UA: NEGATIVE
Specific Gravity, UA: 1.025 (ref 1.005–1.030)
UUROB: 1 mg/dL (ref 0.2–1.0)
pH, UA: 6 (ref 5.0–7.5)

## 2017-01-16 LAB — MICROSCOPIC EXAMINATION: Bacteria, UA: NONE SEEN

## 2017-01-16 NOTE — Progress Notes (Signed)
 01/16/2017 9:04 AM   Caroline Gray 09/15/1946 [id number]  Referring provider: Eric G Sonnenberg, MD [address] [place], [place] [address]  Chief Complaint  Patient presents with  . New Patient (Initial Visit)    renal stone     HPI: The patient is a 70-year-old female with a past medical history of nephrolithiasis presents today for evaluation of 3 obstructive stones in her mid ureter causing right hydroureteronephrosis proximal to this which was diagnosed after presenting to the emergency department with right flank pain. These 3 stones appeared to range between 4 and 5 mm in size.  She also has bilateral nonobstructing stones with the largest on each side being approximately 7.5 mm. Her left kidney does have mild atrophy. Currently, her pain is well-controlled she notes only minor discomfort. She does note intermittent gross hematuria. Over the last day she has developed urgency which is new. She is unsure if she has passed stones. She denies fevers or dysuria. Her symptoms all started approximately 1 week ago.  She has an extensive stone history. All of her stones have been calcium oxalate. Many years ago, she has had open pyelolithotomy on both sides on more than one occasion. She does not feel that she has ever had a PCNL. She has also had multiple ureteroscopy procedures. She has undergone multiple 24-hour urine collections which have been unremarkable.    PMH: Past Medical History:  Diagnosis Date  . Adenomatous colon polyp   . Arthritis   . Cataract   . Diabetes mellitus   . Diverticulosis   . Guillain-Barre (HCC)   . History of kidney stones   . Hx: UTI (urinary tract infection)   . Hyperlipidemia   . Hypertension   . IBS (irritable bowel syndrome)   . Positive PPD   . Psoriasis     Surgical History: Past Surgical History:  Procedure Laterality Date  . ABDOMINAL HYSTERECTOMY    . ABDOMINAL HYSTERECTOMY    . APPENDECTOMY    . CATARACT EXTRACTION    .  CHOLECYSTECTOMY    . LITHOTRIPSY  3.7.13   Dr Cope  . ROTATOR CUFF REPAIR  08/2005  . SPINE SURGERY      Home Medications:  Allergies as of 01/16/2017      Reactions   Codeine    Nausea and vomiting   Influenza Vaccines    Guilliane Barre   Ioxaglate    Shaking and rapid heart beat   Ivp Dye [iodinated Diagnostic Agents]    Shaking and rapid heart beat   Macrodantin    Vomiting and nausea   Nitrofurantoin Other (See Comments)      Medication List       Accurate as of 01/16/17  9:04 AM. Always use your most recent med list.          aspirin 81 MG tablet Take 1 tablet (81 mg total) by mouth daily.   calcipotriene 0.005 % cream Commonly known as:  DOVONOX Apply topically 2 (two) times daily.   CALCIUM CITRATE + D PO Take 1 capsule by mouth 2 (two) times daily.   clobetasol ointment 0.05 % Commonly known as:  TEMOVATE Apply 1 application topically 2 (two) times daily.   DULoxetine 30 MG capsule Commonly known as:  CYMBALTA Take 1 capsule (30 mg total) by mouth daily.   Dupilumab 300 MG/2ML Sosy First dose- injet 600mg (2 syringes) at same time. Then, inject 1 syringe (300mg/2ml) SQ every other week     gabapentin 300 MG capsule Commonly known as:  NEURONTIN Take 1 capsule (300 mg total) by mouth 3 (three) times daily.   glucose blood test strip Test 2 times daily with Accu-Chek monitor   HYDROcodone-acetaminophen 5-325 MG tablet Commonly known as:  NORCO/VICODIN Take 1 tablet by mouth every 6 (six) hours as needed for moderate pain.   Insulin Glargine 100 UNIT/ML Solostar Pen Commonly known as:  LANTUS SOLOSTAR Inject 38 Units into the skin daily at 10 pm.   Insulin Pen Needle 33G X 8 MM Misc 1 application by Does not apply route daily.   metFORMIN 1000 MG tablet Commonly known as:  GLUCOPHAGE Take 1 tablet (1,000 mg total) by mouth 2 (two) times daily with a meal.   simvastatin 40 MG tablet Commonly known as:  ZOCOR Take 1 tablet (40 mg total) by  mouth at bedtime.   tamsulosin 0.4 MG Caps capsule Commonly known as:  FLOMAX Take 1 capsule (0.4 mg total) by mouth daily.       Allergies:  Allergies  Allergen Reactions  . Codeine     Nausea and vomiting  . Influenza Vaccines     Guilliane Barre  . Ioxaglate     Shaking and rapid heart beat  . Ivp Dye [Iodinated Diagnostic Agents]     Shaking and rapid heart beat  . Macrodantin     Vomiting and nausea  . Nitrofurantoin Other (See Comments)    Family History: Family History  Problem Relation Age of Onset  . Diabetes Mother   . Heart attack Paternal Aunt   . Heart attack Paternal Uncle   . Heart attack Paternal Uncle   . Heart attack Paternal Uncle   . Heart attack Paternal Uncle   . Heart attack Paternal Aunt   . Heart attack Paternal Aunt   . Heart attack Paternal Aunt   . Other Son     Ulcerative Proctitis   . Colon cancer Neg Hx   . Colon polyps Neg Hx   . Rectal cancer Neg Hx   . Stomach cancer Neg Hx     Social History:  reports that she quit smoking about 17 years ago. Her smoking use included Cigarettes. She quit after 15.00 years of use. She has never used smokeless tobacco. She reports that she drinks alcohol. She reports that she does not use drugs.  ROS: UROLOGY Frequent Urination?: Yes Hard to postpone urination?: Yes Burning/pain with urination?: No Get up at night to urinate?: Yes Leakage of urine?: Yes Urine stream starts and stops?: No Trouble starting stream?: No Do you have to strain to urinate?: No Blood in urine?: Yes Urinary tract infection?: No Sexually transmitted disease?: No Injury to kidneys or bladder?: No Painful intercourse?: No Weak stream?: No Currently pregnant?: No Vaginal bleeding?: Yes Last menstrual period?: n  Gastrointestinal Nausea?: No Vomiting?: No Indigestion/heartburn?: No Diarrhea?: No Constipation?: No  Constitutional Fever: No Night sweats?: No Weight loss?: No Fatigue?: Yes  Skin Skin  rash/lesions?: Yes Itching?: Yes  Eyes Blurred vision?: Yes Double vision?: No  Ears/Nose/Throat Sore throat?: No  Hematologic/Lymphatic Swollen glands?: No Easy bruising?: No  Cardiovascular Leg swelling?: No Chest pain?: No  Respiratory Cough?: No Shortness of breath?: No  Endocrine Excessive thirst?: No  Musculoskeletal Back pain?: Yes Joint pain?: No  Neurological Headaches?: No Dizziness?: Yes  Psychologic Depression?: No Anxiety?: No  Physical Exam: BP 121/75   Pulse 96   Ht 5' 3.5" (1.613 m)   Wt 169 lb (76.7   kg)   BMI 29.47 kg/m   Constitutional:  Alert and oriented, No acute distress. HEENT: [place] AT, moist mucus membranes.  Trachea midline, no masses. Cardiovascular: No clubbing, cyanosis, or edema. Respiratory: Normal respiratory effort, no increased work of breathing. GI: Abdomen is soft, nontender, nondistended, no abdominal masses GU: No CVA tenderness.  Skin: No rashes, bruises or suspicious lesions. Lymph: No cervical or inguinal adenopathy. Neurologic: Grossly intact, no focal deficits, moving all 4 extremities. Psychiatric: Normal mood and affect.  Laboratory Data: Lab Results  Component Value Date   WBC 6.7 07/25/2016   HGB 14.5 07/25/2016   HCT 42.0 07/25/2016   MCV 95.5 07/25/2016   PLT 153.0 07/25/2016    Lab Results  Component Value Date   CREATININE 0.80 07/25/2016    No results found for: PSA  No results found for: TESTOSTERONE  Lab Results  Component Value Date   HGBA1C 7.0 (H) 11/29/2016    Urinalysis    Component Value Date/Time   BILIRUBINUR moderate 01/13/2017 0937   PROTEINUR >=300 01/13/2017 0937   UROBILINOGEN 1.0 01/13/2017 0937   NITRITE negative 01/13/2017 0937   LEUKOCYTESUR small (1+) (A) 01/13/2017 0937    Pertinent Imaging: CLINICAL DATA:  Right flank pain, gross hematuria for 3 days, history of kidney stones  EXAM: CT ABDOMEN AND PELVIS WITHOUT CONTRAST  TECHNIQUE: Multidetector CT  imaging of the abdomen and pelvis was performed following the standard protocol without IV contrast.  COMPARISON:  02/25/2014  FINDINGS: Lower chest: Lung bases shows no acute findings. Stable scarring left base laterally. Stable calcified granuloma left lower lobe posteriorly measures 8 mm.  Hepatobiliary: Unenhanced liver shows no biliary ductal dilatation. Status postcholecystectomy. Again noted nodular contour of the liver consistent with cirrhosis.  Pancreas: Unenhanced pancreas shows no acute abnormality. No peripancreatic inflammation.  Spleen: Again noted multiple punctate calcifications within spleen consistent with prior granulomatous disease. No focal splenic abnormality.  Adrenals/Urinary Tract: No adrenal gland mass. There is mild to moderate right hydronephrosis and proximal right hydroureter. At least 2 or 3 nonobstructive calcified calculi are noted within lower pole of the right kidney the largest measures 7.8 mm. Coarse nonobstructive calcified calculus in lower pole of the left kidney measures 7.5 mm.  Axial image 61 at least 3 obstructive calculi are noted in mid right ureter in linear distribution about L5/S1 disc level the largest in axial image 62 measures 5.7 mm. There is a 5 mm calculus just above the largest at the same level. A 3rd calculus just inferior to the largest axial image 63 measures 4.5 mm. These are best seen in sagittal image 69.  There is mild proximal right periureteral stranding. There is cortical scarring in lower pole of the left kidney. Cortical scarring in midpole of the left kidney.  Stomach/Bowel: No small bowel obstruction. Moderate stool noted throughout the colon. No gastric outlet obstruction. Question normal appendix or appendix stump axial image 63. Terminal ileum is unremarkable. Scattered diverticula are noted descending colon. Multiple sigmoid colon diverticula. Moderate stool noted within sigmoid colon and  rectum. No definite evidence of acute colitis or diverticulitis.  Vascular/Lymphatic: Atherosclerotic calcifications of abdominal aorta and iliac arteries are noted. No adenopathy.  Reproductive: The patient is status post hysterectomy  Other: No ascites or free abdominal air. Small umbilical hernia containing fat without evidence of acute complication.  Musculoskeletal: No destructive bony lesions are noted. Mild degenerative changes lumbar spine.  IMPRESSION: 1. There is mild to moderate right hydronephrosis and proximal right hydroureter. Mild   right proximal periureteral stranding.Axial image 61 at least 3 obstructive calculi are noted in mid right ureter in linear distribution about L5/S1 disc level the largest in axial image 62 measures 5.7 mm. There is a 5 mm calculus just above the largest at the same level. A 3rd calculus just inferior to the largest axial image 63 measures 4.5 mm. These are best seen in sagittal image 69. 2. Bilateral nonobstructive nephrolithiasis. Cortical scarring in mid and lower pole of the left kidney. 3. Again noted nodular contour of the liver consistent with cirrhosis. Status post cholecystectomy. 4. Scattered diverticula are noted transverse colon distally and descending colon. No evidence of acute diverticulitis. Moderate stool noted throughout the colon. Multiple sigmoid colon diverticula. No evidence of distal acute diverticulitis or colitis. Moderate stool noted in rectosigmoid colon. 5. No small bowel obstruction. 6. Atherosclerotic calcifications of abdominal aorta and iliac arteries.  Assessment & Plan:    1. Multiple right ureteral calculi 2. Bilateral nonobstructing calculi 3. Gross hematuria I discussed treatment options with the patient including medical expulsive therapy and ureteroscopy. I do not think lithotripsy would be a good option for her given her multiple stones. At this point, given her nonobstructing stones that  are large in size, the patient elected to undergo cystoscopy, bilateral ureteroscopy, laser lithotripsy, bilateral ureteral stent placement. She understands the need for ureteral stents, need for possible repeat procedure, risk of iatrogenic injury, bleeding, infection, and inability to clear all stones. She understands these risks and would like to proceed with surgery.   Floyd Lusignan James Abbas Beyene, MD  [facility] [address], [address] ,  [address] [phone]  

## 2017-01-16 NOTE — Telephone Encounter (Signed)
Notified pt of surgery with Dr Pilar Jarvis on 01/22/17, pre-admit testing appt on 01/17/17 @9 :00 & to call day prior to surgery for arrival time to SDS. Advised pt to hold ASA 81mg  beginning immediately per Dr Pilar Jarvis. Pt voices understanding.

## 2017-01-17 ENCOUNTER — Encounter
Admission: RE | Admit: 2017-01-17 | Discharge: 2017-01-17 | Disposition: A | Discharge status: Home / Self Care | Payer: Medicare Other | Source: Ambulatory Visit | Attending: Urology | Admitting: Urology

## 2017-01-17 DIAGNOSIS — I10 Essential (primary) hypertension: Secondary | ICD-10-CM | POA: Diagnosis not present

## 2017-01-17 DIAGNOSIS — Z01818 Encounter for other preprocedural examination: Secondary | ICD-10-CM | POA: Diagnosis not present

## 2017-01-17 HISTORY — DX: Anxiety disorder, unspecified: F41.9

## 2017-01-17 LAB — CBC
HEMATOCRIT: 40.7 % (ref 35.0–47.0)
HEMOGLOBIN: 13.5 g/dL (ref 12.0–16.0)
MCH: 32.3 pg (ref 26.0–34.0)
MCHC: 33.1 g/dL (ref 32.0–36.0)
MCV: 97.6 fL (ref 80.0–100.0)
PLATELETS: 146 10*3/uL — AB (ref 150–440)
RBC: 4.17 MIL/uL (ref 3.80–5.20)
RDW: 13.3 % (ref 11.5–14.5)
WBC: 6.4 10*3/uL (ref 3.6–11.0)

## 2017-01-17 LAB — BASIC METABOLIC PANEL
ANION GAP: 10 (ref 5–15)
BUN: 11 mg/dL (ref 6–20)
CHLORIDE: 100 mmol/L — AB (ref 101–111)
CO2: 29 mmol/L (ref 22–32)
Calcium: 9.4 mg/dL (ref 8.9–10.3)
Creatinine, Ser: 0.64 mg/dL (ref 0.44–1.00)
GFR calc non Af Amer: >60 mL/min (ref >60)
Glucose, Bld: 225 mg/dL — ABNORMAL HIGH (ref 65–99)
POTASSIUM: 3.7 mmol/L (ref 3.5–5.1)
Sodium: 139 mmol/L (ref 135–145)

## 2017-01-17 NOTE — Patient Instructions (Signed)
  Your procedure is scheduled on: January 22, 2017 [facility]) Report to Same Day Surgery 2nd floor medical mall ([facility] Entrance-take elevator on left to 2nd floor.  Check in with surgery information desk.) To find out your arrival time please call [phone] between 1PM - 3PM on January 21, 2017 (TUESDAY)  Remember: Instructions that are not followed completely may result in serious medical risk, up to and including death, or upon the discretion of your surgeon and anesthesiologist your surgery may need to be rescheduled.    _x___ 1. Do not eat food or drink liquids after midnight. No gum chewing or hard candies.     __x__ 2. No Alcohol for 24 hours before or after surgery.   __x__3. No Smoking for 24 prior to surgery.   ____  4. Bring all medications with you on the day of surgery if instructed.    __x__ 5. Notify your doctor if there is any change in your medical condition     (cold, fever, infections).     Do not wear jewelry, make-up, hairpins, clips or nail polish.  Do not wear lotions, powders, or perfumes. You may wear deodorant.  Do not shave 48 hours prior to surgery. Men may shave face and neck.  Do not bring valuables to the hospital.    [facility] is not responsible for any belongings or valuables.               Contacts, dentures or bridgework may not be worn into surgery.  Leave your suitcase in the car. After surgery it may be brought to your room.  For patients admitted to the hospital, discharge time is determined by your treatment team.   Patients discharged the day of surgery will not be allowed to drive home.  You will need someone to drive you home and stay with you the night of your procedure.    Please read over the following fact sheets that you were given:   [facility] Preparing for Surgery and or MRSA Information   _x___ Take these medicines the morning of surgery with A SIP OF WATER:    1. GABAPENTIN  2.  3.  4.  5.  6.  ____Fleets enema or  Magnesium Citrate as directed.   __ Use CHG Soap or sage wipes as directed on instruction sheet   ____ Use inhalers on the day of surgery and bring to hospital day of surgery  __x__ Stop metformin 2 days prior to surgery ( STOP METFORMIN ON MARCH 5)    __x__ Take 1/2 of usual insulin dose the night before surgery and none on the morning of surgery (TAKE-ONE HALF DOSE OF LANTUS AT BEDTIME ON TUESDAY  NIGHT)           __x__ Stop Aspirin, Coumadin, Pllavix ,Eliquis, Effient, or Pradaxa (STOP ASPIRIN TODAY)   x__ Stop Anti-inflammatories such as Advil, Aleve, Ibuprofen, Motrin, Naproxen,          Naprosyn, Goodies powders or aspirin products. Ok to take Tylenol.   ____ Stop supplements until after surgery.    ____ Bring C-Pap to the hospital.

## 2017-01-18 LAB — URINE CULTURE

## 2017-01-20 ENCOUNTER — Telehealth: Payer: Self-pay | Admitting: *Deleted

## 2017-01-20 NOTE — Pre-Procedure Instructions (Signed)
MEDICAL CLEARANCE REQUEST/EKG,AS INSTRUCTED BY DR Donata Duff, CALLED AND FAXED TO [place]

## 2017-01-20 NOTE — Pre-Procedure Instructions (Signed)
EKG given to [facility] to review for anesthesia.

## 2017-01-20 NOTE — Telephone Encounter (Signed)
I received the EKG. I would recommend an in office visit for surgical clearance. We can see her this week in any 30 minute slot or if needed a 15 minute slot.

## 2017-01-20 NOTE — Telephone Encounter (Signed)
Left message to return call 

## 2017-01-20 NOTE — Telephone Encounter (Signed)
[place] [facility] has requested to know if the medical clarence form was received in reference to a pre-op EKG.  Contact  Amy from [place]

## 2017-01-20 NOTE — Telephone Encounter (Signed)
Please advise 

## 2017-01-21 ENCOUNTER — Encounter: Payer: Self-pay | Admitting: Family Medicine

## 2017-01-21 ENCOUNTER — Ambulatory Visit (INDEPENDENT_AMBULATORY_CARE_PROVIDER_SITE_OTHER): Payer: Medicare Other | Admitting: Family Medicine

## 2017-01-21 DIAGNOSIS — R319 Hematuria, unspecified: Secondary | ICD-10-CM | POA: Diagnosis not present

## 2017-01-21 DIAGNOSIS — F3342 Major depressive disorder, recurrent, in full remission: Secondary | ICD-10-CM

## 2017-01-21 DIAGNOSIS — Z01818 Encounter for other preprocedural examination: Secondary | ICD-10-CM | POA: Insufficient documentation

## 2017-01-21 MED ORDER — CEFAZOLIN SODIUM-DEXTROSE 2-4 GM/100ML-% IV SOLN
2.0000 g | INTRAVENOUS | Status: AC
  Administered 2017-01-22: 2 g via INTRAVENOUS

## 2017-01-21 NOTE — Telephone Encounter (Signed)
Patient is scheduled   

## 2017-01-21 NOTE — Assessment & Plan Note (Signed)
Found to have kidney stones. She is due for procedure tomorrow for this. Preop exam completed today.

## 2017-01-21 NOTE — Assessment & Plan Note (Signed)
Doing well currently with Cymbalta. Discussed continuing to monitor on this.

## 2017-01-21 NOTE — Assessment & Plan Note (Signed)
Preop exam performed. Patient is low risk for cardiac complications given [place] Safe perioperative risk score of 0.1%. She is average risk for complications given her age and her diabetes history. She is optimized for surgery with well-controlled diabetes currently with most recent A1c of 7.0. We will fax this note to her urologist.

## 2017-01-21 NOTE — Progress Notes (Signed)
Pre visit review using our clinic review tool, if applicable. No additional management support is needed unless otherwise documented below in the visit note. 

## 2017-01-21 NOTE — Patient Instructions (Signed)
Nice to see you. We will fax to days note to your urologists. You should be okay to proceed with surgery.

## 2017-01-21 NOTE — Progress Notes (Signed)
  Tommi Rumps, MD Phone: [phone]  Caroline Gray is a 71 y.o. female who presents today for preop visit.  Patient is due to have cystoscopy, bilateral ureteroscopy with laser lithotripsy and bilateral ureteral stent placement. She notes no chest pain or breathlessness with climbing 2 flights of stairs. She notes no palpitations. EKG reviewed with PACs and no ischemic changes. She notes no kidney disease. No issues with anesthesia in her or her family. No history of heart attack. No history of stroke. She had a stress test in July 2017 that was low risk. She notes no history of epilepsy or seizures. No thyroid disease. No angina. No liver disease. No heart failure. No asthma or bronchitis. She does have diabetes and does require insulin though most recent A1c was well controlled at 7.0. 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 She has had no recurrent issues with this. She notes her kidney stone symptoms have eased off to some degree. She notes some residual discomfort though no urgency.  Depression: Patient notes she's done well on Cymbalta. Has been very beneficial. She notes no depression. No SI.  PMH: Former smoker   ROS see history of present illness  Objective  Physical Exam Vitals:   01/21/17 1025  BP: 130/66  Pulse: 99  Temp: 98.5 F (36.9 C)    BP Readings from Last 3 Encounters:  01/21/17 130/66  01/17/17 (!) 128/57  01/16/17 121/75   Wt Readings from Last 3 Encounters:  01/21/17 168 lb 6.4 oz (76.4 kg)  01/17/17 169 lb (76.7 kg)  01/16/17 169 lb (76.7 kg)    Physical Exam  Constitutional: She is well-developed, well-nourished, and in no distress.  HENT:  Head: Normocephalic and atraumatic.  Mouth/Throat: Oropharynx is clear and moist. No oropharyngeal exudate.  Eyes: Conjunctivae are normal. Pupils are equal, round, and reactive to light.  Cardiovascular: Normal rate, regular rhythm and normal heart sounds.     Pulmonary/Chest: Effort normal and breath sounds normal.  Abdominal: Soft. Bowel sounds are normal. She exhibits no distension. There is no tenderness. There is no rebound and no guarding.  Musculoskeletal: She exhibits no edema.  Neurological: She is alert. Gait normal.  Skin: Skin is warm and dry. She is not diaphoretic.   Preop EKG reviewed. Appears to be normal sinus rhythm with occasional PACs. No ischemic changes.  Assessment/Plan: Please see individual problem list.  Preop examination Preop exam performed. Patient is low risk for cardiac complications given Lyndel Safe perioperative risk score of 0.1%. She is average risk for complications given her age and her diabetes history. She is optimized for surgery with well-controlled diabetes currently with most recent A1c of 7.0. We will fax this note to her urologist.   Depression Doing well currently with Cymbalta. Discussed continuing to monitor on this.  Hematuria Found to have kidney stones. She is due for procedure tomorrow for this. Preop exam completed today.   Tommi Rumps, MD [facility]

## 2017-01-21 NOTE — Pre-Procedure Instructions (Signed)
CLEARED  BY DR Caryl Bis

## 2017-01-22 ENCOUNTER — Encounter: Payer: Self-pay | Admitting: *Deleted

## 2017-01-22 ENCOUNTER — Ambulatory Visit: Payer: Medicare Other | Admitting: Anesthesiology

## 2017-01-22 ENCOUNTER — Ambulatory Visit
Admission: RE | Admit: 2017-01-22 | Discharge: 2017-01-22 | Disposition: A | Discharge status: Home / Self Care | Payer: Medicare Other | Source: Ambulatory Visit | Attending: Urology | Admitting: Urology

## 2017-01-22 ENCOUNTER — Encounter
Admission: RE | Disposition: A | Discharge status: Home / Self Care | Payer: Self-pay | Source: Ambulatory Visit | Attending: Urology

## 2017-01-22 DIAGNOSIS — E785 Hyperlipidemia, unspecified: Secondary | ICD-10-CM | POA: Diagnosis not present

## 2017-01-22 DIAGNOSIS — F419 Anxiety disorder, unspecified: Secondary | ICD-10-CM | POA: Insufficient documentation

## 2017-01-22 DIAGNOSIS — E119 Type 2 diabetes mellitus without complications: Secondary | ICD-10-CM | POA: Diagnosis not present

## 2017-01-22 DIAGNOSIS — N200 Calculus of kidney: Secondary | ICD-10-CM

## 2017-01-22 DIAGNOSIS — Z79899 Other long term (current) drug therapy: Secondary | ICD-10-CM | POA: Insufficient documentation

## 2017-01-22 DIAGNOSIS — N132 Hydronephrosis with renal and ureteral calculous obstruction: Secondary | ICD-10-CM | POA: Diagnosis not present

## 2017-01-22 DIAGNOSIS — N202 Calculus of kidney with calculus of ureter: Secondary | ICD-10-CM | POA: Diagnosis not present

## 2017-01-22 DIAGNOSIS — Z794 Long term (current) use of insulin: Secondary | ICD-10-CM | POA: Diagnosis not present

## 2017-01-22 DIAGNOSIS — F329 Major depressive disorder, single episode, unspecified: Secondary | ICD-10-CM | POA: Insufficient documentation

## 2017-01-22 DIAGNOSIS — Z87891 Personal history of nicotine dependence: Secondary | ICD-10-CM | POA: Insufficient documentation

## 2017-01-22 DIAGNOSIS — I10 Essential (primary) hypertension: Secondary | ICD-10-CM | POA: Insufficient documentation

## 2017-01-22 DIAGNOSIS — Z7982 Long term (current) use of aspirin: Secondary | ICD-10-CM | POA: Insufficient documentation

## 2017-01-22 HISTORY — PX: URETEROSCOPY WITH HOLMIUM LASER LITHOTRIPSY: SHX6645

## 2017-01-22 HISTORY — PX: CYSTOSCOPY WITH STENT PLACEMENT: SHX5790

## 2017-01-22 LAB — GLUCOSE, CAPILLARY
Glucose-Capillary: 112 mg/dL — ABNORMAL HIGH (ref 65–99)
Glucose-Capillary: 108 mg/dL — ABNORMAL HIGH (ref 65–99)

## 2017-01-22 SURGERY — URETEROSCOPY, WITH LITHOTRIPSY USING HOLMIUM LASER
Anesthesia: General | Laterality: Bilateral | Wound class: Clean Contaminated

## 2017-01-22 MED ORDER — PROPOFOL 10 MG/ML IV BOLUS
INTRAVENOUS | Status: AC
  Filled 2017-01-22: qty 20

## 2017-01-22 MED ORDER — PROMETHAZINE HCL 25 MG/ML IJ SOLN: 6.2500 mg | INTRAMUSCULAR | Status: DC | PRN

## 2017-01-22 MED ORDER — MIDAZOLAM HCL 2 MG/2ML IJ SOLN
INTRAMUSCULAR | Status: AC
  Filled 2017-01-22: qty 2

## 2017-01-22 MED ORDER — SUCCINYLCHOLINE CHLORIDE 20 MG/ML IJ SOLN
INTRAMUSCULAR | Status: DC | PRN
  Administered 2017-01-22: 100 mg via INTRAVENOUS

## 2017-01-22 MED ORDER — FAMOTIDINE 20 MG PO TABS
20.0000 mg | ORAL_TABLET | Freq: Once | ORAL | Status: AC
  Administered 2017-01-22: 20 mg via ORAL

## 2017-01-22 MED ORDER — MIDAZOLAM HCL 2 MG/2ML IJ SOLN
INTRAMUSCULAR | Status: DC | PRN
  Administered 2017-01-22: 2 mg via INTRAVENOUS

## 2017-01-22 MED ORDER — ONDANSETRON HCL 4 MG/2ML IJ SOLN
INTRAMUSCULAR | Status: AC
  Filled 2017-01-22: qty 2

## 2017-01-22 MED ORDER — FENTANYL CITRATE (PF) 100 MCG/2ML IJ SOLN
INTRAMUSCULAR | Status: AC
  Administered 2017-01-22: 25 ug via INTRAVENOUS
  Filled 2017-01-22: qty 2

## 2017-01-22 MED ORDER — CEFAZOLIN SODIUM-DEXTROSE 2-4 GM/100ML-% IV SOLN
INTRAVENOUS | Status: AC
  Filled 2017-01-22: qty 100

## 2017-01-22 MED ORDER — ONDANSETRON HCL 4 MG/2ML IJ SOLN
INTRAMUSCULAR | Status: DC | PRN
  Administered 2017-01-22: 4 mg via INTRAVENOUS

## 2017-01-22 MED ORDER — FENTANYL CITRATE (PF) 100 MCG/2ML IJ SOLN
25.0000 ug | INTRAMUSCULAR | Status: DC | PRN
  Administered 2017-01-22: 25 ug via INTRAVENOUS
  Administered 2017-01-22: 50 ug via INTRAVENOUS
  Administered 2017-01-22: 25 ug via INTRAVENOUS

## 2017-01-22 MED ORDER — IOTHALAMATE MEGLUMINE 43 % IV SOLN
INTRAVENOUS | Status: DC | PRN
  Administered 2017-01-22: 30 mL
  Administered 2017-01-22: 50 mL

## 2017-01-22 MED ORDER — FENTANYL CITRATE (PF) 100 MCG/2ML IJ SOLN
INTRAMUSCULAR | Status: DC | PRN
  Administered 2017-01-22 (×2): 50 ug via INTRAVENOUS

## 2017-01-22 MED ORDER — PROPOFOL 10 MG/ML IV BOLUS
INTRAVENOUS | Status: DC | PRN
  Administered 2017-01-22: 150 mg via INTRAVENOUS

## 2017-01-22 MED ORDER — LIDOCAINE HCL (CARDIAC) 20 MG/ML IV SOLN
INTRAVENOUS | Status: DC | PRN
  Administered 2017-01-22: 80 mg via INTRAVENOUS

## 2017-01-22 MED ORDER — LIDOCAINE HCL (PF) 2 % IJ SOLN
INTRAMUSCULAR | Status: AC
  Filled 2017-01-22: qty 2

## 2017-01-22 MED ORDER — PHENYLEPHRINE HCL 10 MG/ML IJ SOLN
INTRAMUSCULAR | Status: DC | PRN
  Administered 2017-01-22 (×2): 100 ug via INTRAVENOUS

## 2017-01-22 MED ORDER — FENTANYL CITRATE (PF) 100 MCG/2ML IJ SOLN
INTRAMUSCULAR | Status: AC
  Filled 2017-01-22: qty 2

## 2017-01-22 MED ORDER — HYDROCODONE-ACETAMINOPHEN 5-325 MG PO TABS: 1.0000 | ORAL_TABLET | ORAL | 0 refills | Status: DC | PRN

## 2017-01-22 MED ORDER — SUGAMMADEX SODIUM 500 MG/5ML IV SOLN
INTRAVENOUS | Status: DC | PRN
  Administered 2017-01-22: 150 mg via INTRAVENOUS

## 2017-01-22 MED ORDER — CEPHALEXIN 500 MG PO CAPS: 500.0000 mg | ORAL_CAPSULE | Freq: Three times a day (TID) | ORAL | 0 refills | Status: DC

## 2017-01-22 MED ORDER — SODIUM CHLORIDE 0.9 % IV SOLN
INTRAVENOUS | Status: DC
  Administered 2017-01-22: 07:00:00 via INTRAVENOUS

## 2017-01-22 MED ORDER — SUGAMMADEX SODIUM 200 MG/2ML IV SOLN
INTRAVENOUS | Status: AC
  Filled 2017-01-22: qty 2

## 2017-01-22 MED ORDER — MEPERIDINE HCL 50 MG/ML IJ SOLN: 6.2500 mg | INTRAMUSCULAR | Status: DC | PRN

## 2017-01-22 MED ORDER — ROCURONIUM BROMIDE 100 MG/10ML IV SOLN
INTRAVENOUS | Status: DC | PRN
  Administered 2017-01-22: 20 mg via INTRAVENOUS

## 2017-01-22 MED ORDER — FAMOTIDINE 20 MG PO TABS
ORAL_TABLET | ORAL | Status: AC
  Filled 2017-01-22: qty 1

## 2017-01-22 SURGICAL SUPPLY — 29 items
BACTOSHIELD CHG 4% 4OZ (MISCELLANEOUS) ×2
BASKET ZERO TIP 1.9FR (BASKET) ×3 IMPLANT
CATH URETL 5X70 OPEN END (CATHETERS) ×3 IMPLANT
CNTNR SPEC 2.5X3XGRAD LEK (MISCELLANEOUS) ×1
CONT SPEC 4OZ STER OR WHT (MISCELLANEOUS) ×2
CONTAINER SPEC 2.5X3XGRAD LEK (MISCELLANEOUS) ×1 IMPLANT
FIBER LASER LITHO 273 (Laser) IMPLANT
GLOVE BIO SURGEON STRL SZ7 (GLOVE) ×3 IMPLANT
GLOVE BIO SURGEON STRL SZ7.5 (GLOVE) ×3 IMPLANT
GOWN STRL REUS W/ TWL LRG LVL4 (GOWN DISPOSABLE) ×1 IMPLANT
GOWN STRL REUS W/TWL LRG LVL4 (GOWN DISPOSABLE) ×2
GOWN STRL REUS W/TWL XL LVL3 (GOWN DISPOSABLE) ×3 IMPLANT
GUIDEWIRE SUPER STIFF (WIRE) IMPLANT
INTRODUCER DILATOR DOUBLE (INTRODUCER) ×3 IMPLANT
KIT RM TURNOVER CYSTO AR (KITS) ×3 IMPLANT
PACK CYSTO AR (MISCELLANEOUS) ×3 IMPLANT
SCRUB CHG 4% DYNA-HEX 4OZ (MISCELLANEOUS) ×1 IMPLANT
SENSORWIRE 0.038 NOT ANGLED (WIRE) ×6
SET CYSTO W/LG BORE CLAMP LF (SET/KITS/TRAYS/PACK) ×3 IMPLANT
SHEATH URETERAL 13/15X36 1L (SHEATH) IMPLANT
SHEATH URETL 1L 13/15X28 (SHEATH) ×3 IMPLANT
SOL .9 NS 3000ML IRR  AL (IV SOLUTION) ×2
SOL .9 NS 3000ML IRR UROMATIC (IV SOLUTION) ×1 IMPLANT
STENT URET 6FRX24 CONTOUR (STENTS) ×6 IMPLANT
STENT URET 6FRX26 CONTOUR (STENTS) IMPLANT
SURGILUBE 2OZ TUBE FLIPTOP (MISCELLANEOUS) ×3 IMPLANT
SYRINGE IRR TOOMEY STRL 70CC (SYRINGE) ×3 IMPLANT
WATER STERILE IRR 1000ML POUR (IV SOLUTION) ×3 IMPLANT
WIRE SENSOR 0.038 NOT ANGLED (WIRE) ×2 IMPLANT

## 2017-01-22 NOTE — Anesthesia Postprocedure Evaluation (Signed)
Anesthesia Post Note  Patient: Caroline Gray  Procedure(s) Performed: Procedure(s) (LRB): URETEROSCOPY WITH HOLMIUM LASER LITHOTRIPSY (Bilateral) CYSTOSCOPY WITH STENT PLACEMENT (Bilateral)  Patient location during evaluation: PACU Anesthesia Type: General Level of consciousness: awake and alert and oriented Pain management: pain level controlled Vital Signs Assessment: post-procedure vital signs reviewed and stable Respiratory status: spontaneous breathing, nonlabored ventilation and respiratory function stable Cardiovascular status: blood pressure returned to baseline and stable Postop Assessment: no signs of nausea or vomiting Anesthetic complications: no     Last Vitals:  Vitals:   01/22/17 0906 01/22/17 0911  BP:  136/66  Pulse: 83 85  Resp: 16 13  Temp:      Last Pain:  Vitals:   01/22/17 0911  TempSrc:   PainSc: 6                  Kayleb Warshaw

## 2017-01-22 NOTE — Anesthesia Procedure Notes (Signed)
Procedure Name: Intubation Date/Time: 01/22/2017 7:58 AM Performed by: Aline Brochure Pre-anesthesia Checklist: Patient identified, Emergency Drugs available, Suction available and Patient being monitored Patient Re-evaluated:Patient Re-evaluated prior to inductionOxygen Delivery Method: Circle system utilized Preoxygenation: Pre-oxygenation with 100% oxygen Intubation Type: IV induction Ventilation: Mask ventilation without difficulty Laryngoscope Size: Mac and 3 Grade View: Grade II Tube type: Oral Tube size: 7.0 mm Number of attempts: 2 Airway Equipment and Method: Bougie stylet Placement Confirmation: positive ETCO2 and breath sounds checked- equal and bilateral Secured at: 22 cm Tube secured with: Tape Dental Injury: Teeth and Oropharynx as per pre-operative assessment

## 2017-01-22 NOTE — Op Note (Signed)
Date of procedure: 01/22/17  Preoperative diagnosis:  1. Right ureteral calculi 2. Bilateral nonobstructive nephrolithiasis   Postoperative diagnosis:  1. Passed right ureteral calculi 2. Right nonobstructive nephrolithiasis   Procedure: 1. Cystoscopy 2. Bilateral ureteroscopy 3. Stone basketing 4. Bilateral retrograde pyelogram with interpretation 5. Bilateral ureteral stent placement 6 French by 24 cm  Surgeon: Baruch Gouty, MD  Anesthesia: General  Complications: None  Intraoperative findings: Upon attempt to plathe cystoscop with a varicedure, there is to stones noted within the patient's distal vagina that she recently passed possibly during anesthetic induction. Right retrograde pyelogram showed no further stones within the right ureter. Pan nephroscopy on the right revealed multiple less than 1 mm stone fragments as well as a few larger ones that were able to be stone basket. Left pan nephroscopy revealed no stones visible. Left retrograde pyelogram confirmed this.  EBL: None  Specimens: Right ureteral stone to pathology  Drains: Bilateral 6 French 24 cm double-J ureteral stents  Disposition: Stable to the postanesthesia care unit  Indication for procedure: The patient is a 71 y.o. female with An extensive history of recurrent nephrolithiasis status post multiple interventions she was diagnosed with 2-3 right ureteral calculi as well as apparent bilateral nonobstructing renal stones up to 7.5 mm in size. She presents today for definitive stone management.  After reviewing the management options for treatment, the patient elected to proceed with the above surgical procedure(s). We have discussed the potential benefits and risks of the procedure, side effects of the proposed treatment, the likelihood of the patient achieving the goals of the procedure, and any potential problems that might occur during the procedure or recuperation. Informed consent has been  obtained.  Description of procedure: The patient was met in the preoperative area. All risks, benefits, and indications of the procedure were described in great detail. The patient consented to the procedure. Preoperative antibiotics were given. The patient was taken to the operative theater. General anesthesia was induced per the anesthesia service. The patient was then placed in the dorsal lithotomy position and prepped and draped in the usual sterile fashion. A preoperative timeout was called.   At the begin of the procedure, when the patient's urethra was visualized There was noted to be two stones within the patient's distal vagina that apparently had just passed possibly during anesthetic induction. The largest was greater than 5 mm. These were sent to pathology. The 21 French 30 cystoscope was inserto into the patient's urethra atraumatically. Her right ureteral orificce had erythematous appearance consistent with recent stone passage. A right retrograde pyelogram was obtained showed no filling defects within the ureter or hydroureteronephrosis at this time to suggest that all stones have passed. Two sensor wires were then placed to the level of the renal pelvis under fluoroscopy. Over one of the sensor wires a ureteral access sheath was placed under fluoroscopy without difficulty atraumatically. Pan nephroscopy revealed a pile of small stones less than 1 mm which was likely the apparent larger stone on CT scan. Attempt was made to grab the stones with the stone basket but only 1 or 2 were able to be actually stone basketed due to the small size. The stones were too small to perform laser lithotripsy. The thought was that the stirring them up would lead to eventual passage. There weres no stones larger than 1 mm encountered.  All calyces were investigate with the aid of a retrograde pyelogram. It was determined at this time that any residual stones seen on CT must  be calcifications in the submucosa of  the kidney. The ureteral access sheath was removed under direct visualization showing no further stones or trauma. Cystoscope was then reassembled over the remaining sensor wire and a 6 [place] by 26 cm double-J ureteral stent was placed. The sensor wire was removed. A curl was seen in the renal pelvis under fluoroscopy and in the urinary bladder under direct visualization. At this point attention was turned to the left ureteral orifice. Two sensor wires were placed as done on the contralateral side. An attempt was made to place a ureteral access sheath however this did not easily advance passed the distal ureter so this was aborted. A flexible ureteroscope was placed directly over one of the sensor wires and easily passed into the left renal pelvis. Pan nephroscopy was then performed with the aid of a retrograde pyelogram to ensure every calyceal was investigated. No residual stone was seen on the left side. The calcifications seen on the CT scan were determined to be submucosal at this point. She had no evidence of stones on the left side even with retrograde pyelogram guided nephroscopy. Ureteroscope was then removed under direct visualization showing no significant trauma or nephrolithiasis. A left ureteral stent was then placed in a similar fashion as the contralateral side. The patient's bladder was drained. She was awoken and transferred in stable condition to the post anesthesia care unit.  Plan: She will follow-up in one week for ureteral stent removal. She will need an ultrasound in 1 month to rule out iatrogenic hydronephrosis. She has had multiple 24-hour urine studies which did not show a correctable metabolic defect, so there is likely little utility in repeating this in the future.  Baruch Gouty, M.D.

## 2017-01-22 NOTE — Interval H&P Note (Signed)
History and Physical Interval Note:  01/22/2017 7:29 AM  Caroline Gray  has presented today for surgery, with the diagnosis of KIDNEYS STONES  The various methods of treatment have been discussed with the patient and family. After consideration of risks, benefits and other options for treatment, the patient has consented to  Procedure(s): URETEROSCOPY WITH HOLMIUM LASER LITHOTRIPSY (Bilateral) CYSTOSCOPY WITH STENT PLACEMENT (Bilateral) as a surgical intervention .  The patient's history has been reviewed, patient examined, no change in status, stable for surgery.  I have reviewed the patient's chart and labs.  Questions were answered to the patient's satisfaction.    RRR Lungs clear   Nickie Retort

## 2017-01-22 NOTE — Progress Notes (Signed)
No dresing   Voided x2

## 2017-01-22 NOTE — H&P (View-Only) (Signed)
01/16/2017 9:04 AM   Walker Shadow 10-24-1946 [id number]  Referring provider: Leone Haven, MD [address], [place] [address]  Chief Complaint  Patient presents with  . New Patient (Initial Visit)    renal stone     HPI: The patient is a 71 year old female with a past medical history of nephrolithiasis presents today for evaluation of 3 obstructive stones in her mid ureter causing right hydroureteronephrosis proximal to this which was diagnosed after presenting to the emergency department with right flank pain. These 3 stones appeared to range between 4 and 5 mm in size.  She also has bilateral nonobstructing stones with the largest on each side being approximately 7.5 mm. Her left kidney does have mild atrophy. Currently, her pain is well-controlled she notes only minor discomfort. She does note intermittent gross hematuria. Over the last day she has developed urgency which is new. She is unsure if she has passed stones. She denies fevers or dysuria. Her symptoms all started approximately 1 week ago.  She has an extensive stone history. All of her stones have been calcium oxalate. Many years ago, she has had open pyelolithotomy on both sides on more than one occasion. She does not feel that she has ever had a PCNL. She has also had multiple ureteroscopy procedures. She has undergone multiple 24-hour urine collections which have been unremarkable.    PMH: Past Medical History:  Diagnosis Date  . Adenomatous colon polyp   . Arthritis   . Cataract   . Diabetes mellitus   . Diverticulosis   . 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 History of kidney stones   . Hx: UTI (urinary tract infection)   . Hyperlipidemia   . Hypertension   . IBS (irritable bowel syndrome)   . Positive PPD   . Psoriasis     Surgical History: Past Surgical History:  Procedure Laterality Date  . ABDOMINAL HYSTERECTOMY    . ABDOMINAL HYSTERECTOMY    . APPENDECTOMY    . CATARACT EXTRACTION    .  CHOLECYSTECTOMY    . LITHOTRIPSY  3.7.13   Dr Jacqlyn Larsen  . ROTATOR CUFF REPAIR  08/2005  . SPINE SURGERY      Home Medications:  Allergies as of 01/16/2017      Reactions   Codeine    Nausea and vomiting   Influenza Vaccines    Guilliane Barre   Ioxaglate    Shaking and rapid heart beat   Ivp Dye [iodinated Diagnostic Agents]    Shaking and rapid heart beat   Macrodantin    Vomiting and nausea   Nitrofurantoin Other (See Comments)      Medication List       Accurate as of 01/16/17  9:04 AM. Always use your most recent med list.          aspirin 81 MG tablet Take 1 tablet (81 mg total) by mouth daily.   calcipotriene 0.005 % cream Commonly known as:  DOVONOX Apply topically 2 (two) times daily.   CALCIUM CITRATE + D PO Take 1 capsule by mouth 2 (two) times daily.   clobetasol ointment 0.05 % Commonly known as:  TEMOVATE Apply 1 application topically 2 (two) times daily.   DULoxetine 30 MG capsule Commonly known as:  CYMBALTA Take 1 capsule (30 mg total) by mouth daily.   Dupilumab 300 MG/2ML Sosy First dose- injet 600mg  (2 syringes) at same time. Then, inject 1 syringe (300mg /52ml) SQ every other week  gabapentin 300 MG capsule Commonly known as:  NEURONTIN Take 1 capsule (300 mg total) by mouth 3 (three) times daily.   glucose blood test strip Test 2 times daily with Accu-Chek monitor   HYDROcodone-acetaminophen 5-325 MG tablet Commonly known as:  NORCO/VICODIN Take 1 tablet by mouth every 6 (six) hours as needed for moderate pain.   Insulin Glargine 100 UNIT/ML Solostar Pen Commonly known as:  LANTUS SOLOSTAR Inject 38 Units into the skin daily at 10 pm.   Insulin Pen Needle 33G X 8 MM Misc 1 application by Does not apply route daily.   metFORMIN 1000 MG tablet Commonly known as:  GLUCOPHAGE Take 1 tablet (1,000 mg total) by mouth 2 (two) times daily with a meal.   simvastatin 40 MG tablet Commonly known as:  ZOCOR Take 1 tablet (40 mg total) by  mouth at bedtime.   tamsulosin 0.4 MG Caps capsule Commonly known as:  FLOMAX Take 1 capsule (0.4 mg total) by mouth daily.       Allergies:  Allergies  Allergen Reactions  . Codeine     Nausea and vomiting  . Influenza Vaccines     Kandis Mannan  . Ioxaglate     Shaking and rapid heart beat  . Ivp Dye [Iodinated Diagnostic Agents]     Shaking and rapid heart beat  . Macrodantin     Vomiting and nausea  . Nitrofurantoin Other (See Comments)    Family History: Family History  Problem Relation Age of Onset  . Diabetes Mother   . Heart attack Paternal Aunt   . Heart attack Paternal Uncle   . Heart attack Paternal Uncle   . Heart attack Paternal Uncle   . Heart attack Paternal Uncle   . Heart attack Paternal Aunt   . Heart attack Paternal Aunt   . Heart attack Paternal Aunt   . Other Son     Ulcerative Proctitis   . Colon cancer Neg Hx   . Colon polyps Neg Hx   . Rectal cancer Neg Hx   . Stomach cancer Neg Hx     Social History:  reports that she quit smoking about 17 years ago. Her smoking use included Cigarettes. She quit after 15.00 years of use. She has never used smokeless tobacco. She reports that she drinks alcohol. She reports that she does not use drugs.  ROS: UROLOGY Frequent Urination?: Yes Hard to postpone urination?: Yes Burning/pain with urination?: No Get up at night to urinate?: Yes Leakage of urine?: Yes Urine stream starts and stops?: No Trouble starting stream?: No Do you have to strain to urinate?: No Blood in urine?: Yes Urinary tract infection?: No Sexually transmitted disease?: No Injury to kidneys or bladder?: No Painful intercourse?: No Weak stream?: No Currently pregnant?: No Vaginal bleeding?: Yes Last menstrual period?: n  Gastrointestinal Nausea?: No Vomiting?: No Indigestion/heartburn?: No Diarrhea?: No Constipation?: No  Constitutional Fever: No Night sweats?: No Weight loss?: No Fatigue?: Yes  Skin Skin  rash/lesions?: Yes Itching?: Yes  Eyes Blurred vision?: Yes Double vision?: No  Ears/Nose/Throat Sore throat?: No  Hematologic/Lymphatic Swollen glands?: No Easy bruising?: No  Cardiovascular Leg swelling?: No Chest pain?: No  Respiratory Cough?: No Shortness of breath?: No  Endocrine Excessive thirst?: No  Musculoskeletal Back pain?: Yes Joint pain?: No  Neurological Headaches?: No Dizziness?: Yes  Psychologic Depression?: No Anxiety?: No  Physical Exam: BP 121/75   Pulse 96   Ht 5' 3.5" (1.613 m)   Wt 169 lb (76.7  kg)   BMI 29.47 kg/m   Constitutional:  Alert and oriented, No acute distress. HEENT: [place] AT, moist mucus membranes.  Trachea midline, no masses. Cardiovascular: No clubbing, cyanosis, or edema. Respiratory: Normal respiratory effort, no increased work of breathing. GI: Abdomen is soft, nontender, nondistended, no abdominal masses GU: No CVA tenderness.  Skin: No rashes, bruises or suspicious lesions. Lymph: No cervical or inguinal adenopathy. Neurologic: Grossly intact, no focal deficits, moving all 4 extremities. Psychiatric: Normal mood and affect.  Laboratory Data: Lab Results  Component Value Date   WBC 6.7 07/25/2016   HGB 14.5 07/25/2016   HCT 42.0 07/25/2016   MCV 95.5 07/25/2016   PLT 153.0 07/25/2016    Lab Results  Component Value Date   CREATININE 0.80 07/25/2016    No results found for: PSA  No results found for: TESTOSTERONE  Lab Results  Component Value Date   HGBA1C 7.0 (H) 11/29/2016    Urinalysis    Component Value Date/Time   BILIRUBINUR moderate 01/13/2017 0937   PROTEINUR >=300 01/13/2017 0937   UROBILINOGEN 1.0 01/13/2017 0937   NITRITE negative 01/13/2017 0937   LEUKOCYTESUR small (1+) (A) 01/13/2017 0937    Pertinent Imaging: CLINICAL DATA:  Right flank pain, gross hematuria for 3 days, history of kidney stones  EXAM: CT ABDOMEN AND PELVIS WITHOUT CONTRAST  TECHNIQUE: Multidetector CT  imaging of the abdomen and pelvis was performed following the standard protocol without IV contrast.  COMPARISON:  02/25/2014  FINDINGS: Lower chest: Lung bases shows no acute findings. Stable scarring left base laterally. Stable calcified granuloma left lower lobe posteriorly measures 8 mm.  Hepatobiliary: Unenhanced liver shows no biliary ductal dilatation. Status postcholecystectomy. Again noted nodular contour of the liver consistent with cirrhosis.  Pancreas: Unenhanced pancreas shows no acute abnormality. No peripancreatic inflammation.  Spleen: Again noted multiple punctate calcifications within spleen consistent with prior granulomatous disease. No focal splenic abnormality.  Adrenals/Urinary Tract: No adrenal gland mass. There is mild to moderate right hydronephrosis and proximal right hydroureter. At least 2 or 3 nonobstructive calcified calculi are noted within lower pole of the right kidney the largest measures 7.8 mm. Coarse nonobstructive calcified calculus in lower pole of the left kidney measures 7.5 mm.  Axial image 61 at least 3 obstructive calculi are noted in mid right ureter in linear distribution about L5/S1 disc level the largest in axial image 62 measures 5.7 mm. There is a 5 mm calculus just above the largest at the same level. A 3rd calculus just inferior to the largest axial image 63 measures 4.5 mm. These are best seen in sagittal image 69.  There is mild proximal right periureteral stranding. There is cortical scarring in lower pole of the left kidney. Cortical scarring in midpole of the left kidney.  Stomach/Bowel: No small bowel obstruction. Moderate stool noted throughout the colon. No gastric outlet obstruction. Question normal appendix or appendix stump axial image 63. Terminal ileum is unremarkable. Scattered diverticula are noted descending colon. Multiple sigmoid colon diverticula. Moderate stool noted within sigmoid colon and  rectum. No definite evidence of acute colitis or diverticulitis.  Vascular/Lymphatic: Atherosclerotic calcifications of abdominal aorta and iliac arteries are noted. No adenopathy.  Reproductive: The patient is status post hysterectomy  Other: No ascites or free abdominal air. Small umbilical hernia containing fat without evidence of acute complication.  Musculoskeletal: No destructive bony lesions are noted. Mild degenerative changes lumbar spine.  IMPRESSION: 1. There is mild to moderate right hydronephrosis and proximal right hydroureter. Mild  right proximal periureteral stranding.Axial image 61 at least 3 obstructive calculi are noted in mid right ureter in linear distribution about L5/S1 disc level the largest in axial image 62 measures 5.7 mm. There is a 5 mm calculus just above the largest at the same level. A 3rd calculus just inferior to the largest axial image 63 measures 4.5 mm. These are best seen in sagittal image 69. 2. Bilateral nonobstructive nephrolithiasis. Cortical scarring in mid and lower pole of the left kidney. 3. Again noted nodular contour of the liver consistent with cirrhosis. Status post cholecystectomy. 4. Scattered diverticula are noted transverse colon distally and descending colon. No evidence of acute diverticulitis. Moderate stool noted throughout the colon. Multiple sigmoid colon diverticula. No evidence of distal acute diverticulitis or colitis. Moderate stool noted in rectosigmoid colon. 5. No small bowel obstruction. 6. Atherosclerotic calcifications of abdominal aorta and iliac arteries.  Assessment & Plan:    1. Multiple right ureteral calculi 2. Bilateral nonobstructing calculi 3. Gross hematuria I discussed treatment options with the patient including medical expulsive therapy and ureteroscopy. I do not think lithotripsy would be a good option for her given her multiple stones. At this point, given her nonobstructing stones that  are large in size, the patient elected to undergo cystoscopy, bilateral ureteroscopy, laser lithotripsy, bilateral ureteral stent placement. She understands the need for ureteral stents, need for possible repeat procedure, risk of iatrogenic injury, bleeding, infection, and inability to clear all stones. She understands these risks and would like to proceed with surgery.   Nickie Retort, MD  [facility] [address], [place], [place] [address] [phone]

## 2017-01-22 NOTE — Transfer of Care (Signed)
Immediate Anesthesia Transfer of Care Note  Patient: Caroline Gray  Procedure(s) Performed: Procedure(s): URETEROSCOPY WITH HOLMIUM LASER LITHOTRIPSY (Bilateral) CYSTOSCOPY WITH STENT PLACEMENT (Bilateral)  Patient Location: PACU  Anesthesia Type:General  Level of Consciousness: awake  Airway & Oxygen Therapy: Patient connected to face mask oxygen  Post-op Assessment: Post -op Vital signs reviewed and stable  Post vital signs: stable  Last Vitals:  Vitals:   01/22/17 0853 01/22/17 0854  BP: 136/89 136/89  Pulse: 90 90  Resp: 19 19  Temp: 36.4 C 36.4 C    Last Pain:  Vitals:   01/22/17 0854  TempSrc: Temporal         Complications: No apparent anesthesia complications

## 2017-01-22 NOTE — Discharge Instructions (Signed)
AMBULATORY SURGERY  °DISCHARGE INSTRUCTIONS ° ° °1) The drugs that you were given will stay in your system until tomorrow so for the next 24 hours you should not: ° °A) Drive an automobile °B) Make any legal decisions °C) Drink any alcoholic beverage ° ° °2) You may resume regular meals tomorrow.  Today it is better to start with liquids and gradually work up to solid foods. ° °You may eat anything you prefer, but it is better to start with liquids, then soup and crackers, and gradually work up to solid foods. ° ° °3) Please notify your doctor immediately if you have any unusual bleeding, trouble breathing, redness and pain at the surgery site, drainage, fever, or pain not relieved by medication. ° ° ° °4) Additional Instructions: ° ° ° ° ° ° ° °Please contact your physician with any problems or Same Day Surgery at [phone], Monday through Friday 6 am to 4 pm, or [facility] number at [phone].AMBULATORY SURGERY  °DISCHARGE INSTRUCTIONS ° ° °5) The drugs that you were given will stay in your system until tomorrow so for the next 24 hours you should not: ° °D) Drive an automobile °E) Make any legal decisions °F) Drink any alcoholic beverage ° ° °6) You may resume regular meals tomorrow.  Today it is better to start with liquids and gradually work up to solid foods. ° °You may eat anything you prefer, but it is better to start with liquids, then soup and crackers, and gradually work up to solid foods. ° ° °7) Please notify your doctor immediately if you have any unusual bleeding, trouble breathing, redness and pain at the surgery site, drainage, fever, or pain not relieved by medication. ° ° ° °8) Additional Instructions: ° ° ° ° ° ° ° °Please contact your physician with any problems or Same Day Surgery at [phone], Monday through Friday 6 am to 4 pm, or [facility] number at [phone]. °

## 2017-01-22 NOTE — Anesthesia Post-op Follow-up Note (Cosign Needed)
Anesthesia QCDR form completed.        

## 2017-01-22 NOTE — Anesthesia Preprocedure Evaluation (Signed)
Anesthesia Evaluation  Patient identified by MRN, date of birth, ID band Patient awake    Reviewed: Allergy & Precautions, NPO status , Patient's Chart, lab work & pertinent test results  History of Anesthesia Complications Negative for: history of anesthetic complications  Airway Mallampati: III  TM Distance: >3 FB Neck ROM: Full    Dental no notable dental hx.    Pulmonary neg pulmonary ROS, neg sleep apnea, neg COPD, former smoker,    breath sounds clear to auscultation- rhonchi (-) wheezing      Cardiovascular Exercise Tolerance: Good hypertension, Pt. on medications (-) CAD, (-) Past MI and (-) Cardiac Stents  Rhythm:Regular Rate:Normal - Systolic murmurs and - Diastolic murmurs    Neuro/Psych  Headaches, PSYCHIATRIC DISORDERS Anxiety Depression    GI/Hepatic negative GI ROS, Neg liver ROS,   Endo/Other  diabetes, Insulin Dependent  Renal/GU Renal disease: nephrolithiasis.     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 No date: Psoriasis   Reproductive/Obstetrics                             Anesthesia Physical Anesthesia Plan  ASA: III  Anesthesia Plan: General   Post-op Pain Management:    Induction: Intravenous  Airway Management Planned: Oral ETT  Additional Equipment:   Intra-op Plan:   Post-operative Plan: Extubation in OR  Informed Consent: I have reviewed the patients History and Physical, chart,  labs and discussed the procedure including the risks, benefits and alternatives for the proposed anesthesia with the patient or authorized representative who has indicated his/her understanding and acceptance.   Dental advisory given  Plan Discussed with: CRNA and Anesthesiologist  Anesthesia Plan Comments:         Anesthesia Quick Evaluation

## 2017-01-24 ENCOUNTER — Other Ambulatory Visit: Payer: Self-pay

## 2017-01-24 MED ORDER — GLUCOSE BLOOD VI STRP: ORAL_STRIP | 3 refills | Status: AC

## 2017-01-24 NOTE — Telephone Encounter (Signed)
Last OV 01/21/17 last filled by Dr.Walker 01/15/16 100 3rf

## 2017-01-28 ENCOUNTER — Ambulatory Visit: Payer: Medicare Other | Admitting: Family Medicine

## 2017-01-31 ENCOUNTER — Ambulatory Visit: Payer: Medicare Other | Admitting: Urology

## 2017-01-31 VITALS — BP 123/74 | HR 101 | Ht 63.25 in | Wt 165.6 lb

## 2017-01-31 DIAGNOSIS — R310 Gross hematuria: Secondary | ICD-10-CM

## 2017-01-31 DIAGNOSIS — N200 Calculus of kidney: Secondary | ICD-10-CM

## 2017-01-31 LAB — URINALYSIS, COMPLETE
Bilirubin, UA: NEGATIVE
NITRITE UA: POSITIVE — AB
PH UA: 5 (ref 5.0–7.5)
Specific Gravity, UA: 1.015 (ref 1.005–1.030)
UUROB: 2 mg/dL — AB (ref 0.2–1.0)

## 2017-01-31 LAB — MICROSCOPIC EXAMINATION: Epithelial Cells (non renal): NONE SEEN /hpf (ref 0–10)

## 2017-01-31 MED ORDER — CIPROFLOXACIN HCL 500 MG PO TABS: 500.0000 mg | ORAL_TABLET | Freq: Once | ORAL | Status: DC

## 2017-01-31 MED ORDER — LIDOCAINE HCL 2 % EX GEL
1.0000 | Freq: Once | CUTANEOUS | Status: DC
Start: 2017-01-31 — End: 2017-01-31

## 2017-02-03 LAB — STONE ANALYSIS
CA PHOS CRY STONE QL IR: 7 %
Ca Oxalate,Dihydrate: 20 %
Ca Oxalate,Monohydr.: 73 %
Stone Weight KSTONE: 57.5 mg

## 2017-02-03 LAB — CULTURE, URINE COMPREHENSIVE

## 2017-02-04 ENCOUNTER — Telehealth: Payer: Self-pay

## 2017-02-04 NOTE — Telephone Encounter (Signed)
Pt called requesting ucx results. Made pt aware ucx was negative and grew out mixed flora. Pt voiced understanding.

## 2017-02-06 ENCOUNTER — Ambulatory Visit (INDEPENDENT_AMBULATORY_CARE_PROVIDER_SITE_OTHER): Payer: Medicare Other | Admitting: Urology

## 2017-02-06 VITALS — BP 130/73 | HR 107 | Ht 63.25 in | Wt 160.9 lb

## 2017-02-06 DIAGNOSIS — N200 Calculus of kidney: Secondary | ICD-10-CM

## 2017-02-06 MED ORDER — CIPROFLOXACIN HCL 500 MG PO TABS
500.0000 mg | ORAL_TABLET | Freq: Once | ORAL | Status: AC
  Administered 2017-02-06: 500 mg via ORAL

## 2017-02-06 MED ORDER — LIDOCAINE HCL 2 % EX GEL
1.0000 "application " | Freq: Once | CUTANEOUS | Status: AC
  Administered 2017-02-06: 1 via URETHRAL

## 2017-02-06 NOTE — Progress Notes (Signed)
   02/06/17  CC:  Chief Complaint  Patient presents with  . 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 At the time of surgery, her right ureteral stones were found in her vagina. Ureteral stone passage was was confirmed by ureteroscopy. Larger stones in the right kidney were removed. Pannephroscopy on the left did not reveal any significant stone burden. She presents today for bilateral ureteral stent removal.  She has an extensive stone history. All of her previous stones have been calcium oxalate. However, her stones from the above procedure did contain 7% calcium phosphate but the remaining 93% was calcium oxalate.  Many years ago, she has had open pyelolithotomy on both sides on more than one occasion. She does not feel that she has ever had a PCNL. She has also had multiple ureteroscopy procedures. She has undergone multiple 24-hour urine collections which have been unremarkable.  There were no vitals taken for this visit. NED. A&Ox3.   No respiratory distress   Abd soft, NT, ND Normal external genitalia with patent urethral meatus  Cystoscopy Procedure Note  Patient identification was confirmed, informed consent was obtained, and patient was prepped using Betadine solution.  Lidocaine jelly was administered per urethral meatus.    Preoperative abx where received prior to procedure.    Procedure: - Flexible cystoscope introduced, without any difficulty.    -Bilateral ureteral stents removed intact individually with flexible graspers per meatus.  - Ureteral orifices were normal in position and appearance.  Post-Procedure: - Patient tolerated the procedure well  Assessment/ Plan:  1. Bilateral  nephrolithiasis -She will need a renal ultrasound in 1 month to rule out iatrogenic hydronephrosis. -She has had multiple 24-hour urine studies which did not show a correctable metabolic defect, so there is likely little utility in repeating this in the future. -Will call patient with ultrasound results as we have already discussed her new stone analysis and the above.  Nickie Retort, MD

## 2017-02-14 ENCOUNTER — Ambulatory Visit
Admission: RE | Admit: 2017-02-14 | Discharge: 2017-02-14 | Disposition: A | Discharge status: Home / Self Care | Payer: Medicare Other | Source: Ambulatory Visit | Attending: Urology | Admitting: Urology

## 2017-02-14 DIAGNOSIS — N289 Disorder of kidney and ureter, unspecified: Secondary | ICD-10-CM | POA: Diagnosis not present

## 2017-02-14 DIAGNOSIS — N200 Calculus of kidney: Secondary | ICD-10-CM | POA: Insufficient documentation

## 2017-02-18 ENCOUNTER — Telehealth: Payer: Self-pay

## 2017-02-18 NOTE — Telephone Encounter (Signed)
Nickie Retort, MD  Lestine Box, LPN        Please let patient know her ultrasound did not show a blockage. The stones mentioned in the report are insignificant and nothing to worry about. thanks    Spoke with pt in reference to RUS results. Pt voiced understanding.

## 2017-02-24 ENCOUNTER — Other Ambulatory Visit: Payer: Self-pay | Admitting: Family Medicine

## 2017-03-04 ENCOUNTER — Ambulatory Visit (INDEPENDENT_AMBULATORY_CARE_PROVIDER_SITE_OTHER): Payer: Medicare Other | Admitting: Family Medicine

## 2017-03-04 VITALS — BP 130/68 | HR 95 | Temp 97.9°F | Wt 165.0 lb

## 2017-03-04 DIAGNOSIS — N6320 Unspecified lump in the left breast, unspecified quadrant: Secondary | ICD-10-CM

## 2017-03-04 NOTE — Patient Instructions (Signed)
Nice to see you. We'll get a mammogram and ultrasound ordered.

## 2017-03-04 NOTE — Progress Notes (Signed)
  Tommi Rumps, MD Phone: [phone]  Caroline Gray is a 71 y.o. female who presents today for same-day visit.  Patient notes for the last month she's noted a breast lump in the left upper outer quadrant of her left breast. Notes it feels mildly irritated. It has been sore. She's had a biopsy in her right breast previously due to a lump. She notes her mother had breast cancer. Most recent mammogram from 2016 was BI-RADS 1.  ROS see history of present illness  Objective  Physical Exam Vitals:   03/04/17 1420  BP: 130/68  Pulse: 95  Temp: 97.9 F (36.6 C)    BP Readings from Last 3 Encounters:  03/04/17 130/68  02/06/17 130/73  01/31/17 123/74   Wt Readings from Last 3 Encounters:  03/04/17 165 lb (74.8 kg)  02/06/17 160 lb 14.4 oz (73 kg)  01/31/17 165 lb 9.6 oz (75.1 kg)    Physical Exam  Constitutional: No distress.  Cardiovascular: Normal rate, regular rhythm and normal heart sounds.   Pulmonary/Chest: Effort normal and breath sounds normal.  Genitourinary:  Genitourinary Comments: Breast with palpable mass in the left upper outer quadrant, no other masses or skin changes and left breast, right breast with no masses or skin changes, no axillary masses bilaterally  Neurological: She is alert.  Skin: She is not diaphoretic.     Assessment/Plan: Please see individual problem list.  Left breast mass Patient noted this. Noted on exam. We'll obtain diagnostic mammogram and left breast ultrasound.   Orders Placed This Encounter  Procedures  . MM Digital Diagnostic Bilat    Standing Status:   Future    Standing Expiration Date:   05/04/2018    Order Specific Question:   Reason for Exam (SYMPTOM  OR DIAGNOSIS REQUIRED)    Answer:   left breat upper outer quadrant with palpable lesion with minimal tenderness    Order Specific Question:   Preferred imaging location?    Answer:   [facility]  . US BREAST COMPLETE UNI LEFT INC AXILLA    Standing Status:   Future      Standing Expiration Date:   05/04/2018    Order Specific Question:   Reason for Exam (SYMPTOM  OR DIAGNOSIS REQUIRED)    Answer:   left breast with lesion palpated upper outer quadrant, 2 o'clock position    Order Specific Question:   Preferred imaging location?    Answer:   [facility]    Tommi Rumps, MD [place]

## 2017-03-04 NOTE — Assessment & Plan Note (Signed)
Patient noted this. Noted on exam. We'll obtain diagnostic mammogram and left breast ultrasound.

## 2017-03-04 NOTE — Progress Notes (Signed)
Pre visit review using our clinic review tool, if applicable. No additional management support is needed unless otherwise documented below in the visit note. 

## 2017-03-05 ENCOUNTER — Other Ambulatory Visit: Payer: Self-pay | Admitting: Family Medicine

## 2017-03-05 DIAGNOSIS — N6320 Unspecified lump in the left breast, unspecified quadrant: Secondary | ICD-10-CM

## 2017-03-06 NOTE — Addendum Note (Signed)
Addended by: Leone Haven on: 03/06/2017 03:31 PM   Modules accepted: Orders

## 2017-03-14 ENCOUNTER — Ambulatory Visit
Admission: RE | Admit: 2017-03-14 | Discharge: 2017-03-14 | Disposition: A | Discharge status: Home / Self Care | Payer: Medicare Other | Source: Ambulatory Visit | Attending: Family Medicine | Admitting: Family Medicine

## 2017-03-14 DIAGNOSIS — N6320 Unspecified lump in the left breast, unspecified quadrant: Secondary | ICD-10-CM

## 2017-05-19 ENCOUNTER — Other Ambulatory Visit: Payer: Self-pay | Admitting: Family Medicine

## 2017-05-23 ENCOUNTER — Other Ambulatory Visit: Payer: Self-pay

## 2017-05-23 DIAGNOSIS — IMO0002 Reserved for concepts with insufficient information to code with codable children: Secondary | ICD-10-CM

## 2017-05-23 DIAGNOSIS — Z794 Long term (current) use of insulin: Principal | ICD-10-CM

## 2017-05-23 DIAGNOSIS — E1165 Type 2 diabetes mellitus with hyperglycemia: Principal | ICD-10-CM

## 2017-05-23 DIAGNOSIS — E1140 Type 2 diabetes mellitus with diabetic neuropathy, unspecified: Secondary | ICD-10-CM

## 2017-05-23 MED ORDER — INSULIN GLARGINE 100 UNIT/ML SOLOSTAR PEN: 38.0000 [IU] | PEN_INJECTOR | Freq: Every day | SUBCUTANEOUS | 4 refills | Status: AC

## 2017-05-23 MED FILL — TREMFYA/100MG/ML/SOSY: TREMFYA/100MG/ML/SOSY | 84 days supply | Qty: 3 | Fill #0

## 2017-06-13 MED ORDER — CALCIPOTRIENE 0.005 % TOPICAL CREAM
Freq: Two times a day (BID) | TOPICAL | 1 refills | 0.00000 days | Status: CP
Start: 2017-06-13 — End: ?

## 2017-06-13 MED ORDER — CLOBETASOL 0.05 % TOPICAL OINTMENT
Freq: Two times a day (BID) | TOPICAL | 1 refills | 0.00000 days | Status: CP
Start: 2017-06-13 — End: ?

## 2017-06-23 ENCOUNTER — Other Ambulatory Visit: Payer: Self-pay | Admitting: Family Medicine

## 2017-06-23 DIAGNOSIS — F4323 Adjustment disorder with mixed anxiety and depressed mood: Secondary | ICD-10-CM

## 2017-07-15 ENCOUNTER — Encounter: Payer: Self-pay | Admitting: Family Medicine

## 2017-07-15 ENCOUNTER — Other Ambulatory Visit (INDEPENDENT_AMBULATORY_CARE_PROVIDER_SITE_OTHER): Payer: Medicare Other

## 2017-07-15 ENCOUNTER — Ambulatory Visit (INDEPENDENT_AMBULATORY_CARE_PROVIDER_SITE_OTHER): Payer: Medicare Other | Admitting: Family Medicine

## 2017-07-15 VITALS — BP 150/80 | HR 97 | Temp 97.9°F | Wt 171.0 lb

## 2017-07-15 DIAGNOSIS — M545 Low back pain, unspecified: Secondary | ICD-10-CM

## 2017-07-15 DIAGNOSIS — H9319 Tinnitus, unspecified ear: Secondary | ICD-10-CM | POA: Insufficient documentation

## 2017-07-15 DIAGNOSIS — R030 Elevated blood-pressure reading, without diagnosis of hypertension: Secondary | ICD-10-CM | POA: Diagnosis not present

## 2017-07-15 DIAGNOSIS — IMO0002 Reserved for concepts with insufficient information to code with codable children: Secondary | ICD-10-CM

## 2017-07-15 DIAGNOSIS — N6320 Unspecified lump in the left breast, unspecified quadrant: Secondary | ICD-10-CM | POA: Diagnosis not present

## 2017-07-15 DIAGNOSIS — N644 Mastodynia: Secondary | ICD-10-CM | POA: Diagnosis not present

## 2017-07-15 DIAGNOSIS — H9312 Tinnitus, left ear: Secondary | ICD-10-CM | POA: Diagnosis not present

## 2017-07-15 DIAGNOSIS — E1165 Type 2 diabetes mellitus with hyperglycemia: Secondary | ICD-10-CM

## 2017-07-15 DIAGNOSIS — Z794 Long term (current) use of insulin: Secondary | ICD-10-CM

## 2017-07-15 DIAGNOSIS — L409 Psoriasis, unspecified: Secondary | ICD-10-CM | POA: Diagnosis not present

## 2017-07-15 DIAGNOSIS — E1142 Type 2 diabetes mellitus with diabetic polyneuropathy: Secondary | ICD-10-CM

## 2017-07-15 LAB — POCT URINALYSIS DIPSTICK
GLUCOSE UA: NEGATIVE
KETONES UA: NEGATIVE
Nitrite, UA: NEGATIVE
RBC UA: NEGATIVE
SPEC GRAV UA: 1.02 (ref 1.010–1.025)
UROBILINOGEN UA: 1 U/dL
pH, UA: 5.5 (ref 5.0–8.0)

## 2017-07-15 LAB — URINALYSIS, MICROSCOPIC ONLY: RBC / HPF: NONE SEEN (ref 0–?)

## 2017-07-15 LAB — COMPREHENSIVE METABOLIC PANEL
ALT: 26 U/L (ref 0–35)
AST: 40 U/L — ABNORMAL HIGH (ref 0–37)
Albumin: 3.8 g/dL (ref 3.5–5.2)
Alkaline Phosphatase: 75 U/L (ref 39–117)
BILIRUBIN TOTAL: 0.7 mg/dL (ref 0.2–1.2)
BUN: 12 mg/dL (ref 6–23)
CALCIUM: 10 mg/dL (ref 8.4–10.5)
CO2: 33 meq/L — AB (ref 19–32)
CREATININE: 0.83 mg/dL (ref 0.40–1.20)
Chloride: 100 mEq/L (ref 96–112)
GFR: 71.54 mL/min (ref >60.00)
Glucose, Bld: 223 mg/dL — ABNORMAL HIGH (ref 70–99)
Potassium: 4.2 mEq/L (ref 3.5–5.1)
Sodium: 138 mEq/L (ref 135–145)
Total Protein: 8 g/dL (ref 6.0–8.3)

## 2017-07-15 LAB — MICROALBUMIN / CREATININE URINE RATIO
Creatinine,U: 161.2 mg/dL
MICROALB UR: 4.7 mg/dL — AB (ref 0.0–1.9)
Microalb Creat Ratio: 2.9 mg/g (ref 0.0–30.0)

## 2017-07-15 LAB — HEMOGLOBIN A1C: Hgb A1c MFr Bld: 6.7 % — ABNORMAL HIGH (ref 4.6–6.5)

## 2017-07-15 NOTE — Progress Notes (Signed)
Tommi Rumps, MD Phone: [phone]  Caroline Gray is a 71 y.o. female who presents today for follow-up.  Elevated blood pressure: Has been slightly elevated in the past. She notes no chest pain, shortness breath, or edema. She's not been checking it recently at home.  Diabetes: Blood sugars range from the upper 70s to about 130. She's currently taking 36 units of Lantus. She feels as though there is some seepage of the Lantus when she injects it. She's taking metformin. Occasional hypoglycemic symptoms that resolved with eating.  Patient notes a daily right low back pain over the last several weeks. Notes her urine is bright yellow at times. No incontinence. No saddle anesthesia. No numbness or weakness. No radiation. No dysuria. No hematuria. She does have a history of kidney stones.  Tinnitus: Noted in her left ear. Had been in bilateral ears. Is constant in her left ear. Some hearing loss. She does shoot guns though uses her right side. 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 She's never been evaluated.  Psoriasis: Notes her hands are bothering her a little more than previously. She is on a new medication through dermatology. They've referred her to a specialist.  Left breast mass: Previously palpated. She had a negative mammogram and ultrasound with no mass palpated by the radiologist. She notes it has not been bothering her very much recently. She has not noticed a mass.  PMH: Former smoker   ROS see history of present illness  Objective  Physical Exam Vitals:   07/15/17 0836 07/15/17 0857  BP: (!) 170/82 (!) 150/80  Pulse: 97   Temp: 97.9 F (36.6 C)   SpO2: 94%     BP Readings from Last 3 Encounters:  07/15/17 (!) 150/80  03/04/17 130/68  02/06/17 130/73   Wt Readings from Last 3 Encounters:  07/15/17 171 lb (77.6 kg)  03/04/17 165 lb (74.8 kg)  02/06/17 160 lb 14.4 oz (73 kg)    Physical Exam  Constitutional: No distress.  HENT:  Normal TMs bilaterally, decreased hearing to  finger rub on the left  Cardiovascular: Normal rate, regular rhythm and normal heart sounds.   Pulmonary/Chest: Effort normal and breath sounds normal.  Genitourinary:  Genitourinary Comments: Breast exam with left upper outer quadrant tenderness, no palpable mass, no other tenderness in either breast or masses palpated in either breast or axilla  Musculoskeletal: She exhibits no edema.  No midline spine tenderness, no midline spine step-off, there is paraspinous muscular tenderness in the right lumbar spine  Neurological: She is alert. Gait normal.  5/5 strength bilateral quads, hamstrings, plantar flexion, and dorsal flexion, sensation light touch intact in bilateral extremities  Skin: She is not diaphoretic.  Palmar psoriasis noted     Assessment/Plan: Please see individual problem list.  Diabetes mellitus type 2, uncontrolled Check A1c. Continue current medication. We'll get her set up with our pharmacist for insulin teaching given possible seepage of insulin.  Psoriasis Continue to follow with dermatology.  Elevated blood pressure reading Elevated today. Discussed starting a medication though she was hesitant. She'll recheck her blood pressure at home for the next week and then contact us with the results. If elevated at home consider starting medication.  Left breast mass No mass noted today though she does have tenderness in a similar position to the prior abnormality that was noted. Given persistent issues we will refer to Gen. surgery for evaluation to see if anything further needs to be done.  Tinnitus Refer to ENT for evaluation.  Acute right-sided  low back pain without sciatica Suspect muscular strain though she does have a history of nephrolithiasis. We'll check her urine. She'll do exercises. Discussed Tylenol and heat.    Orders Placed This Encounter  Procedures  . HgB A1c  . Comp Met (CMET)  . Urine Microalbumin w/creat. ratio  . Ambulatory referral to General  Surgery    Referral Priority:   Routine    Referral Type:   Surgical    Referral Reason:   Specialty Services Required    Requested Specialty:   General Surgery    Number of Visits Requested:   1  . Ambulatory referral to ENT    Referral Priority:   Routine    Referral Type:   Consultation    Referral Reason:   Specialty Services Required    Requested Specialty:   Otolaryngology    Number of Visits Requested:   1  . POCT Urinalysis Dipstick   Tommi Rumps, MD [place]

## 2017-07-15 NOTE — Patient Instructions (Addendum)
Nice to see you. We'll check some labs today and call you the results. Please monitor your blood pressure for the next week and contact us with your readings. We'll get you to see ENT for your left year. We will have you see general surgery for your left breast. Please continue to follow with dermatology for psoriasis. Please do the following exercises for your back.   Back Exercises The following exercises strengthen the muscles that help to support the back. They also help to keep the lower back flexible. Doing these exercises can help to prevent back pain or lessen existing pain. If you have back pain or discomfort, try doing these exercises 2-3 times each day or as told by your health care provider. When the pain goes away, do them once each day, but increase the number of times that you repeat the steps for each exercise (do more repetitions). If you do not have back pain or discomfort, do these exercises once each day or as told by your health care provider. Exercises Single Knee to Chest  Repeat these steps 3-5 times for each leg: 1. Lie on your back on a firm bed or the floor with your legs extended. 2. Bring one knee to your chest. Your other leg should stay extended and in contact with the floor. 3. Hold your knee in place by grabbing your knee or thigh. 4. Pull on your knee until you feel a gentle stretch in your lower back. 5. Hold the stretch for 10-30 seconds. 6. Slowly release and straighten your leg.  Pelvic Tilt  Repeat these steps 5-10 times: 1. Lie on your back on a firm bed or the floor with your legs extended. 2. Bend your knees so they are pointing toward the ceiling and your feet are flat on the floor. 3. Tighten your lower abdominal muscles to press your lower back against the floor. This motion will tilt your pelvis so your tailbone points up toward the ceiling instead of pointing to your feet or the floor. 4. With gentle tension and even breathing, hold this  position for 5-10 seconds.  Cat-Cow  Repeat these steps until your lower back becomes more flexible: 1. Get into a hands-and-knees position on a firm surface. Keep your hands under your shoulders, and keep your knees under your hips. You may place padding under your knees for comfort. 2. Let your head hang down, and point your tailbone toward the floor so your lower back becomes rounded like the back of a cat. 3. Hold this position for 5 seconds. 4. Slowly lift your head and point your tailbone up toward the ceiling so your back forms a sagging arch like the back of a cow. 5. Hold this position for 5 seconds.  Press-Ups  Repeat these steps 5-10 times: 1. Lie on your abdomen (face-down) on the floor. 2. Place your palms near your head, about shoulder-width apart. 3. While you keep your back as relaxed as possible and keep your hips on the floor, slowly straighten your arms to raise the top half of your body and lift your shoulders. Do not use your back muscles to raise your upper torso. You may adjust the placement of your hands to make yourself more comfortable. 4. Hold this position for 5 seconds while you keep your back relaxed. 5. Slowly return to lying flat on the floor.  Bridges  Repeat these steps 10 times: 1. Lie on your back on a firm surface. 2. Bend your knees so  they are pointing toward the ceiling and your feet are flat on the floor. 3. Tighten your buttocks muscles and lift your buttocks off of the floor until your waist is at almost the same height as your knees. You should feel the muscles working in your buttocks and the back of your thighs. If you do not feel these muscles, slide your feet 1-2 inches farther away from your buttocks. 4. Hold this position for 3-5 seconds. 5. Slowly lower your hips to the starting position, and allow your buttocks muscles to relax completely.  If this exercise is too easy, try doing it with your arms crossed over your chest. Abdominal  Crunches  Repeat these steps 5-10 times: 1. Lie on your back on a firm bed or the floor with your legs extended. 2. Bend your knees so they are pointing toward the ceiling and your feet are flat on the floor. 3. Cross your arms over your chest. 4. Tip your chin slightly toward your chest without bending your neck. 5. Tighten your abdominal muscles and slowly raise your trunk (torso) high enough to lift your shoulder blades a tiny bit off of the floor. Avoid raising your torso higher than that, because it can put too much stress on your low back and it does not help to strengthen your abdominal muscles. 6. Slowly return to your starting position.  Back Lifts Repeat these steps 5-10 times: 1. Lie on your abdomen (face-down) with your arms at your sides, and rest your forehead on the floor. 2. Tighten the muscles in your legs and your buttocks. 3. Slowly lift your chest off of the floor while you keep your hips pressed to the floor. Keep the back of your head in line with the curve in your back. Your eyes should be looking at the floor. 4. Hold this position for 3-5 seconds. 5. Slowly return to your starting position.  Contact a health care provider if:  Your back pain or discomfort gets much worse when you do an exercise.  Your back pain or discomfort does not lessen within 2 hours after you exercise. If you have any of these problems, stop doing these exercises right away. Do not do them again unless your health care provider says that you can. Get help right away if:  You develop sudden, severe back pain. If this happens, stop doing the exercises right away. Do not do them again unless your health care provider says that you can. This information is not intended to replace advice given to you by your health care provider. Make sure you discuss any questions you have with your health care provider. Document Released: 12/12/2004 Document Revised: 03/13/2016 Document Reviewed:  12/29/2014 Elsevier Interactive Patient Education  2017 [employer].

## 2017-07-15 NOTE — Assessment & Plan Note (Signed)
No mass noted today though she does have tenderness in a similar position to the prior abnormality that was noted. Given persistent issues we will refer to Gen. surgery for evaluation to see if anything further needs to be done.

## 2017-07-15 NOTE — Assessment & Plan Note (Signed)
Suspect muscular strain though she does have a history of nephrolithiasis. We'll check her urine. She'll do exercises. Discussed Tylenol and heat.

## 2017-07-15 NOTE — Assessment & Plan Note (Signed)
Refer to ENT for evaluation 

## 2017-07-15 NOTE — Addendum Note (Signed)
Addended by: Arby Barrette on: 07/15/2017 03:16 PM   Modules accepted: Orders

## 2017-07-15 NOTE — Assessment & Plan Note (Signed)
Check A1c. Continue current medication. We'll get her set up with our pharmacist for insulin teaching given possible seepage of insulin.

## 2017-07-15 NOTE — Assessment & Plan Note (Signed)
Elevated today. Discussed starting a medication though she was hesitant. She'll recheck her blood pressure at home for the next week and then contact us with the results. If elevated at home consider starting medication.

## 2017-07-15 NOTE — Assessment & Plan Note (Signed)
Continue to follow with dermatology.

## 2017-07-16 ENCOUNTER — Other Ambulatory Visit: Payer: Self-pay

## 2017-07-16 MED ORDER — DULOXETINE HCL 30 MG PO CPEP: 30.0000 mg | ORAL_CAPSULE | Freq: Every day | ORAL | 1 refills | Status: AC

## 2017-07-16 MED ORDER — GABAPENTIN 300 MG PO CAPS: 300.0000 mg | ORAL_CAPSULE | Freq: Three times a day (TID) | ORAL | 1 refills | Status: AC

## 2017-07-17 ENCOUNTER — Ambulatory Visit (INDEPENDENT_AMBULATORY_CARE_PROVIDER_SITE_OTHER): Payer: Medicare Other | Admitting: General Surgery

## 2017-07-17 ENCOUNTER — Inpatient Hospital Stay: Payer: Self-pay

## 2017-07-17 ENCOUNTER — Encounter: Payer: Self-pay | Admitting: General Surgery

## 2017-07-17 VITALS — BP 130/50 | HR 108 | Resp 14 | Ht 63.7 in | Wt 170.0 lb

## 2017-07-17 DIAGNOSIS — N6321 Unspecified lump in the left breast, upper outer quadrant: Secondary | ICD-10-CM

## 2017-07-17 LAB — URINE CULTURE

## 2017-07-17 NOTE — Patient Instructions (Signed)
Patient to return as needed. The patient is aware to call back for any questions or concerns.   The patient is aware to use an antiinflammatory of choice (Advil or Aleve) as needed for comfort.

## 2017-07-17 NOTE — Progress Notes (Signed)
Patient ID: Caroline Gray, female   DOB: 1946-05-26, 71 y.o.   MRN: [id number]  Chief Complaint  Patient presents with  . Breast Problem    HPI ROSANGELICA Gray is a 71 y.o. female.  who presents for a breast evaluation due to left breast pain. The most recent mammogram was done on 03-14-17. She felt a lump because the left breast was sore around January. She states it is about quarter size and it has not changed in size. She states the area is still tender some days worse than others. She stats that Dr Caryl Bis could feel it as well. Patient does perform regular self breast checks and gets regular mammograms done.     HPI  Past Medical History:  Diagnosis Date  . Adenomatous colon polyp   . Anxiety   . Arthritis   . Cataract   . Diabetes mellitus   . Diverticulosis   . 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 History of kidney stones   . Hx: UTI (urinary tract infection)   . Hyperlipidemia   . Hypertension   . IBS (irritable bowel syndrome)   . Positive PPD   . Psoriasis     Past Surgical History:  Procedure Laterality Date  . ABDOMINAL HYSTERECTOMY    . ABDOMINAL HYSTERECTOMY    . APPENDECTOMY    . BREAST BIOPSY Right 1990   neg  . CATARACT EXTRACTION    . CHOLECYSTECTOMY    . CYSTOSCOPY WITH STENT PLACEMENT Bilateral 01/22/2017   Procedure: CYSTOSCOPY WITH STENT PLACEMENT;  Surgeon: Nickie Retort, MD;  Location: [facility];  Service: Urology;  Laterality: Bilateral;  . EYE SURGERY Bilateral    Cataract Extraction with IOL   . LITHOTRIPSY  3.7.13   Dr Jacqlyn Larsen  . ROTATOR CUFF REPAIR Right 08/2005  . SPINE SURGERY    . URETEROSCOPY WITH HOLMIUM LASER LITHOTRIPSY Bilateral 01/22/2017   Procedure: URETEROSCOPY WITH HOLMIUM LASER LITHOTRIPSY;  Surgeon: Nickie Retort, MD;  Location: [facility];  Service: Urology;  Laterality: Bilateral;    Family History  Problem Relation Age of Onset  . Diabetes Mother   . Breast cancer Mother 71  . Heart attack Paternal Aunt   . Heart attack Paternal  Uncle   . Heart attack Paternal Uncle   . Heart attack Paternal Uncle   . Heart attack Paternal Uncle   . Heart attack Paternal Aunt   . Heart attack Paternal Aunt   . Heart attack Paternal Aunt   . Other Son        Ulcerative Proctitis   . Colon cancer Neg Hx   . Colon polyps Neg Hx   . Rectal cancer Neg Hx   . Stomach cancer Neg Hx     Social History Social History  Substance Use Topics  . Smoking status: Former Smoker    Years: 15.00    Types: Cigarettes    Quit date: 11/19/1999  . Smokeless tobacco: Never Used  . Alcohol use Yes     Comment: occassionaly    Allergies  Allergen Reactions  . Codeine     Nausea and vomiting  . Influenza Vaccines     Kandis Mannan  . Ioxaglate     Shaking and rapid heart beat  . Ivp Dye [Iodinated Diagnostic Agents]     Shaking and rapid heart beat Has had since this reaction and been okay   . Macrodantin     Vomiting and nausea  . Nitrofurantoin Other (See Comments)  Current Outpatient Prescriptions  Medication Sig Dispense Refill  . aspirin 81 MG tablet Take 1 tablet (81 mg total) by mouth daily. 30 tablet   . calcipotriene (DOVONOX) 0.005 % cream Apply 1 application topically 2 (two) times daily.     . Calcium Citrate-Vitamin D (CALCIUM CITRATE + D PO) Take 1 capsule by mouth 2 (two) times daily.      . clobetasol ointment (TEMOVATE) 1.61 % Apply 1 application topically 2 (two) times daily.    Stasia Cavalier (EUCRISA) 2 % OINT Apply 1 application topically 2 (two) times daily.    . DULoxetine (CYMBALTA) 30 MG capsule Take 1 capsule (30 mg total) by mouth daily. 90 capsule 1  . gabapentin (NEURONTIN) 300 MG capsule Take 1 capsule (300 mg total) by mouth 3 (three) times daily. 270 capsule 1  . glucose blood test strip Test 2 times daily with Accu-Chek monitor 100 each 3  . Guselkumab 100 MG/ML SOSY 100 mg. Once every 8 weeks    . ibuprofen (ADVIL,MOTRIN) 200 MG tablet Take 200 mg by mouth every 6 (six) hours as needed for mild  pain.    . Insulin Glargine (LANTUS SOLOSTAR) 100 UNIT/ML Solostar Pen Inject 38 Units into the skin daily at 10 pm. 15 mL 4  . Insulin Pen Needle 33G X 8 MM MISC 1 application by Does not apply route daily. 100 each 11  . metFORMIN (GLUCOPHAGE) 1000 MG tablet Take 1 tablet (1,000 mg total) by mouth 2 (two) times daily with a meal. 180 tablet 3  . simvastatin (ZOCOR) 40 MG tablet TAKE 1 TABLET BY MOUTH AT BEDTIME 90 tablet 1   No current facility-administered medications for this visit.     Review of Systems Review of Systems  Constitutional: Negative.   Respiratory: Negative.   Cardiovascular: Negative.     Blood pressure (!) 130/50, pulse (!) 108, resp. rate 14, height 5' 3.7" (1.618 m), weight 170 lb (77.1 kg).  Physical Exam Physical Exam  Constitutional: She is oriented to person, place, and time. She appears well-developed and well-nourished.  Eyes: Conjunctivae are normal. No scleral icterus.  Neck: Neck supple.  Cardiovascular: Normal rate, regular rhythm and normal heart sounds.   Pulmonary/Chest: Effort normal and breath sounds normal. Right breast exhibits no inverted nipple, no mass, no nipple discharge, no skin change and no tenderness. Left breast exhibits tenderness. Left breast exhibits no inverted nipple, no nipple discharge and no skin change.    Abdominal: Soft. Bowel sounds are normal. There is no tenderness.  Lymphadenopathy:    She has no cervical adenopathy.    She has no axillary adenopathy.  Neurological: She is alert and oriented to person, place, and time.  Skin: Skin is warm and dry.   Targeted ultrasound over the area of patient's concern at 1:00 location left breast 7 cm from nipple shows normal glandular tissue. No abnormality of the skin of the subcutaneous tissue was noted. And no apparent abnormality in the underlying muscle.  Data Reviewed Mammogram/US from 4/18 and notes reviewed   Assessment    Focal pain and tenderness in the left breast  with no apparent findings on exam or on ultrasound. Prior imaging was also normal in April of this year. Family history of breast cancer    Plan   Patient advised-no findings of concern. Hard to tell if the tenderness is related to the muscle or the actual breast tissue. Recommended use of from a tight fitting bra or sports bra and  over-the-counter anti-inflammatories when necessary. There is any further worsening of her symptoms or she feels something more definite to call.  Patient to return as needed. The patient is aware to call back for any questions or concerns.  HPI, Physical Exam, Assessment and Plan have been scribed under the direction and in the presence of Mckinley Jewel, MD Karie Fetch, RN     I have completed the exam and reviewed the above documentation for accuracy and completeness.  I agree with the above.  Haematologist has been used and any errors in dictation or transcription are unintentional.  Tekeya Geffert G. Jamal Collin, M.D., F.A.C.S.    Junie Panning G 07/17/2017, 11:04 AM

## 2017-07-18 ENCOUNTER — Telehealth: Payer: Self-pay | Admitting: Family Medicine

## 2017-07-18 DIAGNOSIS — I10 Essential (primary) hypertension: Secondary | ICD-10-CM

## 2017-07-18 NOTE — Telephone Encounter (Signed)
Patient was seen on 8/28 and per the note was hesistant to start medication per your lab note a medication was going to be sent in, please advise, she is at the pharmacy. thanks

## 2017-07-18 NOTE — Telephone Encounter (Signed)
Pt called stating a brand new Rx was supposed to have been sent to pharmacy medication is lincinopril. Thank you!  Pharmacy is [facility], [place]  Call pt @ [phone]. Thank you!

## 2017-07-19 MED ORDER — LOSARTAN POTASSIUM 50 MG PO TABS: 50.0000 mg | ORAL_TABLET | Freq: Every day | ORAL | 3 refills | Status: DC

## 2017-07-19 NOTE — Telephone Encounter (Signed)
Losartan sent to pharmacy. Patient will need to have lab work checked 1 week after she starts on this medication. Order has been placed. Please get her set up for a lab appointment. Thanks.

## 2017-07-22 NOTE — Telephone Encounter (Signed)
Spoke with the patient and scheduled the lab appt. Thanks.  She is hesitant about this medication as she has had kidney issues previously.

## 2017-07-23 ENCOUNTER — Encounter: Payer: Self-pay | Admitting: Family Medicine

## 2017-07-25 ENCOUNTER — Other Ambulatory Visit (INDEPENDENT_AMBULATORY_CARE_PROVIDER_SITE_OTHER): Payer: Medicare Other

## 2017-07-25 DIAGNOSIS — I10 Essential (primary) hypertension: Secondary | ICD-10-CM

## 2017-07-25 LAB — BASIC METABOLIC PANEL
BUN: 13 mg/dL (ref 6–23)
CO2: 31 mEq/L (ref 19–32)
Calcium: 9.4 mg/dL (ref 8.4–10.5)
Chloride: 104 mEq/L (ref 96–112)
Creatinine, Ser: 0.81 mg/dL (ref 0.40–1.20)
GFR: 73.58 mL/min (ref >60.00)
Glucose, Bld: 177 mg/dL — ABNORMAL HIGH (ref 70–99)
POTASSIUM: 3.8 meq/L (ref 3.5–5.1)
SODIUM: 141 meq/L (ref 135–145)

## 2017-07-30 ENCOUNTER — Ambulatory Visit: Admission: RE | Admit: 2017-07-30 | Discharge: 2017-07-30

## 2017-07-30 DIAGNOSIS — Z79899 Other long term (current) drug therapy: Secondary | ICD-10-CM

## 2017-07-30 DIAGNOSIS — L403 Pustulosis palmaris et plantaris: Secondary | ICD-10-CM

## 2017-07-30 DIAGNOSIS — L409 Psoriasis, unspecified: Principal | ICD-10-CM

## 2017-07-30 MED ORDER — DAPSONE 25 MG TABLET
ORAL_TABLET | Freq: Every day | ORAL | 2 refills | 0.00000 days | Status: CP
Start: 2017-07-30 — End: 2017-10-28

## 2017-07-30 NOTE — Unmapped (Addendum)
Dermatology Follow-Up Note    Assessment and Plan:      Severe and Recalcitrant Palmar/Plantar Psoriasis:    ?? Dupilumab/Cellcept combination has been ineffective  ?? She has had a few flares in the last 3 months, but today is somewhat stable/maybe improved   ?? Plan to continue 25mg  of Dapsone daily as she had previously had pustules on exam - none today on hands, has had a few on the feet   ?? She has completed the 3 loading doses of the Tremfya and will continue the 100mg  every 8 weeks   ?? Cont clobetasol to hands and feet bid  ?? Cont dovonex to hands and feet bid    High risk med monitoring:  -Check CBC for dapsone  -BMP from last week was appropriate      RTC: 3months     Subjective:     REASON FOR VISIT: established patient with history of recalcitrant palmoplantar psoriasis here for scheduled f/u      Sheila Gill is a 71 y.o. yo female with severe recalcitrant palmoplantar psoriasis vs hand/foot dermatitis who presents today for f/u of the same. She was last seen 3 months ago and is currently on Tremfya (completed loading doses) and was on 25mg  dapsone for her palmo-plantar eruption. She ran out of dapsone 2 weeks ago. She is using clobetasol and dovonex. She has had a few flares in the interim, but is somewhat stable/recovered from her last flare today. No pustules on her hands, but had some on the feet, which are painful.      No other skin concerns today     Left hand, shave  - Chronic eczematous dermatitis   Electronically signed by Delmar Landau, MD on 08/19/2016 at [phone]   Comment See result below    This biopsy has similarities to previous biopsies D [id number] A and B. The current biopsy has less inflammation, fewer eosinophils and less spongiosis than seen in the old biopsies.  ??  Neither of the old biopsies nor the current one is typical of psoriasis. The changes favor a chronic eczematous process. Atopic dermatitis and drug reaction should be considered. There is no evidence of pityriasis rubra pilaris.  ??  Pemphigoid seems unlikely but immunofluorescence studies may be considered to further exclude the possibility of an eczematous phase of bullous pemphigoid.  ??  The current biopsy shows no evidence of lymphoma.       Relevant History:     Pertinent PMH:    Previous treatments include:     - Hand/Foot UVB started 05/2016 (once weekly)  - Taltz 80mg  - started 02/20/16 stopped 8/17    - Trial of Blue Light alone - 11/16 x 4 (1 week apart)  - Trial of Red light MAL - 12/16 x 2 (1 week apart)    - Secukinumab 300mg  started 07/26/14 until 12/16 (tried with soriatane and with methotrexate but no improvement with either regimen)   - PDL-PDT x 3 with minimal effect   - Eximer laser x 1 - unable to continue due to cost   - Stelara: x 6-months without effect (even at 90mg )  - Remicade: history of difficulty breathing and palpitations during infusion   - Enbrel: tried x 8 months without effect   - Stelara: did not work but no adverse reaction - tried > 10 years ago as a clinical trial medication   - Humira, Humira with Soriatane, and Humira with methotrexate: did not tolerate soriatane   (  erythematous swollen hands and feet) or methotrexate (fatigue). No longer responding to humira   monotherapy (3.48yrs total therapy).   - Apremilast - 5/15 - 7/15 - ineffective and significant nausea  - light therapy: did not work (Pt thinks that she's even done PUVA)  - has not done cyclosporine. Cites kidney dysfunction.   - previous cortisone shots only thing that worked however after second shot (many years ago)   developed guillan-barre   - Dupilumab started 12/2016 and discontinued 6/18 (lack of efficacy)  - Cellcept started 4/18 (lack of efficacy)  -Dapsone and Tremfya started 6/18     -Patch tested positive for fragrance mix II, but unlikely to be clinically relevant     - Hypertension, hyperlipidemia, history of nephrolithiasis, concurrent diverticulitis, IBS and Diabetes on Insulin    Biopsy 08/27/13:    A: ??Left thenar eminence, punch  -Psoriasis (please see comment)    B: ??Left shin, punch  -Psoriasis (please see comment) ??    Comment:  Both specimens show generally similar findings, although the findings in A are more diagnostic. Sections show psoriasiform epidermal hyperplasia with a reduced granular layer, parakeratosis that contains neutrophils, and relatively thin suprapapillary plates. Superficial dermal vessels are tortuous, and there is an accompanying superficial dermal infiltrate of lymphocytes as well. In the given clinical context, the histopathologic findings are diagnostic for psoriasis. A PAS stain is negative for fungi, and a tissue Gram stain does not reveal bacteria in both specimens.     Past Medical History   Diagnosis Date   ??? Eczema    ??? Psoriasis    ??? Diabetes mellitus ([facility])    ??? Shingles    ??? Impetigo    ??? Arthritis    ??? Varicella    ??? Verruca    ??? Post herpetic neuralgia      Allergies   Allergen Reactions   ??? Influenza Virus Vaccines      Guilliane Barre   ??? Iodinated Contrast Media - Iv Dye Other (See Comments)   ??? Ioxaglate Sodium      Shaking and rapid heart beat   ??? Metrizamide      Shaking and rapid heart beat   ??? Nitrofurantoin Macrocrystalline      Vomiting and nausea   ??? Nitrofurantoin Monohyd/M-Cryst Other (See Comments)   ??? Oxycodone Other (See Comments)     >   ??? Codeine Nausea And Vomiting     Nausea and vomiting       Review of Systems:   No recent fever, chills, recent illnesses, or other skin concerns except per HPI    Objective:     Physical Exam: Patient is a well developed caucasian female in no apparent distress.  Alert and oriented x 3.  Skin: Focused examination of the patient's face, bilateral upper extremities, including hands, and feet was significant for:  -- Diffuse erythema with finescale over bilateral palms, sparing distal palmar fingers, but involving lateral fingers and web spaces. No pustules on the hands.   --Bilateral soles with milder erythema, thick overlying, and a few pustules and crusted macules.   --No fissuring of hands or feet today

## 2017-08-13 NOTE — Unmapped (Signed)
Called patient and left VM noting the CBC w diff has yet to be obtained. I asked her to call back if she would rather obtain this lab at her PCP office, [facility], or another [facility] location.

## 2017-08-19 ENCOUNTER — Encounter: Payer: Self-pay | Admitting: General Surgery

## 2017-08-19 LAB — CBC W/ DIFFERENTIAL
BANDED NEUTROPHILS ABSOLUTE COUNT: 0 10*3/uL (ref 0.0–0.1)
BASOPHILS ABSOLUTE COUNT: 0 10*3/uL (ref 0.0–0.2)
BASOPHILS RELATIVE PERCENT: 0 %
EOSINOPHILS ABSOLUTE COUNT: 0.2 10*3/uL (ref 0.0–0.4)
EOSINOPHILS RELATIVE PERCENT: 2 %
HEMATOCRIT: 40.1 % (ref 34.0–46.6)
HEMOGLOBIN: 13.5 g/dL (ref 11.1–15.9)
LYMPHOCYTES ABSOLUTE COUNT: 3 10*3/uL (ref 0.7–3.1)
LYMPHOCYTES RELATIVE PERCENT: 40 %
MEAN CORPUSCULAR HEMOGLOBIN CONC: 33.7 g/dL (ref 31.5–35.7)
MEAN CORPUSCULAR HEMOGLOBIN: 33.9 pg — ABNORMAL HIGH (ref 26.6–33.0)
MONOCYTES ABSOLUTE COUNT: 0.8 10*3/uL (ref 0.1–0.9)
MONOCYTES RELATIVE PERCENT: 10 %
NEUTROPHILS ABSOLUTE COUNT: 3.5 10*3/uL (ref 1.4–7.0)
NEUTROPHILS RELATIVE PERCENT: 48 %
PLATELET COUNT: 182 10*3/uL (ref 150–379)
RED BLOOD CELL COUNT: 3.98 x10E6/uL (ref 3.77–5.28)
RED CELL DISTRIBUTION WIDTH: 13.2 % (ref 12.3–15.4)
WHITE BLOOD CELL COUNT: 7.4 10*3/uL (ref 3.4–10.8)

## 2017-08-19 LAB — BASOPHILS RELATIVE PERCENT: Lab: 0

## 2017-08-19 NOTE — Unmapped (Signed)
Dapsone monitoring labs wnl. Ok to continue dapsone. Notified pt in mychart.

## 2017-08-22 ENCOUNTER — Encounter: Payer: Self-pay | Admitting: Cardiovascular Disease

## 2017-08-22 ENCOUNTER — Ambulatory Visit (INDEPENDENT_AMBULATORY_CARE_PROVIDER_SITE_OTHER): Payer: Medicare Other | Admitting: Cardiovascular Disease

## 2017-08-22 ENCOUNTER — Ambulatory Visit
Admission: RE | Admit: 2017-08-22 | Discharge: 2017-08-22 | Disposition: A | Discharge status: Home / Self Care | Payer: Medicare Other | Source: Ambulatory Visit | Attending: Cardiovascular Disease | Admitting: Cardiovascular Disease

## 2017-08-22 VITALS — BP 100/60 | HR 103 | Ht 63.0 in | Wt 172.0 lb

## 2017-08-22 DIAGNOSIS — I10 Essential (primary) hypertension: Secondary | ICD-10-CM | POA: Diagnosis not present

## 2017-08-22 DIAGNOSIS — I209 Angina pectoris, unspecified: Secondary | ICD-10-CM | POA: Diagnosis not present

## 2017-08-22 DIAGNOSIS — E785 Hyperlipidemia, unspecified: Secondary | ICD-10-CM

## 2017-08-22 DIAGNOSIS — Z01812 Encounter for preprocedural laboratory examination: Secondary | ICD-10-CM

## 2017-08-22 DIAGNOSIS — Z01811 Encounter for preprocedural respiratory examination: Secondary | ICD-10-CM

## 2017-08-22 MED ORDER — PREDNISONE 20 MG PO TABS: ORAL_TABLET | ORAL | 0 refills | Status: AC

## 2017-08-22 MED ORDER — METOPROLOL TARTRATE 25 MG PO TABS: 25.0000 mg | ORAL_TABLET | Freq: Two times a day (BID) | ORAL | 1 refills | Status: AC

## 2017-08-22 NOTE — Progress Notes (Signed)
Cardiology Office Note   Date:  08/22/2017   ID:  Caroline Gray, DOB [birth year], MRN [id number]  PCP:  Leone Haven, MD  Cardiologist:   Kathlyn Sacramento, MD   Chief Complaint  Patient presents with  . other    12 month follow up. Meds reviewed by the pt. verbally. Pt. c/o shortness of breath with exertion.       History of Present Illness: Caroline Gray is a 71 y.o. female who presents for a follow-up visit regarding coronary atherosclerosis and recent chest pain. She is known to have coronary atherosclerosis noted on CT scan.  She had a normal nuclear stress test in 2015. She has chronic medical conditions that include type 2 diabetes which was diagnosed in the early 90s, hypertension, hyperlipidemia and severe psoriasis. She also has family history of coronary artery disease.  Over the last few months, she has experienced consistent symptoms of substernal chest pain and tightness which only happens with physical activities. Initially it started happening with going upstairs but then it progressed to discomfort even with walking on flat level. The discomfort does not resolve until she stops her activities. No chest pain at rest. She has associated exertional dyspnea but no orthopnea, PND or leg edema.  Past Medical History:  Diagnosis Date  . Adenomatous colon polyp   . Anxiety   . Arthritis   . Cataract   . Diabetes mellitus   . Diverticulosis   . 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 History of kidney stones   . Hx: UTI (urinary tract infection)   . Hyperlipidemia   . Hypertension   . IBS (irritable bowel syndrome)   . Positive PPD   . Psoriasis     Past Surgical History:  Procedure Laterality Date  . ABDOMINAL HYSTERECTOMY    . ABDOMINAL HYSTERECTOMY    . APPENDECTOMY    . BREAST BIOPSY Right 1990   neg  . CATARACT EXTRACTION    . CHOLECYSTECTOMY    . CYSTOSCOPY WITH STENT PLACEMENT Bilateral 01/22/2017   Procedure: CYSTOSCOPY WITH STENT PLACEMENT;  Surgeon: Nickie Retort, MD;  Location: [facility];  Service: Urology;  Laterality: Bilateral;  . EYE SURGERY Bilateral    Cataract Extraction with IOL   . LITHOTRIPSY  3.7.13   Dr Jacqlyn Larsen  . ROTATOR CUFF REPAIR Right 08/2005  . SPINE SURGERY    . URETEROSCOPY WITH HOLMIUM LASER LITHOTRIPSY Bilateral 01/22/2017   Procedure: URETEROSCOPY WITH HOLMIUM LASER LITHOTRIPSY;  Surgeon: Nickie Retort, MD;  Location: [facility];  Service: Urology;  Laterality: Bilateral;     Current Outpatient Prescriptions  Medication Sig Dispense Refill  . aspirin 81 MG tablet Take 1 tablet (81 mg total) by mouth daily. 30 tablet   . calcipotriene (DOVONOX) 0.005 % cream Apply 1 application topically 2 (two) times daily.     . Calcium Citrate-Vitamin D (CALCIUM CITRATE + D PO) Take 1 capsule by mouth 2 (two) times daily.      . clobetasol ointment (TEMOVATE) 3.50 % Apply 1 application topically 2 (two) times daily.    Stasia Cavalier (EUCRISA) 2 % OINT Apply 1 application topically 2 (two) times daily.    . DULoxetine (CYMBALTA) 30 MG capsule Take 1 capsule (30 mg total) by mouth daily. 90 capsule 1  . gabapentin (NEURONTIN) 300 MG capsule Take 1 capsule (300 mg total) by mouth 3 (three) times daily. 270 capsule 1  . glucose blood test strip Test 2 times daily with  Accu-Chek monitor 100 each 3  . Guselkumab 100 MG/ML SOSY 100 mg. Once every 8 weeks    . ibuprofen (ADVIL,MOTRIN) 200 MG tablet Take 200 mg by mouth every 6 (six) hours as needed for mild pain.    . Insulin Glargine (LANTUS SOLOSTAR) 100 UNIT/ML Solostar Pen Inject 38 Units into the skin daily at 10 pm. 15 mL 4  . Insulin Pen Needle 33G X 8 MM MISC 1 application by Does not apply route daily. 100 each 11  . losartan (COZAAR) 50 MG tablet Take 1 tablet (50 mg total) by mouth daily. 90 tablet 3  . metFORMIN (GLUCOPHAGE) 1000 MG tablet Take 1 tablet (1,000 mg total) by mouth 2 (two) times daily with a meal. 180 tablet 3  . simvastatin (ZOCOR) 40 MG tablet TAKE 1 TABLET BY  MOUTH AT BEDTIME 90 tablet 1   No current facility-administered medications for this visit.     Allergies:   Codeine; Influenza vaccines; Ioxaglate; Ivp dye [iodinated diagnostic agents]; Macrodantin; and Nitrofurantoin    Social History:  The patient  reports that she quit smoking about 17 years ago. Her smoking use included Cigarettes. She quit after 15.00 years of use. She has never used smokeless tobacco. She reports that she drinks alcohol. She reports that she does not use drugs.   Family History:  The patient's family history includes Breast cancer (age of onset: 12) in her mother; Diabetes in her mother; Heart attack in her paternal aunt, paternal aunt, paternal aunt, paternal aunt, paternal uncle, paternal uncle, paternal uncle, and paternal uncle; Other in her son.    ROS:  Please see the history of present illness.   Otherwise, review of systems are positive for none.   All other systems are reviewed and negative.    PHYSICAL EXAM: VS:  BP 100/60 (BP Location: Left Arm, Patient Position: Sitting, Cuff Size: Normal)   Pulse (!) 103   Ht 5\' 3"  (1.6 m)   Wt 172 lb (78 kg)   BMI 30.47 kg/m  , BMI Body mass index is 30.47 kg/m. GEN: Well nourished, well developed, in no acute distress  HEENT: normal  Neck: no JVD, carotid bruits, or masses Cardiac: RRR; no murmurs, rubs, or gallops,no edema  Respiratory:  clear to auscultation bilaterally, normal work of breathing GI: soft, nontender, nondistended, + BS MS: no deformity or atrophy  Skin: warm and dry, no rash Neuro:  Strength and sensation are intact Psych: euthymic mood, full affect Right radial pulse is normal.  EKG:  EKG is ordered today. The ekg ordered today demonstrates sinus tachycardia with possible left atrial enlargement. No significant ST or T wave changes.   Recent Labs: 01/17/2017: Hemoglobin 13.5; Platelets 146 07/15/2017: ALT 26 07/25/2017: BUN 13; Creatinine, Ser 0.81; Potassium 3.8; Sodium 141    Lipid  Panel    Component Value Date/Time   CHOL 137 07/06/2015 0957   TRIG 142.0 07/06/2015 0957   HDL 33.30 (L) 07/06/2015 0957   CHOLHDL 4 07/06/2015 0957   VLDL 28.4 07/06/2015 0957   LDLCALC 75 07/06/2015 0957   LDLDIRECT 74.7 11/05/2013 1034      Wt Readings from Last 3 Encounters:  08/22/17 172 lb (78 kg)  07/17/17 170 lb (77.1 kg)  07/15/17 171 lb (77.6 kg)       ASSESSMENT AND PLAN:  1.   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 She  has been overall asymptomatic up until the last few months when she developed progressive exertional chest pain. Her current symptoms are suggestive of class III angina. Continue aspirin and simvastatin. I elected to switch losartan to metoprolol for its antianginal effect. Given her new symptoms and multiple risk factors, I have recommended proceeding with cardiac catheterization and possible coronary intervention. I discussed risks and benefits. She is allergic to contrast and will premedicate with prednisone.  2.  Essential hypertension: Blood pressure is controlled on losartan but I elected to switch to metoprolol given her cardiac symptoms.  3. Hyperlipidemia: Continue treatment with simvastatin. If cardiac cath shows obstructive coronary artery disease, I recommend switching to a more potent statin.   Disposition:   FU with me in 1 month.   Signed,  Kathlyn Sacramento, MD  08/22/2017 10:06 AM    [place]

## 2017-08-22 NOTE — Patient Instructions (Addendum)
Medication Instructions:  Your physician has recommended you make the following change in your medication:  STOP taking losartan START taking metoprolol 25mg  twice a day See directions for prednisone below   Labwork: BMET, CBC, PT/INR   Testing/Procedures: A chest x-ray takes a picture of the organs and structures inside the chest, including the heart, lungs, and blood vessels. This test can show several things, including, whether the heart is enlarges; whether fluid is building up in the lungs; and whether pacemaker / defibrillator leads are still in place.  Your physician has requested that you have a cardiac catheterization. Cardiac catheterization is used to diagnose and/or treat various heart conditions. Doctors may recommend this procedure for a number of different reasons. The most common reason is to evaluate chest pain. Chest pain can be a symptom of coronary artery disease (CAD), and cardiac catheterization can show whether plaque is narrowing or blocking your heart's arteries. This procedure is also used to evaluate the valves, as well as measure the blood flow and oxygen levels in different parts of your heart. For further information please visit [employer]. Please follow instruction sheet, as given.  [facility] Cardiac Cath Instructions   You are scheduled for a Cardiac Cath on:_Thursday, Oct 11__  Please arrive at 7:30am on the day of your procedure  Please expect a call from our [facility] to pre-register you  Do not eat/drink anything after midnight  Someone will need to drive you home  It is recommended someone be with you for the first 24 hours after your procedure  Wear clothes that are easy to get on/off and wear slip on shoes if possible   Medications bring a current list of all medications with you  Hold metformin 24 hours before and for 48 hours after your procedure Take prednisone, 40mg  (2 tablets) Oct 10 at 6pm and 11pm and Oct 11  at 6:30am Take a half dose of Lantus the night before your procedure  Day of your procedure: Arrive at the [facility] entrance.  Free valet service is available.  After entering the [place] please check-in at the registration desk (1st desk on your right) to receive your armband. After receiving your armband someone will escort you to the cardiac cath/special procedures waiting area.  The usual length of stay after your procedure is about 2 to 3 hours.  This can vary.  If you have any questions, please call our office at [phone], or you may call the cardiac cath lab at [facility] directly at [phone]   Follow-Up: Your physician recommends that you schedule a follow-up appointment in: 1 month with Dr. Fletcher Anon.    Any Other Special Instructions Will Be Listed Below (If Applicable).     If you need a refill on your cardiac medications before your next appointment, please call your pharmacy.   Angiogram An angiogram, also called angiography, is a procedure used to look at the blood vessels. In this procedure, dye is injected through a long, thin tube (catheter) into an artery. X-rays are then taken. The X-rays will show if there is a blockage or problem in a blood vessel. Tell a health care provider about:  Any allergies you have, including allergies to shellfish or contrast dye.  All medicines you are taking, including vitamins, herbs, eye drops, creams, and over-the-counter medicines.  Any problems you or family members have had with anesthetic medicines.  Any blood disorders you have.  Any surgeries you have had.  Any previous kidney problems  or failure you have had.  Any medical conditions you have.  Possibility of pregnancy, if this applies. What are the risks? Generally, an angiogram is a safe procedure. However, as with any procedure, problems can occur. Possible problems include:  Injury to the blood vessels, including rupture or bleeding.  Infection or  bruising at the catheter site.  Allergic reaction to the dye or contrast used.  Kidney damage from the dye or contrast used.  Blood clots that can lead to a stroke or heart attack.  What happens before the procedure?  Do not eat or drink after midnight on the night before the procedure, or as directed by your health care provider.  Ask your health care provider if you may drink enough water to take any needed medicines the morning of the procedure. What happens during the procedure?  You may be given a medicine to help you relax (sedative) before and during the procedure. This medicine is given through an IV access tube that is inserted into one of your veins.  The area where the catheter will be inserted will be washed and shaved. This is usually done in the groin but may be done in the fold of your arm (near your elbow) or in the wrist.  A medicine will be given to numb the area where the catheter will be inserted (local anesthetic).  The catheter will be inserted with a guide wire into an artery. The catheter is guided by using a type of X-ray (fluoroscopy) to the blood vessel being examined.  Dye is then injected into the catheter, and X-rays are taken. The dye helps to show where any narrowing or blockages are located. What happens after the procedure?  If the procedure is done through the leg, you will be kept in bed lying flat for several hours. You will be instructed to not bend or cross your legs.  The insertion site will be checked frequently.  The pulse in your feet or wrist will be checked frequently.  Additional blood tests, X-rays, and electrocardiography may be done.  You may need to stay in the hospital overnight for observation. This information is not intended to replace advice given to you by your health care provider. Make sure you discuss any questions you have with your health care provider. Document Released: 08/14/2005 Document Revised: 04/17/2016 Document  Reviewed: 04/07/2013 Elsevier Interactive Patient Education  2017 [place] After This sheet gives you information about how to care for yourself after your procedure. Your health care provider may also give you more specific instructions. If you have problems or questions, contact your health care provider. What can I expect after the procedure? After the procedure, it is common to have bruising and tenderness at the catheter insertion area. Follow these instructions at home: Insertion site care  Follow instructions from your health care provider about how to take care of your insertion site. Make sure you: ? Wash your hands with soap and water before you change your bandage (dressing). If soap and water are not available, use hand sanitizer. ? Change your dressing as told by your health care provider. ? Leave stitches (sutures), skin glue, or adhesive strips in place. These skin closures may need to stay in place for 2 weeks or longer. If adhesive strip edges start to loosen and curl up, you may trim the loose edges. Do not remove adhesive strips completely unless your health care provider tells you to do that.  Do not take  baths, swim, or use a hot tub until your health care provider approves.  You may shower 24-48 hours after the procedure or as told by your health care provider. ? Gently wash the site with plain soap and water. ? Pat the area dry with a clean towel. ? Do not rub the site. This may cause bleeding.  Do not apply powder or lotion to the site. Keep the site clean and dry.  Check your insertion site every day for signs of infection. Check for: ? Redness, swelling, or pain. ? Fluid or blood. ? Warmth. ? Pus or a bad smell. Activity  Rest as told by your health care provider, usually for 1-2 days.  Do not lift anything that is heavier than 10 lbs. (4.5 kg) or as told by your health care provider.  Do not drive for 24 hours if you were given a  medicine to help you relax (sedative).  Do not drive or use heavy machinery while taking prescription pain medicine. General instructions  Return to your normal activities as told by your health care provider, usually in about a week. Ask your health care provider what activities are safe for you.  If the catheter site starts bleeding, lie flat and put pressure on the site. If the bleeding does not stop, get help right away. This is a medical emergency.  Drink enough fluid to keep your urine clear or pale yellow. This helps flush the contrast dye from your body.  Take over-the-counter and prescription medicines only as told by your health care provider.  Keep all follow-up visits as told by your health care provider. This is important. Contact a health care provider if:  You have a fever or chills.  You have redness, swelling, or pain around your insertion site.  You have fluid or blood coming from your insertion site.  The insertion site feels warm to the touch.  You have pus or a bad smell coming from your insertion site.  You have bruising around the insertion site.  You notice blood collecting in the tissue around the catheter site (hematoma). The hematoma may be painful to the touch. Get help right away if:  You have severe pain at the catheter insertion area.  The catheter insertion area swells very fast.  The catheter insertion area is bleeding, and the bleeding does not stop when you hold steady pressure on the area.  The area near or just beyond the catheter insertion site becomes pale, cool, tingly, or numb. These symptoms may represent a serious problem that is an emergency. Do not wait to see if the symptoms will go away. Get medical help right away. Call your local emergency services (911 in the [place] Do not drive yourself to the hospital. Summary  After the procedure, it is common to have bruising and tenderness at the catheter insertion area.  After the  procedure, it is important to rest and drink plenty of fluids.  Do not take baths, swim, or use a hot tub until your health care provider says it is okay to do so. You may shower 24-48 hours after the procedure or as told by your health care provider.  If the catheter site starts bleeding, lie flat and put pressure on the site. If the bleeding does not stop, get help right away. This is a medical emergency. This information is not intended to replace advice given to you by your health care provider. Make sure you discuss any questions you have with  your health care provider. Document Released: 05/23/2005 Document Revised: 10/09/2016 Document Reviewed: 10/09/2016 Elsevier Interactive Patient Education  2017 [employer].

## 2017-08-23 LAB — BASIC METABOLIC PANEL
BUN/Creatinine Ratio: 13 (ref 12–28)
BUN: 11 mg/dL (ref 8–27)
CALCIUM: 8.9 mg/dL (ref 8.7–10.3)
CHLORIDE: 97 mmol/L (ref 96–106)
CO2: 23 mmol/L (ref 20–29)
Creatinine, Ser: 0.83 mg/dL (ref 0.57–1.00)
GFR calc non Af Amer: 70 mL/min/{1.73_m2} (ref >59)
GFR, EST AFRICAN AMERICAN: 81 mL/min/{1.73_m2} (ref >59)
Glucose: 200 mg/dL — ABNORMAL HIGH (ref 65–99)
POTASSIUM: 4.4 mmol/L (ref 3.5–5.2)
Sodium: 137 mmol/L (ref 134–144)

## 2017-08-23 LAB — CBC
HEMATOCRIT: 39.2 % (ref 34.0–46.6)
HEMOGLOBIN: 12.9 g/dL (ref 11.1–15.9)
MCH: 32.9 pg (ref 26.6–33.0)
MCHC: 32.9 g/dL (ref 31.5–35.7)
MCV: 100 fL — AB (ref 79–97)
Platelets: 169 10*3/uL (ref 150–379)
RBC: 3.92 x10E6/uL (ref 3.77–5.28)
RDW: 13.1 % (ref 12.3–15.4)
WBC: 6.6 10*3/uL (ref 3.4–10.8)

## 2017-08-23 LAB — PROTIME-INR
INR: 1.2 (ref 0.8–1.2)
PROTHROMBIN TIME: 12.2 s — AB (ref 9.1–12.0)

## 2017-08-25 ENCOUNTER — Encounter: Payer: Self-pay | Admitting: Family Medicine

## 2017-08-28 ENCOUNTER — Inpatient Hospital Stay (HOSPITAL_COMMUNITY): Payer: Medicare Other

## 2017-08-28 ENCOUNTER — Encounter: Payer: Self-pay | Admitting: *Deleted

## 2017-08-28 ENCOUNTER — Inpatient Hospital Stay (HOSPITAL_COMMUNITY)
Admission: EM | Admit: 2017-08-28 | Discharge: 2017-09-18 | DRG: 233 | Disposition: E | Payer: Medicare Other | Source: Other Acute Inpatient Hospital | Attending: Surgery | Admitting: Surgery

## 2017-08-28 ENCOUNTER — Inpatient Hospital Stay (HOSPITAL_COMMUNITY): Payer: Medicare Other | Admitting: Certified Registered Nurse Anesthetist

## 2017-08-28 ENCOUNTER — Other Ambulatory Visit: Payer: Self-pay | Admitting: Surgery

## 2017-08-28 ENCOUNTER — Inpatient Hospital Stay (HOSPITAL_COMMUNITY): Admission: EM | Disposition: E | Payer: Self-pay | Source: Other Acute Inpatient Hospital | Attending: Surgery

## 2017-08-28 ENCOUNTER — Encounter
Admission: RE | Disposition: A | Discharge status: Other Acute Inpatient Hospital | Payer: Self-pay | Source: Ambulatory Visit | Attending: Cardiovascular Disease

## 2017-08-28 ENCOUNTER — Ambulatory Visit
Admission: RE | Admit: 2017-08-28 | Discharge: 2017-08-28 | Disposition: A | Discharge status: Other Acute Inpatient Hospital | Payer: Medicare Other | Source: Ambulatory Visit | Attending: Cardiovascular Disease | Admitting: Cardiovascular Disease

## 2017-08-28 DIAGNOSIS — M1990 Unspecified osteoarthritis, unspecified site: Secondary | ICD-10-CM | POA: Insufficient documentation

## 2017-08-28 DIAGNOSIS — I9789 Other postprocedural complications and disorders of the circulatory system, not elsewhere classified: Secondary | ICD-10-CM | POA: Diagnosis not present

## 2017-08-28 DIAGNOSIS — Z7982 Long term (current) use of aspirin: Secondary | ICD-10-CM

## 2017-08-28 DIAGNOSIS — G9340 Encephalopathy, unspecified: Secondary | ICD-10-CM | POA: Diagnosis present

## 2017-08-28 DIAGNOSIS — E119 Type 2 diabetes mellitus without complications: Secondary | ICD-10-CM | POA: Diagnosis not present

## 2017-08-28 DIAGNOSIS — I2542 Coronary artery dissection: Secondary | ICD-10-CM | POA: Diagnosis present

## 2017-08-28 DIAGNOSIS — K5790 Diverticulosis of intestine, part unspecified, without perforation or abscess without bleeding: Secondary | ICD-10-CM | POA: Diagnosis present

## 2017-08-28 DIAGNOSIS — I7100 Dissection of unspecified site of aorta: Secondary | ICD-10-CM

## 2017-08-28 DIAGNOSIS — Z86010 Personal history of colonic polyps: Secondary | ICD-10-CM | POA: Diagnosis not present

## 2017-08-28 DIAGNOSIS — F419 Anxiety disorder, unspecified: Secondary | ICD-10-CM | POA: Diagnosis present

## 2017-08-28 DIAGNOSIS — L409 Psoriasis, unspecified: Secondary | ICD-10-CM | POA: Diagnosis present

## 2017-08-28 DIAGNOSIS — Z961 Presence of intraocular lens: Secondary | ICD-10-CM | POA: Diagnosis present

## 2017-08-28 DIAGNOSIS — J9601 Acute respiratory failure with hypoxia: Secondary | ICD-10-CM | POA: Diagnosis not present

## 2017-08-28 DIAGNOSIS — R570 Cardiogenic shock: Secondary | ICD-10-CM | POA: Diagnosis not present

## 2017-08-28 DIAGNOSIS — I7102 Dissection of abdominal aorta: Secondary | ICD-10-CM | POA: Diagnosis not present

## 2017-08-28 DIAGNOSIS — Z79899 Other long term (current) drug therapy: Secondary | ICD-10-CM | POA: Diagnosis not present

## 2017-08-28 DIAGNOSIS — Z9842 Cataract extraction status, left eye: Secondary | ICD-10-CM

## 2017-08-28 DIAGNOSIS — E785 Hyperlipidemia, unspecified: Secondary | ICD-10-CM | POA: Insufficient documentation

## 2017-08-28 DIAGNOSIS — Z87891 Personal history of nicotine dependence: Secondary | ICD-10-CM | POA: Insufficient documentation

## 2017-08-28 DIAGNOSIS — I2119 ST elevation (STEMI) myocardial infarction involving other coronary artery of inferior wall: Secondary | ICD-10-CM | POA: Diagnosis present

## 2017-08-28 DIAGNOSIS — I313 Pericardial effusion (noninflammatory): Secondary | ICD-10-CM | POA: Diagnosis not present

## 2017-08-28 DIAGNOSIS — R109 Unspecified abdominal pain: Secondary | ICD-10-CM

## 2017-08-28 DIAGNOSIS — E872 Acidosis: Secondary | ICD-10-CM | POA: Diagnosis not present

## 2017-08-28 DIAGNOSIS — I469 Cardiac arrest, cause unspecified: Secondary | ICD-10-CM

## 2017-08-28 DIAGNOSIS — Z9049 Acquired absence of other specified parts of digestive tract: Secondary | ICD-10-CM | POA: Diagnosis not present

## 2017-08-28 DIAGNOSIS — Z8249 Family history of ischemic heart disease and other diseases of the circulatory system: Secondary | ICD-10-CM

## 2017-08-28 DIAGNOSIS — I25119 Atherosclerotic heart disease of native coronary artery with unspecified angina pectoris: Secondary | ICD-10-CM | POA: Insufficient documentation

## 2017-08-28 DIAGNOSIS — I4891 Unspecified atrial fibrillation: Secondary | ICD-10-CM | POA: Diagnosis not present

## 2017-08-28 DIAGNOSIS — Z9841 Cataract extraction status, right eye: Secondary | ICD-10-CM

## 2017-08-28 DIAGNOSIS — Z794 Long term (current) use of insulin: Secondary | ICD-10-CM | POA: Insufficient documentation

## 2017-08-28 DIAGNOSIS — I2510 Atherosclerotic heart disease of native coronary artery without angina pectoris: Secondary | ICD-10-CM | POA: Diagnosis present

## 2017-08-28 DIAGNOSIS — J9811 Atelectasis: Secondary | ICD-10-CM | POA: Diagnosis not present

## 2017-08-28 DIAGNOSIS — I119 Hypertensive heart disease without heart failure: Secondary | ICD-10-CM | POA: Diagnosis present

## 2017-08-28 DIAGNOSIS — K55069 Acute infarction of intestine, part and extent unspecified: Secondary | ICD-10-CM | POA: Diagnosis not present

## 2017-08-28 DIAGNOSIS — Z833 Family history of diabetes mellitus: Secondary | ICD-10-CM

## 2017-08-28 DIAGNOSIS — Z803 Family history of malignant neoplasm of breast: Secondary | ICD-10-CM

## 2017-08-28 DIAGNOSIS — Z66 Do not resuscitate: Secondary | ICD-10-CM | POA: Diagnosis not present

## 2017-08-28 DIAGNOSIS — I442 Atrioventricular block, complete: Secondary | ICD-10-CM

## 2017-08-28 DIAGNOSIS — J810 Acute pulmonary edema: Secondary | ICD-10-CM | POA: Diagnosis not present

## 2017-08-28 DIAGNOSIS — Z87442 Personal history of urinary calculi: Secondary | ICD-10-CM | POA: Diagnosis not present

## 2017-08-28 DIAGNOSIS — K589 Irritable bowel syndrome without diarrhea: Secondary | ICD-10-CM | POA: Insufficient documentation

## 2017-08-28 DIAGNOSIS — F329 Major depressive disorder, single episode, unspecified: Secondary | ICD-10-CM | POA: Diagnosis present

## 2017-08-28 DIAGNOSIS — D62 Acute posthemorrhagic anemia: Secondary | ICD-10-CM | POA: Diagnosis not present

## 2017-08-28 DIAGNOSIS — R4689 Other symptoms and signs involving appearance and behavior: Secondary | ICD-10-CM

## 2017-08-28 DIAGNOSIS — E1136 Type 2 diabetes mellitus with diabetic cataract: Secondary | ICD-10-CM | POA: Diagnosis present

## 2017-08-28 DIAGNOSIS — Z90710 Acquired absence of both cervix and uterus: Secondary | ICD-10-CM | POA: Diagnosis not present

## 2017-08-28 DIAGNOSIS — R571 Hypovolemic shock: Secondary | ICD-10-CM | POA: Diagnosis not present

## 2017-08-28 DIAGNOSIS — Z887 Allergy status to serum and vaccine status: Secondary | ICD-10-CM

## 2017-08-28 DIAGNOSIS — Z91041 Radiographic dye allergy status: Secondary | ICD-10-CM

## 2017-08-28 DIAGNOSIS — Z888 Allergy status to other drugs, medicaments and biological substances status: Secondary | ICD-10-CM

## 2017-08-28 DIAGNOSIS — I10 Essential (primary) hypertension: Secondary | ICD-10-CM | POA: Diagnosis not present

## 2017-08-28 DIAGNOSIS — I361 Nonrheumatic tricuspid (valve) insufficiency: Secondary | ICD-10-CM | POA: Diagnosis not present

## 2017-08-28 DIAGNOSIS — Z87440 Personal history of urinary (tract) infections: Secondary | ICD-10-CM

## 2017-08-28 DIAGNOSIS — I209 Angina pectoris, unspecified: Secondary | ICD-10-CM

## 2017-08-28 DIAGNOSIS — Z885 Allergy status to narcotic agent status: Secondary | ICD-10-CM

## 2017-08-28 DIAGNOSIS — D696 Thrombocytopenia, unspecified: Secondary | ICD-10-CM | POA: Diagnosis not present

## 2017-08-28 DIAGNOSIS — Z951 Presence of aortocoronary bypass graft: Secondary | ICD-10-CM

## 2017-08-28 DIAGNOSIS — N179 Acute kidney failure, unspecified: Secondary | ICD-10-CM | POA: Diagnosis present

## 2017-08-28 DIAGNOSIS — E8770 Fluid overload, unspecified: Secondary | ICD-10-CM | POA: Diagnosis not present

## 2017-08-28 DIAGNOSIS — I350 Nonrheumatic aortic (valve) stenosis: Secondary | ICD-10-CM | POA: Diagnosis not present

## 2017-08-28 DIAGNOSIS — Z9289 Personal history of other medical treatment: Secondary | ICD-10-CM

## 2017-08-28 HISTORY — PX: LEFT HEART CATH AND CORONARY ANGIOGRAPHY: CATH118249

## 2017-08-28 HISTORY — PX: BENTALL PROCEDURE: SHX5058

## 2017-08-28 HISTORY — DX: Angina pectoris, unspecified: I20.9

## 2017-08-28 HISTORY — PX: CORONARY ARTERY BYPASS GRAFT: SHX141

## 2017-08-28 HISTORY — PX: INTRAOPERATIVE TRANSESOPHAGEAL ECHOCARDIOGRAM: SHX5062

## 2017-08-28 HISTORY — PX: INTRAVASCULAR PRESSURE WIRE/FFR STUDY: CATH118243

## 2017-08-28 LAB — CBC
HCT: 24.2 % — ABNORMAL LOW (ref 36.0–46.0)
HEMATOCRIT: 35.2 % — AB (ref 36.0–46.0)
Hemoglobin: 11.4 g/dL — ABNORMAL LOW (ref 12.0–15.0)
Hemoglobin: 8.2 g/dL — ABNORMAL LOW (ref 12.0–15.0)
MCH: 32.1 pg (ref 26.0–34.0)
MCH: 32.9 pg (ref 26.0–34.0)
MCHC: 32.4 g/dL (ref 30.0–36.0)
MCHC: 33.9 g/dL (ref 30.0–36.0)
MCV: 99.2 fL (ref 78.0–100.0)
MCV: 97.2 fL (ref 78.0–100.0)
PLATELETS: 114 10*3/uL — AB (ref 150–400)
PLATELETS: 63 10*3/uL — AB (ref 150–400)
RBC: 3.55 MIL/uL — ABNORMAL LOW (ref 3.87–5.11)
RBC: 2.49 MIL/uL — AB (ref 3.87–5.11)
RDW: 14.7 % (ref 11.5–15.5)
RDW: 15.4 % (ref 11.5–15.5)
WBC: 28.3 10*3/uL — AB (ref 4.0–10.5)
WBC: 19.9 10*3/uL — ABNORMAL HIGH (ref 4.0–10.5)

## 2017-08-28 LAB — POCT I-STAT, CHEM 8
BUN: 16 mg/dL (ref 6–20)
BUN: 15 mg/dL (ref 6–20)
BUN: 14 mg/dL (ref 6–20)
BUN: 15 mg/dL (ref 6–20)
BUN: 14 mg/dL (ref 6–20)
CALCIUM ION: 1.12 mmol/L — AB (ref 1.15–1.40)
CALCIUM ION: 1.17 mmol/L (ref 1.15–1.40)
CHLORIDE: 101 mmol/L (ref 101–111)
CHLORIDE: 102 mmol/L (ref 101–111)
CREATININE: 0.7 mg/dL (ref 0.44–1.00)
CREATININE: 0.7 mg/dL (ref 0.44–1.00)
CREATININE: 0.6 mg/dL (ref 0.44–1.00)
CREATININE: 0.6 mg/dL (ref 0.44–1.00)
CREATININE: 0.6 mg/dL (ref 0.44–1.00)
Calcium, Ion: 1.09 mmol/L — ABNORMAL LOW (ref 1.15–1.40)
Calcium, Ion: 0.89 mmol/L — CL (ref 1.15–1.40)
Calcium, Ion: 0.94 mmol/L — ABNORMAL LOW (ref 1.15–1.40)
Chloride: 103 mmol/L (ref 101–111)
Chloride: 103 mmol/L (ref 101–111)
Chloride: 100 mmol/L — ABNORMAL LOW (ref 101–111)
GLUCOSE: 255 mg/dL — AB (ref 65–99)
GLUCOSE: 282 mg/dL — AB (ref 65–99)
Glucose, Bld: 260 mg/dL — ABNORMAL HIGH (ref 65–99)
Glucose, Bld: 226 mg/dL — ABNORMAL HIGH (ref 65–99)
Glucose, Bld: 233 mg/dL — ABNORMAL HIGH (ref 65–99)
HCT: 36 % (ref 36.0–46.0)
HCT: 33 % — ABNORMAL LOW (ref 36.0–46.0)
HCT: 24 % — ABNORMAL LOW (ref 36.0–46.0)
HCT: 23 % — ABNORMAL LOW (ref 36.0–46.0)
HEMATOCRIT: 25 % — AB (ref 36.0–46.0)
HEMOGLOBIN: 12.2 g/dL (ref 12.0–15.0)
HEMOGLOBIN: 8.2 g/dL — AB (ref 12.0–15.0)
Hemoglobin: 11.2 g/dL — ABNORMAL LOW (ref 12.0–15.0)
Hemoglobin: 8.5 g/dL — ABNORMAL LOW (ref 12.0–15.0)
Hemoglobin: 7.8 g/dL — ABNORMAL LOW (ref 12.0–15.0)
POTASSIUM: 5.1 mmol/L (ref 3.5–5.1)
Potassium: 4.2 mmol/L (ref 3.5–5.1)
Potassium: 4.1 mmol/L (ref 3.5–5.1)
Potassium: 3.9 mmol/L (ref 3.5–5.1)
Potassium: 4.5 mmol/L (ref 3.5–5.1)
SODIUM: 140 mmol/L (ref 135–145)
Sodium: 141 mmol/L (ref 135–145)
Sodium: 140 mmol/L (ref 135–145)
Sodium: 137 mmol/L (ref 135–145)
Sodium: 137 mmol/L (ref 135–145)
TCO2: 26 mmol/L (ref 22–32)
TCO2: 23 mmol/L (ref 22–32)
TCO2: 26 mmol/L (ref 22–32)
TCO2: 23 mmol/L (ref 22–32)
TCO2: 23 mmol/L (ref 22–32)

## 2017-08-28 LAB — POCT I-STAT 3, ART BLOOD GAS (G3+)
ACID-BASE DEFICIT: 8 mmol/L — AB (ref 0.0–2.0)
ACID-BASE DEFICIT: 4 mmol/L — AB (ref 0.0–2.0)
ACID-BASE EXCESS: 2 mmol/L (ref 0.0–2.0)
BICARBONATE: 26 mmol/L (ref 20.0–28.0)
BICARBONATE: 22.1 mmol/L (ref 20.0–28.0)
Bicarbonate: 20.3 mmol/L (ref 20.0–28.0)
O2 SAT: 100 %
O2 SAT: 96 %
O2 Saturation: 99 %
PH ART: 7.338 — AB (ref 7.350–7.450)
PO2 ART: 373 mmHg — AB (ref 83.0–108.0)
PO2 ART: 99 mmHg (ref 83.0–108.0)
TCO2: 27 mmol/L (ref 22–32)
TCO2: 22 mmol/L (ref 22–32)
TCO2: 23 mmol/L (ref 22–32)
pCO2 arterial: 38.8 mmHg (ref 32.0–48.0)
pCO2 arterial: 50.3 mmHg — ABNORMAL HIGH (ref 32.0–48.0)
pCO2 arterial: 41 mmHg (ref 32.0–48.0)
pH, Arterial: 7.434 (ref 7.350–7.450)
pH, Arterial: 7.212 — ABNORMAL LOW (ref 7.350–7.450)
pO2, Arterial: 150 mmHg — ABNORMAL HIGH (ref 83.0–108.0)

## 2017-08-28 LAB — GLUCOSE, CAPILLARY
GLUCOSE-CAPILLARY: 200 mg/dL — AB (ref 65–99)
GLUCOSE-CAPILLARY: 150 mg/dL — AB (ref 65–99)
GLUCOSE-CAPILLARY: 135 mg/dL — AB (ref 65–99)
Glucose-Capillary: 222 mg/dL — ABNORMAL HIGH (ref 65–99)
Glucose-Capillary: 143 mg/dL — ABNORMAL HIGH (ref 65–99)
Glucose-Capillary: 148 mg/dL — ABNORMAL HIGH (ref 65–99)

## 2017-08-28 LAB — BASIC METABOLIC PANEL
Anion gap: 7 (ref 5–15)
BUN: 12 mg/dL (ref 6–20)
CALCIUM: 7.3 mg/dL — AB (ref 8.9–10.3)
CO2: 22 mmol/L (ref 22–32)
CREATININE: 0.83 mg/dL (ref 0.44–1.00)
Chloride: 109 mmol/L (ref 101–111)
GLUCOSE: 146 mg/dL — AB (ref 65–99)
Potassium: 4.4 mmol/L (ref 3.5–5.1)
Sodium: 138 mmol/L (ref 135–145)

## 2017-08-28 LAB — POCT I-STAT 4, (NA,K, GLUC, HGB,HCT)
GLUCOSE: 215 mg/dL — AB (ref 65–99)
HEMATOCRIT: 33 % — AB (ref 36.0–46.0)
HEMOGLOBIN: 11.2 g/dL — AB (ref 12.0–15.0)
POTASSIUM: 4.4 mmol/L (ref 3.5–5.1)
SODIUM: 140 mmol/L (ref 135–145)

## 2017-08-28 LAB — PREPARE RBC (CROSSMATCH)

## 2017-08-28 LAB — FIBRINOGEN: FIBRINOGEN: 242 mg/dL (ref 210–475)

## 2017-08-28 LAB — HEMOGLOBIN AND HEMATOCRIT, BLOOD
HCT: 23.4 % — ABNORMAL LOW (ref 36.0–46.0)
Hemoglobin: 7.5 g/dL — ABNORMAL LOW (ref 12.0–15.0)

## 2017-08-28 LAB — ABO/RH: ABO/RH(D): O POS

## 2017-08-28 LAB — MAGNESIUM: MAGNESIUM: 2.9 mg/dL — AB (ref 1.7–2.4)

## 2017-08-28 LAB — APTT: APTT: 47 s — AB (ref 24–36)

## 2017-08-28 LAB — PLATELET COUNT: PLATELETS: 137 10*3/uL — AB (ref 150–400)

## 2017-08-28 LAB — PROTIME-INR
INR: 1.66
Prothrombin Time: 19.5 seconds — ABNORMAL HIGH (ref 11.4–15.2)

## 2017-08-28 SURGERY — BENTALL PROCEDURE
Anesthesia: General | Site: Chest

## 2017-08-28 SURGERY — LEFT HEART CATH AND CORONARY ANGIOGRAPHY
Anesthesia: Moderate Sedation

## 2017-08-28 MED ORDER — NITROGLYCERIN IN D5W 200-5 MCG/ML-% IV SOLN
2.0000 ug/min | INTRAVENOUS | Status: AC
  Administered 2017-08-28: 16.6 ug/min via INTRAVENOUS
  Filled 2017-08-28: qty 250

## 2017-08-28 MED ORDER — PROTAMINE SULFATE 10 MG/ML IV SOLN
INTRAVENOUS | Status: DC | PRN
  Administered 2017-08-28: 250 mg via INTRAVENOUS

## 2017-08-28 MED ORDER — DULOXETINE HCL 30 MG PO CPEP
30.0000 mg | ORAL_CAPSULE | Freq: Every day | ORAL | Status: DC
  Administered 2017-08-30 – 2017-09-01 (×3): 30 mg via ORAL
  Filled 2017-08-28 (×5): qty 1

## 2017-08-28 MED ORDER — SODIUM CHLORIDE 0.9 % IV SOLN
0.0000 ug/min | INTRAVENOUS | Status: DC
  Administered 2017-08-28 – 2017-08-29 (×2): 65 ug/min via INTRAVENOUS
  Filled 2017-08-28 (×3): qty 2

## 2017-08-28 MED ORDER — ADENOSINE (DIAGNOSTIC) 3 MG/ML IV SOLN
INTRAVENOUS | Status: AC
  Filled 2017-08-28: qty 30

## 2017-08-28 MED ORDER — PANTOPRAZOLE SODIUM 40 MG PO TBEC
40.0000 mg | DELAYED_RELEASE_TABLET | Freq: Every day | ORAL | Status: DC
  Administered 2017-08-30 – 2017-09-01 (×3): 40 mg via ORAL
  Filled 2017-08-28 (×3): qty 1

## 2017-08-28 MED ORDER — METOPROLOL TARTRATE 5 MG/5ML IV SOLN: 2.5000 mg | INTRAVENOUS | Status: DC | PRN

## 2017-08-28 MED ORDER — DOCUSATE SODIUM 100 MG PO CAPS
200.0000 mg | ORAL_CAPSULE | Freq: Every day | ORAL | Status: DC
  Administered 2017-08-29 – 2017-09-01 (×4): 200 mg via ORAL
  Filled 2017-08-28 (×4): qty 2

## 2017-08-28 MED ORDER — ASPIRIN 81 MG PO CHEW: 324.0000 mg | CHEWABLE_TABLET | Freq: Every day | ORAL | Status: DC

## 2017-08-28 MED ORDER — ALBUMIN HUMAN 5 % IV SOLN
250.0000 mL | INTRAVENOUS | Status: AC | PRN
  Administered 2017-08-28 (×4): 250 mL via INTRAVENOUS
  Filled 2017-08-28 (×2): qty 250

## 2017-08-28 MED ORDER — PHENYLEPHRINE HCL 10 MG/ML IJ SOLN: INTRAVENOUS | Status: DC | PRN

## 2017-08-28 MED ORDER — ACETAMINOPHEN 500 MG PO TABS
1000.0000 mg | ORAL_TABLET | Freq: Four times a day (QID) | ORAL | Status: DC
  Administered 2017-08-29 – 2017-09-01 (×13): 1000 mg via ORAL
  Filled 2017-08-28 (×14): qty 2

## 2017-08-28 MED ORDER — HEPARIN SODIUM (PORCINE) 1000 UNIT/ML IJ SOLN
INTRAMUSCULAR | Status: DC | PRN
  Administered 2017-08-28: 23000 [IU] via INTRAVENOUS

## 2017-08-28 MED ORDER — ASPIRIN 81 MG PO CHEW
CHEWABLE_TABLET | ORAL | Status: AC
Start: 2017-08-28 — End: 2017-08-28
  Administered 2017-08-28: 81 mg
  Filled 2017-08-28: qty 1

## 2017-08-28 MED ORDER — SODIUM CHLORIDE 0.9 % IV SOLN
INTRAVENOUS | Status: DC | PRN
  Administered 2017-08-28: 4 [IU]/h via INTRAVENOUS

## 2017-08-28 MED ORDER — SODIUM CHLORIDE 0.9% FLUSH
3.0000 mL | Freq: Two times a day (BID) | INTRAVENOUS | Status: DC
  Administered 2017-08-29 – 2017-09-02 (×9): 3 mL via INTRAVENOUS

## 2017-08-28 MED ORDER — GABAPENTIN 300 MG PO CAPS
300.0000 mg | ORAL_CAPSULE | Freq: Three times a day (TID) | ORAL | Status: DC
  Administered 2017-08-30 – 2017-09-01 (×9): 300 mg via ORAL
  Filled 2017-08-28 (×8): qty 1
  Filled 2017-08-28: qty 3
  Filled 2017-08-28: qty 1

## 2017-08-28 MED ORDER — PHENYLEPHRINE HCL 10 MG/ML IJ SOLN
INTRAMUSCULAR | Status: DC | PRN
  Administered 2017-08-28: 80 ug via INTRAVENOUS
  Administered 2017-08-28 (×2): 40 ug via INTRAVENOUS
  Administered 2017-08-28 (×2): 80 ug via INTRAVENOUS

## 2017-08-28 MED ORDER — PHENYLEPHRINE HCL 10 MG/ML IJ SOLN
INTRAVENOUS | Status: DC | PRN
  Administered 2017-08-28: 20 ug/min via INTRAVENOUS

## 2017-08-28 MED ORDER — SODIUM CHLORIDE 0.45 % IV SOLN: INTRAVENOUS | Status: DC | PRN

## 2017-08-28 MED ORDER — THROMBIN 20000 UNITS EX SOLR
OROMUCOSAL | Status: DC | PRN
  Administered 2017-08-28 (×4): via TOPICAL

## 2017-08-28 MED ORDER — MIDAZOLAM HCL 2 MG/2ML IJ SOLN
2.0000 mg | INTRAMUSCULAR | Status: DC | PRN
  Administered 2017-08-28: 2 mg via INTRAVENOUS
  Filled 2017-08-28: qty 2

## 2017-08-28 MED ORDER — NITROGLYCERIN IN D5W 200-5 MCG/ML-% IV SOLN: 0.0000 ug/min | INTRAVENOUS | Status: DC

## 2017-08-28 MED ORDER — MORPHINE SULFATE (PF) 4 MG/ML IV SOLN
2.0000 mg | INTRAVENOUS | Status: DC | PRN
  Administered 2017-09-01: 4 mg via INTRAVENOUS
  Filled 2017-08-28 (×2): qty 1

## 2017-08-28 MED ORDER — SODIUM CHLORIDE 0.9% FLUSH
3.0000 mL | Freq: Two times a day (BID) | INTRAVENOUS | Status: DC
Start: 2017-08-28 — End: 2017-08-28

## 2017-08-28 MED ORDER — ATROPINE SULFATE 0.4 MG/ML IJ SOLN
INTRAMUSCULAR | Status: DC | PRN
  Administered 2017-08-28 (×3): 0.4 mg via INTRAVENOUS

## 2017-08-28 MED ORDER — SODIUM CHLORIDE 0.9 % IV SOLN: 10.0000 mL/h | Freq: Once | INTRAVENOUS | Status: DC

## 2017-08-28 MED ORDER — INSULIN REGULAR BOLUS VIA INFUSION
0.0000 [IU] | Freq: Three times a day (TID) | INTRAVENOUS | Status: DC
  Administered 2017-08-29 (×2): 3 [IU] via INTRAVENOUS
  Filled 2017-08-28: qty 10

## 2017-08-28 MED ORDER — TRANEXAMIC ACID 1000 MG/10ML IV SOLN
1.5000 mg/kg/h | INTRAVENOUS | Status: AC
  Administered 2017-08-28: 1.5 mg/kg/h via INTRAVENOUS
  Filled 2017-08-28: qty 25

## 2017-08-28 MED ORDER — DEXTROSE 5 % IV SOLN
1.5000 g | Freq: Two times a day (BID) | INTRAVENOUS | Status: AC
  Administered 2017-08-28 – 2017-08-30 (×4): 1.5 g via INTRAVENOUS
  Filled 2017-08-28 (×4): qty 1.5

## 2017-08-28 MED ORDER — METOPROLOL TARTRATE 12.5 MG HALF TABLET: 12.5000 mg | ORAL_TABLET | Freq: Two times a day (BID) | ORAL | Status: DC

## 2017-08-28 MED ORDER — SODIUM CHLORIDE 0.9 % IV SOLN
INTRAVENOUS | Status: DC
  Filled 2017-08-28: qty 30

## 2017-08-28 MED ORDER — ACETAMINOPHEN 650 MG RE SUPP
650.0000 mg | Freq: Once | RECTAL | Status: AC
Start: 2017-08-28 — End: 2017-08-28
  Administered 2017-08-28: 650 mg via RECTAL

## 2017-08-28 MED ORDER — DOPAMINE-DEXTROSE 3.2-5 MG/ML-% IV SOLN
INTRAVENOUS | Status: AC
  Filled 2017-08-28: qty 250

## 2017-08-28 MED ORDER — METOPROLOL TARTRATE 25 MG/10 ML ORAL SUSPENSION: 12.5000 mg | Freq: Two times a day (BID) | ORAL | Status: DC

## 2017-08-28 MED ORDER — LACTATED RINGERS IV SOLN
INTRAVENOUS | Status: DC | PRN
  Administered 2017-08-28: 12:00:00 via INTRAVENOUS

## 2017-08-28 MED ORDER — SODIUM CHLORIDE 0.9 % IV SOLN: 250.0000 mL | INTRAVENOUS | Status: DC | PRN

## 2017-08-28 MED ORDER — CHLORHEXIDINE GLUCONATE 0.12% ORAL RINSE (MEDLINE KIT)
15.0000 mL | Freq: Two times a day (BID) | OROMUCOSAL | Status: DC
Start: 2017-08-29 — End: 2017-08-31
  Administered 2017-08-29 – 2017-08-30 (×4): 15 mL via OROMUCOSAL

## 2017-08-28 MED ORDER — LACTATED RINGERS IV SOLN
INTRAVENOUS | Status: DC | PRN
  Administered 2017-08-28: 11:00:00 via INTRAVENOUS

## 2017-08-28 MED ORDER — SODIUM CHLORIDE 0.9% FLUSH: 3.0000 mL | INTRAVENOUS | Status: DC | PRN

## 2017-08-28 MED ORDER — LIDOCAINE HCL (CARDIAC) 20 MG/ML IV SOLN
INTRAVENOUS | Status: DC | PRN
  Administered 2017-08-28: 100 mg via INTRAVENOUS

## 2017-08-28 MED ORDER — ASPIRIN 81 MG PO CHEW: 81.0000 mg | CHEWABLE_TABLET | ORAL | Status: DC

## 2017-08-28 MED ORDER — BISACODYL 5 MG PO TBEC
10.0000 mg | DELAYED_RELEASE_TABLET | Freq: Every day | ORAL | Status: DC
  Administered 2017-08-29 – 2017-08-31 (×3): 10 mg via ORAL
  Filled 2017-08-28 (×3): qty 2

## 2017-08-28 MED ORDER — METOCLOPRAMIDE HCL 5 MG/ML IJ SOLN
10.0000 mg | Freq: Four times a day (QID) | INTRAMUSCULAR | Status: AC
  Administered 2017-08-29 – 2017-08-30 (×2): 10 mg via INTRAVENOUS
  Filled 2017-08-28 (×3): qty 2

## 2017-08-28 MED ORDER — DIPHENHYDRAMINE HCL 50 MG/ML IJ SOLN
INTRAMUSCULAR | Status: AC
  Administered 2017-08-28: 50 mg via INTRAVENOUS
  Filled 2017-08-28: qty 1

## 2017-08-28 MED ORDER — VERAPAMIL HCL 2.5 MG/ML IV SOLN
INTRAVENOUS | Status: AC
  Filled 2017-08-28: qty 2

## 2017-08-28 MED ORDER — LACTATED RINGERS IV SOLN
INTRAVENOUS | Status: DC
  Administered 2017-08-30: 21:00:00 via INTRAVENOUS

## 2017-08-28 MED ORDER — MIDAZOLAM HCL 2 MG/2ML IJ SOLN
INTRAMUSCULAR | Status: AC
  Filled 2017-08-28: qty 2

## 2017-08-28 MED ORDER — IOPAMIDOL (ISOVUE-300) INJECTION 61%
INTRAVENOUS | Status: DC | PRN
  Administered 2017-08-28: 75 mL via INTRA_ARTERIAL

## 2017-08-28 MED ORDER — DOPAMINE-DEXTROSE 3.2-5 MG/ML-% IV SOLN
3.0000 ug/kg/min | INTRAVENOUS | Status: DC
  Administered 2017-08-28: 3 ug/kg/min via INTRAVENOUS
  Filled 2017-08-28: qty 250

## 2017-08-28 MED ORDER — HEPARIN SODIUM (PORCINE) 1000 UNIT/ML IJ SOLN
INTRAMUSCULAR | Status: DC | PRN
  Administered 2017-08-28: 3000 [IU] via INTRAVENOUS
  Administered 2017-08-28: 4000 [IU] via INTRAVENOUS

## 2017-08-28 MED ORDER — SODIUM CHLORIDE 0.9 % IV SOLN
INTRAVENOUS | Status: DC
  Administered 2017-08-28: 09:00:00 via INTRAVENOUS

## 2017-08-28 MED ORDER — SODIUM CHLORIDE 0.9 % IV SOLN
INTRAVENOUS | Status: DC
  Administered 2017-08-28: 17:00:00 via INTRAVENOUS

## 2017-08-28 MED ORDER — BISACODYL 10 MG RE SUPP: 10.0000 mg | Freq: Every day | RECTAL | Status: DC

## 2017-08-28 MED ORDER — PAPAVERINE HCL 30 MG/ML IJ SOLN
INTRAMUSCULAR | Status: AC
  Administered 2017-08-28: 13:00:00
  Filled 2017-08-28: qty 2.5

## 2017-08-28 MED ORDER — VANCOMYCIN HCL 1000 MG IV SOLR
INTRAVENOUS | Status: DC | PRN
  Administered 2017-08-28: 1250 mg via INTRAVENOUS

## 2017-08-28 MED ORDER — ACETAMINOPHEN 160 MG/5ML PO SOLN: 1000.0000 mg | Freq: Four times a day (QID) | ORAL | Status: DC

## 2017-08-28 MED ORDER — FENTANYL CITRATE (PF) 250 MCG/5ML IJ SOLN
INTRAMUSCULAR | Status: AC
  Filled 2017-08-28: qty 5

## 2017-08-28 MED ORDER — SODIUM CHLORIDE 0.9 % IV SOLN
INTRAVENOUS | Status: DC
  Administered 2017-08-28: 8.3 [IU]/h via INTRAVENOUS
  Administered 2017-08-29: 9.9 [IU]/h via INTRAVENOUS
  Filled 2017-08-28 (×2): qty 1

## 2017-08-28 MED ORDER — ESMOLOL HCL 100 MG/10ML IV SOLN
INTRAVENOUS | Status: DC | PRN
  Administered 2017-08-28 (×3): 10 mg via INTRAVENOUS

## 2017-08-28 MED ORDER — MAGNESIUM SULFATE 50 % IJ SOLN
40.0000 meq | INTRAMUSCULAR | Status: DC
  Filled 2017-08-28: qty 10

## 2017-08-28 MED ORDER — TRANEXAMIC ACID (OHS) BOLUS VIA INFUSION
15.0000 mg/kg | INTRAVENOUS | Status: AC
  Administered 2017-08-28: 1170 mg via INTRAVENOUS
  Filled 2017-08-28: qty 1170

## 2017-08-28 MED ORDER — CALCIPOTRIENE 0.005 % EX CREA
1.0000 | TOPICAL_CREAM | Freq: Two times a day (BID) | CUTANEOUS | Status: DC
Start: 2017-08-29 — End: 2017-08-28

## 2017-08-28 MED ORDER — DEXMEDETOMIDINE HCL IN NACL 400 MCG/100ML IV SOLN
0.1000 ug/kg/h | INTRAVENOUS | Status: AC
  Administered 2017-08-28: .5 ug/kg/h via INTRAVENOUS
  Administered 2017-08-28: .3 ug/kg/h via INTRAVENOUS
  Filled 2017-08-28: qty 100

## 2017-08-28 MED ORDER — ONDANSETRON HCL 4 MG/2ML IJ SOLN
4.0000 mg | Freq: Four times a day (QID) | INTRAMUSCULAR | Status: DC | PRN
  Administered 2017-09-02: 4 mg via INTRAVENOUS
  Filled 2017-08-28: qty 2

## 2017-08-28 MED ORDER — ALBUMIN HUMAN 5 % IV SOLN
12.5000 g | Freq: Once | INTRAVENOUS | Status: AC
  Administered 2017-08-28: 12.5 g via INTRAVENOUS

## 2017-08-28 MED ORDER — VANCOMYCIN HCL IN DEXTROSE 1-5 GM/200ML-% IV SOLN
1000.0000 mg | Freq: Once | INTRAVENOUS | Status: AC
Start: 2017-08-28 — End: 2017-08-28
  Administered 2017-08-28: 1000 mg via INTRAVENOUS
  Filled 2017-08-28: qty 200

## 2017-08-28 MED ORDER — FENTANYL CITRATE (PF) 100 MCG/2ML IJ SOLN
INTRAMUSCULAR | Status: DC | PRN
  Administered 2017-08-28: 200 ug via INTRAVENOUS
  Administered 2017-08-28: 100 ug via INTRAVENOUS
  Administered 2017-08-28: 25 ug via INTRAVENOUS
  Administered 2017-08-28: 100 ug via INTRAVENOUS
  Administered 2017-08-28: 50 ug via INTRAVENOUS
  Administered 2017-08-28 (×2): 100 ug via INTRAVENOUS
  Administered 2017-08-28: 200 ug via INTRAVENOUS
  Administered 2017-08-28: 225 ug via INTRAVENOUS
  Administered 2017-08-28: 200 ug via INTRAVENOUS
  Administered 2017-08-28 (×2): 100 ug via INTRAVENOUS

## 2017-08-28 MED ORDER — POTASSIUM CHLORIDE 10 MEQ/50ML IV SOLN: 10.0000 meq | INTRAVENOUS | Status: AC

## 2017-08-28 MED ORDER — ORAL CARE MOUTH RINSE
15.0000 mL | Freq: Four times a day (QID) | OROMUCOSAL | Status: DC
  Administered 2017-08-29 – 2017-08-31 (×10): 15 mL via OROMUCOSAL

## 2017-08-28 MED ORDER — MIDAZOLAM HCL 5 MG/5ML IJ SOLN
INTRAMUSCULAR | Status: DC | PRN
  Administered 2017-08-28 (×6): 2 mg via INTRAVENOUS

## 2017-08-28 MED ORDER — VANCOMYCIN HCL 10 G IV SOLR
1250.0000 mg | INTRAVENOUS | Status: DC
  Filled 2017-08-28: qty 1250

## 2017-08-28 MED ORDER — ETOMIDATE 2 MG/ML IV SOLN
INTRAVENOUS | Status: DC | PRN
  Administered 2017-08-28: 18 mg via INTRAVENOUS

## 2017-08-28 MED ORDER — MAGNESIUM SULFATE 4 GM/100ML IV SOLN
4.0000 g | Freq: Once | INTRAVENOUS | Status: AC
Start: 2017-08-28 — End: 2017-08-28
  Administered 2017-08-28: 4 g via INTRAVENOUS
  Filled 2017-08-28: qty 100

## 2017-08-28 MED ORDER — ACETAMINOPHEN 160 MG/5ML PO SOLN: 650.0000 mg | Freq: Once | ORAL | Status: AC

## 2017-08-28 MED ORDER — TRANEXAMIC ACID (OHS) PUMP PRIME SOLUTION
2.0000 mg/kg | INTRAVENOUS | Status: DC
  Filled 2017-08-28: qty 1.56

## 2017-08-28 MED ORDER — THROMBIN 20000 UNITS EX SOLR
CUTANEOUS | Status: DC | PRN
  Administered 2017-08-28: 20000 [IU] via TOPICAL

## 2017-08-28 MED ORDER — DIPHENHYDRAMINE HCL 50 MG/ML IJ SOLN
50.0000 mg | Freq: Once | INTRAMUSCULAR | Status: AC
  Administered 2017-08-28: 50 mg via INTRAVENOUS

## 2017-08-28 MED ORDER — ALBUMIN HUMAN 5 % IV SOLN
INTRAVENOUS | Status: AC
  Administered 2017-08-28: 12.5 g via INTRAVENOUS
  Filled 2017-08-28: qty 250

## 2017-08-28 MED ORDER — HEPARIN SODIUM (PORCINE) 1000 UNIT/ML IJ SOLN
INTRAMUSCULAR | Status: AC
  Filled 2017-08-28: qty 1

## 2017-08-28 MED ORDER — SODIUM CHLORIDE 0.9 % IV SOLN
0.0000 ug/kg/h | INTRAVENOUS | Status: DC
  Administered 2017-08-28: 0.5 ug/kg/h via INTRAVENOUS
  Filled 2017-08-28: qty 2

## 2017-08-28 MED ORDER — DEXTROSE 5 % IV SOLN
750.0000 mg | INTRAVENOUS | Status: DC
  Filled 2017-08-28: qty 750

## 2017-08-28 MED ORDER — SODIUM CHLORIDE 0.9 % IV SOLN: 250.0000 mL | INTRAVENOUS | Status: DC

## 2017-08-28 MED ORDER — ASPIRIN EC 325 MG PO TBEC
325.0000 mg | DELAYED_RELEASE_TABLET | Freq: Every day | ORAL | Status: DC
  Administered 2017-08-29 – 2017-09-01 (×4): 325 mg via ORAL
  Filled 2017-08-28 (×4): qty 1

## 2017-08-28 MED ORDER — GLYCOPYRROLATE 0.2 MG/ML IV SOSY
PREFILLED_SYRINGE | INTRAVENOUS | Status: DC | PRN
  Administered 2017-08-28 (×3): .2 mg via INTRAVENOUS

## 2017-08-28 MED ORDER — FAMOTIDINE IN NACL 20-0.9 MG/50ML-% IV SOLN
20.0000 mg | Freq: Two times a day (BID) | INTRAVENOUS | Status: AC
  Administered 2017-08-28: 20 mg via INTRAVENOUS
  Filled 2017-08-28: qty 50

## 2017-08-28 MED ORDER — EPHEDRINE SULFATE 50 MG/ML IJ SOLN
INTRAMUSCULAR | Status: DC | PRN
  Administered 2017-08-28: 5 mg via INTRAVENOUS
  Administered 2017-08-28 (×3): 10 mg via INTRAVENOUS

## 2017-08-28 MED ORDER — SIMVASTATIN 40 MG PO TABS
40.0000 mg | ORAL_TABLET | Freq: Every day | ORAL | Status: DC
  Administered 2017-08-29 – 2017-09-01 (×4): 40 mg via ORAL
  Filled 2017-08-28 (×4): qty 1

## 2017-08-28 MED ORDER — SODIUM CHLORIDE 0.9 % IV SOLN
INTRAVENOUS | Status: DC | PRN
  Administered 2017-08-28: 17:00:00 via INTRAVENOUS

## 2017-08-28 MED ORDER — HEMOSTATIC AGENTS (NO CHARGE) OPTIME
TOPICAL | Status: DC | PRN
  Administered 2017-08-28: 1 via TOPICAL

## 2017-08-28 MED ORDER — INSULIN REGULAR HUMAN 100 UNIT/ML IJ SOLN
INTRAMUSCULAR | Status: DC
  Filled 2017-08-28: qty 1

## 2017-08-28 MED ORDER — LACTATED RINGERS IV SOLN: 500.0000 mL | Freq: Once | INTRAVENOUS | Status: DC | PRN

## 2017-08-28 MED ORDER — 0.9 % SODIUM CHLORIDE (POUR BTL) OPTIME
TOPICAL | Status: DC | PRN
  Administered 2017-08-28: 6000 mL

## 2017-08-28 MED ORDER — SODIUM CHLORIDE 0.9 % IV SOLN
30.0000 ug/min | INTRAVENOUS | Status: DC
  Filled 2017-08-28: qty 2

## 2017-08-28 MED ORDER — ATROPINE SULFATE 1 MG/10ML IJ SOSY
PREFILLED_SYRINGE | INTRAMUSCULAR | Status: AC
  Filled 2017-08-28: qty 10

## 2017-08-28 MED ORDER — HEPARIN (PORCINE) IN NACL 2-0.9 UNIT/ML-% IJ SOLN
INTRAMUSCULAR | Status: AC
  Filled 2017-08-28: qty 500

## 2017-08-28 MED ORDER — TRAMADOL HCL 50 MG PO TABS
50.0000 mg | ORAL_TABLET | ORAL | Status: DC | PRN
  Administered 2017-08-29 – 2017-08-31 (×4): 50 mg via ORAL
  Filled 2017-08-28 (×4): qty 1

## 2017-08-28 MED ORDER — FENTANYL CITRATE (PF) 100 MCG/2ML IJ SOLN
INTRAMUSCULAR | Status: AC
  Filled 2017-08-28: qty 2

## 2017-08-28 MED ORDER — MORPHINE SULFATE (PF) 4 MG/ML IV SOLN: 1.0000 mg | INTRAVENOUS | Status: AC | PRN

## 2017-08-28 MED ORDER — DOPAMINE-DEXTROSE 3.2-5 MG/ML-% IV SOLN
0.0000 ug/kg/min | INTRAVENOUS | Status: AC
  Administered 2017-08-28: 3 ug/kg/min via INTRAVENOUS

## 2017-08-28 MED ORDER — MILRINONE LACTATE IN DEXTROSE 20-5 MG/100ML-% IV SOLN
0.1250 ug/kg/min | INTRAVENOUS | Status: DC
Start: 2017-08-28 — End: 2017-08-30
  Administered 2017-08-28: 0.3 ug/kg/min via INTRAVENOUS
  Administered 2017-08-29: 0.2 ug/kg/min via INTRAVENOUS
  Filled 2017-08-28 (×2): qty 100

## 2017-08-28 MED ORDER — SODIUM BICARBONATE 8.4 % IV SOLN: 50.0000 meq | Freq: Once | INTRAVENOUS | Status: AC

## 2017-08-28 MED ORDER — ROCURONIUM BROMIDE 100 MG/10ML IV SOLN
INTRAVENOUS | Status: DC | PRN
  Administered 2017-08-28: 50 mg via INTRAVENOUS
  Administered 2017-08-28: 100 mg via INTRAVENOUS
  Administered 2017-08-28: 50 mg via INTRAVENOUS

## 2017-08-28 MED ORDER — MIDAZOLAM HCL 2 MG/2ML IJ SOLN
INTRAMUSCULAR | Status: DC | PRN
  Administered 2017-08-28: 1 mg via INTRAVENOUS

## 2017-08-28 MED ORDER — DEXTROSE 5 % IV SOLN
0.0000 ug/min | INTRAVENOUS | Status: DC
  Filled 2017-08-28: qty 4

## 2017-08-28 MED ORDER — CEFUROXIME SODIUM 1.5 G IV SOLR
1.5000 g | INTRAVENOUS | Status: AC
  Administered 2017-08-28: 1.5 g via INTRAVENOUS
  Administered 2017-08-28: .75 g via INTRAVENOUS
  Filled 2017-08-28: qty 1.5

## 2017-08-28 MED ORDER — FENTANYL CITRATE (PF) 100 MCG/2ML IJ SOLN
INTRAMUSCULAR | Status: DC | PRN
  Administered 2017-08-28: 25 ug via INTRAVENOUS

## 2017-08-28 MED ORDER — ARTIFICIAL TEARS OPHTHALMIC OINT
TOPICAL_OINTMENT | OPHTHALMIC | Status: DC | PRN
  Administered 2017-08-28: 1 via OPHTHALMIC

## 2017-08-28 MED ORDER — DOPAMINE-DEXTROSE 3.2-5 MG/ML-% IV SOLN: 0.0000 ug/kg/min | INTRAVENOUS | Status: DC

## 2017-08-28 MED ORDER — LACTATED RINGERS IV SOLN: INTRAVENOUS | Status: DC

## 2017-08-28 MED ORDER — VERAPAMIL HCL 2.5 MG/ML IV SOLN
INTRAVENOUS | Status: DC | PRN
  Administered 2017-08-28: 2.5 mg via INTRA_ARTERIAL

## 2017-08-28 MED ORDER — POTASSIUM CHLORIDE 2 MEQ/ML IV SOLN
80.0000 meq | INTRAVENOUS | Status: DC
  Filled 2017-08-28: qty 40

## 2017-08-28 MED ORDER — CHLORHEXIDINE GLUCONATE 0.12 % MT SOLN: 15.0000 mL | OROMUCOSAL | Status: AC

## 2017-08-28 SURGICAL SUPPLY — 135 items
ADAPTER CARDIO PERF ANTE/RETRO (ADAPTER) ×3 IMPLANT
APPLICATOR TIP COSEAL (VASCULAR PRODUCTS) IMPLANT
ATTRACTOMAT 16X20 MAGNETIC DRP (DRAPES) ×3 IMPLANT
BAG DECANTER FOR FLEXI CONT (MISCELLANEOUS) ×3 IMPLANT
BANDAGE ACE 4X5 VEL STRL LF (GAUZE/BANDAGES/DRESSINGS) ×6 IMPLANT
BANDAGE ACE 6X5 VEL STRL LF (GAUZE/BANDAGES/DRESSINGS) ×6 IMPLANT
BASKET HEART  (ORDER IN 25'S) (MISCELLANEOUS) ×1
BASKET HEART (ORDER IN 25'S) (MISCELLANEOUS) ×1
BASKET HEART (ORDER IN 25S) (MISCELLANEOUS) ×1 IMPLANT
BLADE STERNUM SYSTEM 6 (BLADE) ×3 IMPLANT
BLADE SURG 15 STRL LF DISP TIS (BLADE) ×1 IMPLANT
BLADE SURG 15 STRL SS (BLADE) ×2
BNDG GAUZE ELAST 4 BULKY (GAUZE/BANDAGES/DRESSINGS) ×6 IMPLANT
CANISTER SUCT 3000ML PPV (MISCELLANEOUS) ×3 IMPLANT
CANNULA GUNDRY RCSP 15FR (MISCELLANEOUS) ×3 IMPLANT
CANNULA SUMP PERICARDIAL (CANNULA) ×3 IMPLANT
CATH HEART VENT LEFT (CATHETERS) ×1 IMPLANT
CATH ROBINSON RED A/P 18FR (CATHETERS) ×9 IMPLANT
CATH THORACIC 28FR (CATHETERS) ×3 IMPLANT
CATH THORACIC 36FR (CATHETERS) ×3 IMPLANT
CATH THORACIC 36FR RT ANG (CATHETERS) ×3 IMPLANT
CAUTERY HIGH TEMP VAS (MISCELLANEOUS) ×3 IMPLANT
CAUTERY SURG HI TEMP FINE TIP (MISCELLANEOUS) ×3 IMPLANT
CLIP VESOCCLUDE MED 24/CT (CLIP) ×3 IMPLANT
CLIP VESOCCLUDE SM WIDE 24/CT (CLIP) IMPLANT
CRADLE DONUT ADULT HEAD (MISCELLANEOUS) ×3 IMPLANT
DRAPE CARDIOVASCULAR INCISE (DRAPES) ×2
DRAPE SLUSH/WARMER DISC (DRAPES) ×3 IMPLANT
DRAPE SRG 135X102X78XABS (DRAPES) ×1 IMPLANT
DRSG COVADERM 4X14 (GAUZE/BANDAGES/DRESSINGS) ×3 IMPLANT
ELECT CAUTERY BLADE 6.4 (BLADE) ×3 IMPLANT
ELECT REM PT RETURN 9FT ADLT (ELECTROSURGICAL) ×6
ELECTRODE REM PT RTRN 9FT ADLT (ELECTROSURGICAL) ×2 IMPLANT
FELT TEFLON 1X6 (MISCELLANEOUS) ×3 IMPLANT
GAUZE SPONGE 4X4 12PLY STRL LF (GAUZE/BANDAGES/DRESSINGS) ×9 IMPLANT
GLOVE BIO SURGEON STRL SZ 6 (GLOVE) IMPLANT
GLOVE BIO SURGEON STRL SZ 6.5 (GLOVE) ×4 IMPLANT
GLOVE BIO SURGEON STRL SZ7 (GLOVE) ×18 IMPLANT
GLOVE BIO SURGEON STRL SZ7.5 (GLOVE) IMPLANT
GLOVE BIO SURGEONS STRL SZ 6.5 (GLOVE) ×2
GLOVE BIOGEL PI IND STRL 6 (GLOVE) ×3 IMPLANT
GLOVE BIOGEL PI IND STRL 6.5 (GLOVE) IMPLANT
GLOVE BIOGEL PI IND STRL 7.0 (GLOVE) ×4 IMPLANT
GLOVE BIOGEL PI INDICATOR 6 (GLOVE) ×6
GLOVE BIOGEL PI INDICATOR 6.5 (GLOVE)
GLOVE BIOGEL PI INDICATOR 7.0 (GLOVE) ×8
GLOVE EUDERMIC 7 POWDERFREE (GLOVE) ×6 IMPLANT
GLOVE ORTHO TXT STRL SZ7.5 (GLOVE) IMPLANT
GLOVE SKINSENSE NS SZ7.5 (GLOVE) ×2
GLOVE SKINSENSE STRL SZ7.5 (GLOVE) ×1 IMPLANT
GOWN STRL REUS W/ TWL LRG LVL3 (GOWN DISPOSABLE) ×8 IMPLANT
GOWN STRL REUS W/ TWL XL LVL3 (GOWN DISPOSABLE) ×2 IMPLANT
GOWN STRL REUS W/TWL LRG LVL3 (GOWN DISPOSABLE) ×16
GOWN STRL REUS W/TWL XL LVL3 (GOWN DISPOSABLE) ×4
GRAFT VASC 8MMX60CM STRAIGHT (Prosthesis & Implant Heart) ×3 IMPLANT
HEMOSTAT POWDER SURGIFOAM 1G (HEMOSTASIS) ×12 IMPLANT
HEMOSTAT SURGICEL 2X14 (HEMOSTASIS) ×3 IMPLANT
INSERT FOGARTY 61MM (MISCELLANEOUS) IMPLANT
INSERT FOGARTY XLG (MISCELLANEOUS) ×3 IMPLANT
KIT BASIN OR (CUSTOM PROCEDURE TRAY) ×3 IMPLANT
KIT CATH CPB BARTLE (MISCELLANEOUS) ×3 IMPLANT
KIT ROOM TURNOVER OR (KITS) ×3 IMPLANT
KIT SUCTION CATH 14FR (SUCTIONS) ×3 IMPLANT
KIT VASOVIEW HEMOPRO VH 3000 (KITS) ×3 IMPLANT
LINE VENT (MISCELLANEOUS) ×3 IMPLANT
LOOP VESSEL MAXI BLUE (MISCELLANEOUS) ×3 IMPLANT
LOOP VESSEL SUPERMAXI WHITE (MISCELLANEOUS) ×3 IMPLANT
NS IRRIG 1000ML POUR BTL (IV SOLUTION) ×18 IMPLANT
PACK OPEN HEART (CUSTOM PROCEDURE TRAY) ×3 IMPLANT
PAD ARMBOARD 7.5X6 YLW CONV (MISCELLANEOUS) ×6 IMPLANT
PAD ELECT DEFIB RADIOL ZOLL (MISCELLANEOUS) ×3 IMPLANT
PENCIL BUTTON HOLSTER BLD 10FT (ELECTRODE) ×3 IMPLANT
PUNCH AORTIC ROTATE  4.5MM 8IN (MISCELLANEOUS) ×3 IMPLANT
PUNCH AORTIC ROTATE 4.0MM (MISCELLANEOUS) IMPLANT
PUNCH AORTIC ROTATE 5MM 8IN (MISCELLANEOUS) IMPLANT
SEALANT SURG COSEAL 8ML (VASCULAR PRODUCTS) IMPLANT
SET CARDIOPLEGIA MPS 5001102 (MISCELLANEOUS) IMPLANT
SET VEIN GRAFT PERF (SET/KITS/TRAYS/PACK) ×3 IMPLANT
SPONGE INTESTINAL PEANUT (DISPOSABLE) IMPLANT
SPONGE LAP 18X18 X RAY DECT (DISPOSABLE) IMPLANT
SPONGE LAP 4X18 X RAY DECT (DISPOSABLE) ×3 IMPLANT
SUT BONE WAX W31G (SUTURE) ×3 IMPLANT
SUT ETHIBON 2 0 V 52N 30 (SUTURE) ×6 IMPLANT
SUT ETHIBON EXCEL 2-0 V-5 (SUTURE) IMPLANT
SUT ETHIBOND 2 0 SH (SUTURE) ×8
SUT ETHIBOND 2 0 SH 36X2 (SUTURE) ×4 IMPLANT
SUT ETHIBOND V-5 VALVE (SUTURE) IMPLANT
SUT ETHILON 3 0 FSL (SUTURE) ×6 IMPLANT
SUT MNCRL AB 4-0 PS2 18 (SUTURE) IMPLANT
SUT PROLENE 3 0 SH DA (SUTURE) ×3 IMPLANT
SUT PROLENE 3 0 SH1 36 (SUTURE) ×3 IMPLANT
SUT PROLENE 4 0 RB 1 (SUTURE) ×16
SUT PROLENE 4 0 SH DA (SUTURE) ×3 IMPLANT
SUT PROLENE 4-0 RB1 .5 CRCL 36 (SUTURE) ×8 IMPLANT
SUT PROLENE 5 0 C 1 36 (SUTURE) ×12 IMPLANT
SUT PROLENE 5 0 RB 2 (SUTURE) ×6 IMPLANT
SUT PROLENE 6 0 C 1 30 (SUTURE) ×15 IMPLANT
SUT PROLENE 7 0 BV 1 (SUTURE) IMPLANT
SUT PROLENE 7 0 BV1 MDA (SUTURE) ×3 IMPLANT
SUT PROLENE 8 0 BV175 6 (SUTURE) ×6 IMPLANT
SUT SILK  1 MH (SUTURE) ×6
SUT SILK 1 MH (SUTURE) ×3 IMPLANT
SUT SILK 1 TIES 10X30 (SUTURE) ×3 IMPLANT
SUT SILK 2 0 SH CR/8 (SUTURE) ×6 IMPLANT
SUT SILK 2 0 TIES 10X30 (SUTURE) ×3 IMPLANT
SUT SILK 2 0 TIES 17X18 (SUTURE) ×2
SUT SILK 2-0 18XBRD TIE BLK (SUTURE) ×1 IMPLANT
SUT SILK 3 0 (SUTURE) ×2
SUT SILK 3 0 SH CR/8 (SUTURE) ×3 IMPLANT
SUT SILK 3-0 18XBRD TIE 12 (SUTURE) ×1 IMPLANT
SUT SILK 4 0 TIE 10X30 (SUTURE) ×6 IMPLANT
SUT STEEL 6MS V (SUTURE) IMPLANT
SUT STEEL STERNAL CCS#1 18IN (SUTURE) IMPLANT
SUT STEEL SZ 6 DBL 3X14 BALL (SUTURE) IMPLANT
SUT TEM PAC WIRE 2 0 SH (SUTURE) ×12 IMPLANT
SUT VIC AB 1 CTX 36 (SUTURE) ×4
SUT VIC AB 1 CTX36XBRD ANBCTR (SUTURE) ×2 IMPLANT
SUT VIC AB 2-0 CT1 (SUTURE) ×3 IMPLANT
SUT VIC AB 2-0 CT1 27 (SUTURE) ×2
SUT VIC AB 2-0 CT1 TAPERPNT 27 (SUTURE) ×1 IMPLANT
SUT VIC AB 2-0 CTX 27 (SUTURE) ×6 IMPLANT
SUT VIC AB 3-0 SH 27 (SUTURE)
SUT VIC AB 3-0 SH 27X BRD (SUTURE) IMPLANT
SUT VIC AB 3-0 X1 27 (SUTURE) ×6 IMPLANT
SUT VICRYL 4-0 PS2 18IN ABS (SUTURE) IMPLANT
SYSTEM SAHARA CHEST DRAIN ATS (WOUND CARE) ×3 IMPLANT
TAPE CLOTH SURG 4X10 WHT LF (GAUZE/BANDAGES/DRESSINGS) ×6 IMPLANT
TAPE PAPER 2X10 WHT MICROPORE (GAUZE/BANDAGES/DRESSINGS) ×3 IMPLANT
TOWEL OR 17X24 6PK STRL BLUE (TOWEL DISPOSABLE) ×3 IMPLANT
TOWEL OR 17X26 10 PK STRL BLUE (TOWEL DISPOSABLE) ×3 IMPLANT
TRAY FOLEY SILVER 16FR TEMP (SET/KITS/TRAYS/PACK) ×3 IMPLANT
TUBING INSUFFLATION (TUBING) ×3 IMPLANT
UNDERPAD 30X30 (UNDERPADS AND DIAPERS) ×3 IMPLANT
VENT LEFT HEART 12002 (CATHETERS) ×3
WATER STERILE IRR 1000ML POUR (IV SOLUTION) ×6 IMPLANT

## 2017-08-28 SURGICAL SUPPLY — 14 items
CABLE ADAPT CONN TEMP 6FT (ADAPTER) ×4 IMPLANT
CATH LAUNCHER 6FR JR4 (CATHETERS) ×4 IMPLANT
CATH OPTITORQUE JACKY 4.0 5F (CATHETERS) ×4 IMPLANT
DEVICE INFLAT 30 PLUS (MISCELLANEOUS) ×4 IMPLANT
DEVICE RAD TR BAND REGULAR (VASCULAR PRODUCTS) ×4 IMPLANT
GLIDESHEATH SLEND SS 6F .021 (SHEATH) ×4 IMPLANT
KIT MANI 3VAL PERCEP (MISCELLANEOUS) ×4 IMPLANT
NEEDLE PERC 18GX7CM (NEEDLE) ×4 IMPLANT
PACK CARDIAC CATH (CUSTOM PROCEDURE TRAY) ×4 IMPLANT
SHEATH PINNACLE 7F 10CM (SHEATH) ×4 IMPLANT
SLEEVE REPOSITIONING LENGTH 30 (MISCELLANEOUS) ×4 IMPLANT
WIRE PACING TEMP ST TIP 5 (CATHETERS) ×4 IMPLANT
WIRE PRESSURE VERRATA (WIRE) ×4 IMPLANT
WIRE ROSEN-J .035X260CM (WIRE) ×4 IMPLANT

## 2017-08-28 NOTE — Progress Notes (Signed)
[facility] BRIEF SICU PROGRESS NOTE  Day of Surgery  S/P Procedure(s) (LRB): REPAIR OF RIGHT CORONARY ARTERY DISSECTION AND REPAIR OF AORTIC ROOT DISSECTION (N/A) CORONARY ARTERY BYPASS GRAFTING (CABG) x 1, using left greater saphenous vein harvested endoscopically (N/A) INTRAOPERATIVE TRANSESOPHAGEAL ECHOCARDIOGRAM (N/A)   Stable initial postop NSR w/ stable hemodynamics on low dose Neo drip O2 sats 100% Chest tube output low UOP adequate Labs okay  Plan: Continue routine early postop  Rexene Alberts, MD 09/14/2017 6:07 PM

## 2017-08-28 NOTE — H&P (View-Only) (Signed)
Cardiology Office Note   Date:  08/22/2017   ID:  Caroline Gray, DOB [birth year], MRN [id number]  PCP:  Leone Haven, MD  Cardiologist:   Kathlyn Sacramento, MD   Chief Complaint  Patient presents with  . other    12 month follow up. Meds reviewed by the pt. verbally. Pt. c/o shortness of breath with exertion.       History of Present Illness: Caroline Gray is a 71 y.o. female who presents for a follow-up visit regarding coronary atherosclerosis and recent chest pain. She is known to have coronary atherosclerosis noted on CT scan.  She had a normal nuclear stress test in 2015. She has chronic medical conditions that include type 2 diabetes which was diagnosed in the early 90s, hypertension, hyperlipidemia and severe psoriasis. She also has family history of coronary artery disease.  Over the last few months, she has experienced consistent symptoms of substernal chest pain and tightness which only happens with physical activities. Initially it started happening with going upstairs but then it progressed to discomfort even with walking on flat level. The discomfort does not resolve until she stops her activities. No chest pain at rest. She has associated exertional dyspnea but no orthopnea, PND or leg edema.  Past Medical History:  Diagnosis Date  . Adenomatous colon polyp   . Anxiety   . Arthritis   . Cataract   . Diabetes mellitus   . Diverticulosis   . 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 History of kidney stones   . Hx: UTI (urinary tract infection)   . Hyperlipidemia   . Hypertension   . IBS (irritable bowel syndrome)   . Positive PPD   . Psoriasis     Past Surgical History:  Procedure Laterality Date  . ABDOMINAL HYSTERECTOMY    . ABDOMINAL HYSTERECTOMY    . APPENDECTOMY    . BREAST BIOPSY Right 1990   neg  . CATARACT EXTRACTION    . CHOLECYSTECTOMY    . CYSTOSCOPY WITH STENT PLACEMENT Bilateral 01/22/2017   Procedure: CYSTOSCOPY WITH STENT PLACEMENT;  Surgeon: Nickie Retort, MD;  Location: [facility] ORS;  Service: Urology;  Laterality: Bilateral;  . EYE SURGERY Bilateral    Cataract Extraction with IOL   . LITHOTRIPSY  3.7.13   Dr Jacqlyn Larsen  . ROTATOR CUFF REPAIR Right 08/2005  . SPINE SURGERY    . URETEROSCOPY WITH HOLMIUM LASER LITHOTRIPSY Bilateral 01/22/2017   Procedure: URETEROSCOPY WITH HOLMIUM LASER LITHOTRIPSY;  Surgeon: Nickie Retort, MD;  Location: [facility] ORS;  Service: Urology;  Laterality: Bilateral;     Current Outpatient Prescriptions  Medication Sig Dispense Refill  . aspirin 81 MG tablet Take 1 tablet (81 mg total) by mouth daily. 30 tablet   . calcipotriene (DOVONOX) 0.005 % cream Apply 1 application topically 2 (two) times daily.     . Calcium Citrate-Vitamin D (CALCIUM CITRATE + D PO) Take 1 capsule by mouth 2 (two) times daily.      . clobetasol ointment (TEMOVATE) 2.37 % Apply 1 application topically 2 (two) times daily.    Stasia Cavalier (EUCRISA) 2 % OINT Apply 1 application topically 2 (two) times daily.    . DULoxetine (CYMBALTA) 30 MG capsule Take 1 capsule (30 mg total) by mouth daily. 90 capsule 1  . gabapentin (NEURONTIN) 300 MG capsule Take 1 capsule (300 mg total) by mouth 3 (three) times daily. 270 capsule 1  . glucose blood test strip Test 2 times daily with  Accu-Chek monitor 100 each 3  . Guselkumab 100 MG/ML SOSY 100 mg. Once every 8 weeks    . ibuprofen (ADVIL,MOTRIN) 200 MG tablet Take 200 mg by mouth every 6 (six) hours as needed for mild pain.    . Insulin Glargine (LANTUS SOLOSTAR) 100 UNIT/ML Solostar Pen Inject 38 Units into the skin daily at 10 pm. 15 mL 4  . Insulin Pen Needle 33G X 8 MM MISC 1 application by Does not apply route daily. 100 each 11  . losartan (COZAAR) 50 MG tablet Take 1 tablet (50 mg total) by mouth daily. 90 tablet 3  . metFORMIN (GLUCOPHAGE) 1000 MG tablet Take 1 tablet (1,000 mg total) by mouth 2 (two) times daily with a meal. 180 tablet 3  . simvastatin (ZOCOR) 40 MG tablet TAKE 1 TABLET BY  MOUTH AT BEDTIME 90 tablet 1   No current facility-administered medications for this visit.     Allergies:   Codeine; Influenza vaccines; Ioxaglate; Ivp dye [iodinated diagnostic agents]; Macrodantin; and Nitrofurantoin    Social History:  The patient  reports that she quit smoking about 17 years ago. Her smoking use included Cigarettes. She quit after 15.00 years of use. She has never used smokeless tobacco. She reports that she drinks alcohol. She reports that she does not use drugs.   Family History:  The patient's family history includes Breast cancer (age of onset: 72) in her mother; Diabetes in her mother; Heart attack in her paternal aunt, paternal aunt, paternal aunt, paternal aunt, paternal uncle, paternal uncle, paternal uncle, and paternal uncle; Other in her son.    ROS:  Please see the history of present illness.   Otherwise, review of systems are positive for none.   All other systems are reviewed and negative.    PHYSICAL EXAM: VS:  BP 100/60 (BP Location: Left Arm, Patient Position: Sitting, Cuff Size: Normal)   Pulse (!) 103   Ht 5\' 3"  (1.6 m)   Wt 172 lb (78 kg)   BMI 30.47 kg/m  , BMI Body mass index is 30.47 kg/m. GEN: Well nourished, well developed, in no acute distress  HEENT: normal  Neck: no JVD, carotid bruits, or masses Cardiac: RRR; no murmurs, rubs, or gallops,no edema  Respiratory:  clear to auscultation bilaterally, normal work of breathing GI: soft, nontender, nondistended, + BS MS: no deformity or atrophy  Skin: warm and dry, no rash Neuro:  Strength and sensation are intact Psych: euthymic mood, full affect Right radial pulse is normal.  EKG:  EKG is ordered today. The ekg ordered today demonstrates sinus tachycardia with possible left atrial enlargement. No significant ST or T wave changes.   Recent Labs: 01/17/2017: Hemoglobin 13.5; Platelets 146 07/15/2017: ALT 26 07/25/2017: BUN 13; Creatinine, Ser 0.81; Potassium 3.8; Sodium 141    Lipid  Panel    Component Value Date/Time   CHOL 137 07/06/2015 0957   TRIG 142.0 07/06/2015 0957   HDL 33.30 (L) 07/06/2015 0957   CHOLHDL 4 07/06/2015 0957   VLDL 28.4 07/06/2015 0957   LDLCALC 75 07/06/2015 0957   LDLDIRECT 74.7 11/05/2013 1034      Wt Readings from Last 3 Encounters:  08/22/17 172 lb (78 kg)  07/17/17 170 lb (77.1 kg)  07/15/17 171 lb (77.6 kg)       ASSESSMENT AND PLAN:  1.   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 She  has been overall asymptomatic up until the last few months when she developed progressive exertional chest pain. Her current symptoms are suggestive of class III angina. Continue aspirin and simvastatin. I elected to switch losartan to metoprolol for its antianginal effect. Given her new symptoms and multiple risk factors, I have recommended proceeding with cardiac catheterization and possible coronary intervention. I discussed risks and benefits. She is allergic to contrast and will premedicate with prednisone.  2.  Essential hypertension: Blood pressure is controlled on losartan but I elected to switch to metoprolol given her cardiac symptoms.  3. Hyperlipidemia: Continue treatment with simvastatin. If cardiac cath shows obstructive coronary artery disease, I recommend switching to a more potent statin.   Disposition:   FU with me in 1 month.   Signed,  Kathlyn Sacramento, MD  08/22/2017 10:06 AM    [place]

## 2017-08-28 NOTE — Progress Notes (Signed)
D/t ABG results, vent rate increased to 20 per Dr Roxy Manns.

## 2017-08-28 NOTE — Progress Notes (Signed)
  Echocardiogram Echocardiogram Transesophageal has been performed.  Caroline Gray 08/23/2017, 1:55 PM

## 2017-08-28 NOTE — Discharge Instructions (Signed)

## 2017-08-28 NOTE — Anesthesia Procedure Notes (Signed)
Procedure Name: Intubation Date/Time: 09/15/2017 11:34 AM Performed by: Julieta Bellini Pre-anesthesia Checklist: Patient identified, Emergency Drugs available, Suction available and Patient being monitored Patient Re-evaluated:Patient Re-evaluated prior to induction Oxygen Delivery Method: Circle system utilized Preoxygenation: Pre-oxygenation with 100% oxygen Induction Type: IV induction Ventilation: Mask ventilation without difficulty Laryngoscope Size: Mac and 4 Grade View: Grade II Tube type: Subglottic suction tube Tube size: 7.5 mm Number of attempts: 1 Airway Equipment and Method: Stylet Placement Confirmation: ETT inserted through vocal cords under direct vision,  positive ETCO2 and breath sounds checked- equal and bilateral Secured at: 22 cm Tube secured with: Tape Dental Injury: Teeth and Oropharynx as per pre-operative assessment

## 2017-08-28 NOTE — Interval H&P Note (Signed)
Cath Lab Visit (complete for each Cath Lab visit)  Clinical Evaluation Leading to the Procedure:   ACS: No.  Non-ACS:    Anginal Classification: CCS III  Anti-ischemic medical therapy: Minimal Therapy (1 class of medications)  Non-Invasive Test Results: No non-invasive testing performed  Prior CABG: No previous CABG      History and Physical Interval Note:  08/24/2017 8:58 AM  Caroline Gray  has presented today for surgery, with the diagnosis of LT Cath  Angina  The various methods of treatment have been discussed with the patient and family. After consideration of risks, benefits and other options for treatment, the patient has consented to  Procedure(s): LEFT HEART CATH AND CORONARY ANGIOGRAPHY (Left) as a surgical intervention .  The patient's history has been reviewed, patient examined, no change in status, stable for surgery.  I have reviewed the patient's chart and labs.  Questions were answered to the patient's satisfaction.     Kathlyn Sacramento

## 2017-08-28 NOTE — Consult Note (Signed)
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 Karlene Lineman Caroline Gray is an 71 y.o. female.  HPI:   The patient is a 71 year old woman with DM, HL, HTN, with a history of vague chest pain and shortness of breath with exertion who underwent elective cath by Dr. Fletcher Anon at [facility] this am. This showed some mild proximal RCA stenosis that looked less than 50% to me. She had a 52F guide catheter inserted and a flow wire and subsequently developed RCA dissection with aortic root dissection. She started having CP and bradycardia and had pacer inserted. Dr. Fletcher Anon called me in the office and patient transferred emergently to [facility] for surgery. She is still having some chest pain and ST elevation o monitor. She is in sinus 80's.  Past Medical History:  Diagnosis Date  . Adenomatous colon polyp   . Anginal pain ([place])   . Anxiety   . Arthritis   . Cataract   . Diabetes mellitus   . Diverticulosis   . 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 History of kidney stones   . Hx: UTI (urinary tract infection)   . Hyperlipidemia   . Hypertension   . IBS (irritable bowel syndrome)   . Positive PPD   . Psoriasis     Past Surgical History:  Procedure Laterality Date  . ABDOMINAL HYSTERECTOMY    . ABDOMINAL HYSTERECTOMY    . APPENDECTOMY    . BREAST BIOPSY Right 1990   neg  . CATARACT EXTRACTION    . CHOLECYSTECTOMY    . CYSTOSCOPY WITH STENT PLACEMENT Bilateral 01/22/2017   Procedure: CYSTOSCOPY WITH STENT PLACEMENT;  Surgeon: Nickie Retort, MD;  Location: [facility] ORS;  Service: Urology;  Laterality: Bilateral;  . EYE SURGERY Bilateral    Cataract Extraction with IOL   . INTRAVASCULAR PRESSURE WIRE/FFR STUDY N/A 08/31/2017   Procedure: INTRAVASCULAR PRESSURE WIRE/FFR STUDY;  Surgeon: Wellington Hampshire, MD;   Location: [facility];  Service: Cardiovascular;  Laterality: N/A;  . LEFT HEART CATH AND CORONARY ANGIOGRAPHY Left 08/23/2017   Procedure: LEFT HEART CATH AND CORONARY ANGIOGRAPHY;  Surgeon: Wellington Hampshire, MD;  Location: [facility];  Service: Cardiovascular;  Laterality: Left;  . LITHOTRIPSY  3.7.13   Dr Jacqlyn Larsen  . ROTATOR CUFF REPAIR Right 08/2005  . SPINE SURGERY    . URETEROSCOPY WITH HOLMIUM LASER LITHOTRIPSY Bilateral 01/22/2017   Procedure: URETEROSCOPY WITH HOLMIUM LASER LITHOTRIPSY;  Surgeon: Nickie Retort, MD;  Location: [facility] ORS;  Service: Urology;  Laterality: Bilateral;    Family History  Problem Relation Age of Onset  . Diabetes Mother   . Breast cancer Mother 50  . Heart attack Paternal Aunt   . Heart attack Paternal Uncle   . Heart attack Paternal Uncle   . Heart attack Paternal Uncle   . Heart attack Paternal Uncle   . Heart attack Paternal Aunt   . Heart attack Paternal Aunt   . Heart attack Paternal Aunt   . Other Son        Ulcerative Proctitis   . Colon cancer Neg Hx   . Colon polyps Neg Hx   . Rectal cancer Neg Hx   . Stomach  cancer Neg Hx     Social History:  reports that she quit smoking about 17 years ago. Her smoking use included Cigarettes. She quit after 15.00 years of use. She has never used smokeless tobacco. She reports that she drinks alcohol. She reports that she does not use drugs.  Allergies:  Allergies  Allergen Reactions  . Influenza Vaccines Other (See Comments)    Kandis Mannan due to condition unable to have ANY VACCINATIONS  . Codeine Nausea And Vomiting    Nausea and vomiting  . Macrodantin Nausea And Vomiting  . Ioxaglate Palpitations and Other (See Comments)    Shaking and rapid heart beat  . Ivp Dye [Iodinated Diagnostic Agents] Palpitations and Other (See Comments)    Shaking and rapid heart beat Has had since this reaction and been okay     Medications:  I have reviewed the patient's current  medications. Prior to Admission:  Prescriptions Prior to Admission  Medication Sig Dispense Refill Last Dose  . aspirin 81 MG tablet Take 1 tablet (81 mg total) by mouth daily. 30 tablet  08/27/2017 at Unknown time  . calcipotriene (DOVONOX) 0.005 % cream Apply 1 application topically 2 (two) times daily.    08/26/2017  . Calcium Citrate-Vitamin D (CALCIUM CITRATE + D PO) Take 1 capsule by mouth 2 (two) times daily.     08/27/2017 at Unknown time  . clobetasol ointment (TEMOVATE) 3.08 % Apply 1 application topically 2 (two) times daily.   08/27/2017  . DULoxetine (CYMBALTA) 30 MG capsule Take 1 capsule (30 mg total) by mouth daily. 90 capsule 1 08/26/2017 at Unknown time  . gabapentin (NEURONTIN) 300 MG capsule Take 1 capsule (300 mg total) by mouth 3 (three) times daily. 270 capsule 1 08/27/2017 at Unknown time  . glucose blood test strip Test 2 times daily with Accu-Chek monitor 100 each 3 08/30/2017 at Unknown time  . Guselkumab 100 MG/ML SOSY 100 mg. Once every 8 weeks   08/26/2017  . ibuprofen (ADVIL,MOTRIN) 200 MG tablet Take 200 mg by mouth every 6 (six) hours as needed for mild pain.   Past Week at Unknown time  . Insulin Glargine (LANTUS SOLOSTAR) 100 UNIT/ML Solostar Pen Inject 38 Units into the skin daily at 10 pm. 15 mL 4 08/27/2017 at Unknown time  . Insulin Pen Needle 33G X 8 MM MISC 1 application by Does not apply route daily. 100 each 11 08/27/2017 at Unknown time  . metFORMIN (GLUCOPHAGE) 1000 MG tablet Take 1 tablet (1,000 mg total) by mouth 2 (two) times daily with a meal. 180 tablet 3 08/26/2017  . metoprolol tartrate (LOPRESSOR) 25 MG tablet Take 1 tablet (25 mg total) by mouth 2 (two) times daily. 180 tablet 1 08/26/2017  . predniSONE (DELTASONE) 20 MG tablet Take 2 tablets (40mg ) 10/10 at 6pm and 11pm; 10/11 (40mg ) in the morning 6 tablet 0 09/09/2017 at 0630  . simvastatin (ZOCOR) 40 MG tablet TAKE 1 TABLET BY MOUTH AT BEDTIME 90 tablet 1 08/26/2017   Scheduled: .  heparin-papaverine-plasmalyte irrigation   Irrigation To OR  . magnesium sulfate  40 mEq Other To OR  . potassium chloride  80 mEq Other To OR  . tranexamic acid  15 mg/kg Intravenous To OR  . tranexamic acid  2 mg/kg Intracatheter To OR   Continuous: . sodium chloride    . cefUROXime (ZINACEF)  IV    . cefUROXime (ZINACEF)  IV    . dexmedetomidine    . DOPamine    .  epinephrine    . heparin 30,000 units/NS 1000 mL solution for CELLSAVER    . insulin (NOVOLIN-R) infusion    . nitroGLYCERIN    . phenylephrine 20mg /229mL NS (0.08mg /ml) infusion    . tranexamic acid (CYKLOKAPRON) infusion ([facility])    . vancomycin     PRN: Anti-infectives    Start     Dose/Rate Route Frequency Ordered Stop   09/09/2017 1100  vancomycin (VANCOCIN) 1,250 mg in sodium chloride 0.9 % 250 mL IVPB     1,250 mg 166.7 mL/hr over 90 Minutes Intravenous To Surgery 08/24/2017 1056 08/29/17 1100   08/29/2017 1100  cefUROXime (ZINACEF) 1.5 g in dextrose 5 % 50 mL IVPB     1.5 g 100 mL/hr over 30 Minutes Intravenous To Surgery 09/15/2017 1056 08/29/17 1100   08/27/2017 1100  cefUROXime (ZINACEF) 750 mg in dextrose 5 % 50 mL IVPB     750 mg 100 mL/hr over 30 Minutes Intravenous To Surgery 08/29/2017 1056 08/29/17 1100      Results for orders placed or performed during the hospital encounter of 08/27/2017 (from the past 48 hour(s))  Glucose, capillary     Status: Abnormal   Collection Time: 08/27/2017  8:05 AM  Result Value Ref Range   Glucose-Capillary 222 (H) 65 - 99 mg/dL    No results found.  Review of Systems  Respiratory: Negative for shortness of breath.   Cardiovascular: Positive for chest pain.  Gastrointestinal: Negative for nausea and vomiting.  Neurological: Negative for dizziness and loss of consciousness.   Blood pressure 136/66, pulse 85, resp. rate 20, SpO2 92 %. Physical Exam  Constitutional: She is oriented to person, place, and time. She appears well-developed and well-nourished.  HENT:  Head:  Normocephalic and atraumatic.  Mouth/Throat: Oropharynx is clear and moist.  Eyes: Pupils are equal, round, and reactive to light. EOM are normal.  Neck: No JVD present.  Cardiovascular: Normal rate, regular rhythm, normal heart sounds and intact distal pulses.   No murmur heard. Respiratory: Effort normal and breath sounds normal. No respiratory distress.  GI: Soft. Bowel sounds are normal. She exhibits no distension. There is no tenderness.  Musculoskeletal: She exhibits no edema.  Neurological: She is alert and oriented to person, place, and time.  Skin: Skin is warm and dry.  Psychiatric: She has a normal mood and affect.    Assessment/Plan:  She has an acute iatrogenic aortic root/ascending aorta dissection with RCA dissection. This will require emergent CABG to the RCA, repair of aortic root and ascending aortic dissection, possible AVR. I discussed the operative procedure with the patient and her husband including alternatives, benefits and risks; including but not limited to bleeding, blood transfusion, infection, stroke, myocardial infarction, graft failure, heart block requiring a permanent pacemaker, organ dysfunction, and death.  Caroline Gray and her husband understand and agree to proceed. Husband has signed the consent for her.   I spent 30 minutes performing this consultation and > 50% of this time was spent face to face counseling and coordinating the care of this patient's acute aortic dissection and RCA dissection.  Caroline Gray 08/31/2017, 11:25 AM

## 2017-08-28 NOTE — Brief Op Note (Addendum)
08/18/2017  3:29 PM  PATIENT:  Caroline Gray  71 y.o. female  PRE-OPERATIVE DIAGNOSIS: 1. RIGHT CORONARY ARTERY DISSECTION 2. PRESUMED AORTIC ROOT DISSECTION   POST-OPERATIVE DIAGNOSIS:  1. RIGHT CORONARY ARTERY DISSECTION    PROCEDURE: INTRAOPERATIVE TRANSESOPHAGEAL ECHOCARDIOGRAM EMERGENT MEDIAN STERNOTOMY for  REPAIR OF RIGHT CORONARY ARTERY DISSECTION via CORONARY ARTERY BYPASS GRAFTING (CABG) x 1 (SGV to RCA) using left greater saphenous vein harvested endoscopically    SURGEON:  Surgeon(s) and Role:    * Bartle, Fernande Boyden, MD - Primary  PHYSICIAN ASSISTANT: Lars Pinks PA-C  ANESTHESIA:   general  EBL:  Total I/O In: 1600 [I.V.:1600] Out: 345 [Urine:345]  COUNTS CORRECT:  YES  DICTATION: .Dragon Dictation  PLAN OF CARE: Admit to inpatient   PATIENT DISPOSITION:  ICU - intubated and hemodynamically stable.   Delay start of Pharmacological VTE agent (>24hrs) due to surgical blood loss or risk of bleeding: yes  BASELINE WEIGHT: 78 kg

## 2017-08-28 NOTE — Anesthesia Procedure Notes (Signed)
Central Venous Catheter Insertion Performed by: Catalina Gravel, anesthesiologist Start/End10/30/2018 11:45 AM, 08/28/2017 11:50 AM Patient location: Pre-op. Preanesthetic checklist: patient identified, IV checked, site marked, risks and benefits discussed, surgical consent, monitors and equipment checked, pre-op evaluation, timeout performed and anesthesia consent Hand hygiene performed  and maximum sterile barriers used  Total catheter length 100. PA cath was placed.Swan type:thermodilution PA Cath depth:49 Procedure performed using ultrasound guided technique. Ultrasound Notes:anatomy identified, needle tip was noted to be adjacent to the nerve/plexus identified, no ultrasound evidence of intravascular and/or intraneural injection and image(s) printed for medical record Attempts: 1 Patient tolerated the procedure well with no immediate complications.

## 2017-08-28 NOTE — Anesthesia Preprocedure Evaluation (Signed)
Anesthesia Evaluation  Patient identified by MRN, date of birth, ID band Patient awake    Reviewed: Allergy & Precautions, NPO status , Patient's Chart, lab work & pertinent test results, reviewed documented beta blocker date and time   Airway Mallampati: II  TM Distance: >3 FB Neck ROM: Full    Dental  (+) Teeth Intact, Dental Advisory Given   Pulmonary former smoker,    Pulmonary exam normal breath sounds clear to auscultation       Cardiovascular hypertension, Pt. on home beta blockers and Pt. on medications + angina + CAD  Normal cardiovascular exam+ dysrhythmias (complete heart block)  Rhythm:Regular Rate:Normal     Neuro/Psych  Headaches, PSYCHIATRIC DISORDERS Anxiety Depression  Neuromuscular disease    GI/Hepatic negative GI ROS, Neg liver ROS,   Endo/Other  diabetes, Type 2, Oral Hypoglycemic Agents, Insulin DependentObesity   Renal/GU negative Renal ROS     Musculoskeletal negative musculoskeletal ROS (+)   Abdominal   Peds  Hematology negative hematology ROS (+)   Anesthesia Other Findings Day of surgery medications reviewed with the patient.  Reproductive/Obstetrics                             Anesthesia Physical Anesthesia Plan  ASA: IV and emergent  Anesthesia Plan: General   Post-op Pain Management:    Induction: Intravenous  PONV Risk Score and Plan: 3 and Ondansetron, Midazolam, Propofol infusion and Treatment may vary due to age or medical condition  Airway Management Planned: Oral ETT  Additional Equipment: Arterial line, CVP, PA Cath, TEE and Ultrasound Guidance Line Placement  Intra-op Plan:   Post-operative Plan: Post-operative intubation/ventilation  Informed Consent: I have reviewed the patients History and Physical, chart, labs and discussed the procedure including the risks, benefits and alternatives for the proposed anesthesia with the patient or  authorized representative who has indicated his/her understanding and acceptance.   Dental advisory given  Plan Discussed with: CRNA  Anesthesia Plan Comments:         Anesthesia Quick Evaluation

## 2017-08-28 NOTE — Anesthesia Procedure Notes (Signed)
Central Venous Catheter Insertion Performed by: Catalina Gravel, anesthesiologist Start/End10/04/2017 11:35 AM, 08/22/2017 11:45 AM Patient location: Pre-op. Preanesthetic checklist: patient identified, IV checked, site marked, risks and benefits discussed, surgical consent, monitors and equipment checked, pre-op evaluation, timeout performed and anesthesia consent Position: supine Hand hygiene performed  and maximum sterile barriers used  Catheter size: 9 Fr Central line was placed.MAC introducer Procedure performed using ultrasound guided technique. Ultrasound Notes:anatomy identified, needle tip was noted to be adjacent to the nerve/plexus identified, no ultrasound evidence of intravascular and/or intraneural injection and image(s) printed for medical record Attempts: 1 Following insertion, line sutured, dressing applied and Biopatch. Post procedure assessment: blood return through all ports, free fluid flow and no air  Patient tolerated the procedure well with no immediate complications.

## 2017-08-28 NOTE — Progress Notes (Signed)
Called MD Roxy Manns due to pt's CI 1.3 for last 2 hours, Hgb 8.2, and platelets 63. Currently on 44mcg of Neo and DDD paced at 90 with underlying rate SB 55.  Gave verbal orders for milrinone @0 .3 mcg and 1 albumin infusion. Will continue to monitor.

## 2017-08-28 NOTE — Transfer of Care (Signed)
Immediate Anesthesia Transfer of Care Note  Patient: Caroline Gray  Procedure(s) Performed: REPAIR OF RIGHT CORONARY ARTERY DISSECTION AND REPAIR OF AORTIC ROOT DISSECTION (N/A Chest) CORONARY ARTERY BYPASS GRAFTING (CABG) x 1, using left greater saphenous vein harvested endoscopically (N/A Chest) INTRAOPERATIVE TRANSESOPHAGEAL ECHOCARDIOGRAM (N/A Chest)  Patient Location: SICU  Anesthesia Type:General  Level of Consciousness: sedated and Patient remains intubated per anesthesia plan  Airway & Oxygen Therapy: Patient remains intubated per anesthesia plan and Patient placed on Ventilator (see vital sign flow sheet for setting)  Post-op Assessment: Report given to RN and Post -op Vital signs reviewed and stable  Post vital signs: Reviewed and stable  Last Vitals:  Vitals:   08/24/2017 1110 08/18/2017 1111  BP:    Pulse: 84 85  Resp: (!) 21 20  SpO2: 92% 92%    Last Pain: There were no vitals filed for this visit.       Complications: No apparent anesthesia complications   Patient transported to ICU with standard monitors (HR, BP, SPO2, RR) and emergency drugs/equipment. Controlled ventilation maintained via ambu bag. Report given to bedside RN and respiratory therapist. Pt connected to ICU monitor and ventilator. All questions answered and vital signs stable before leaving

## 2017-08-28 NOTE — Anesthesia Procedure Notes (Signed)
Arterial Line Insertion Start/End10/03/2017 11:55 AM, 08/28/2017 12:00 PM Performed by: Catalina Gravel, anesthesiologist  Patient location: Pre-op. Preanesthetic checklist: patient identified, IV checked, site marked, risks and benefits discussed, surgical consent, monitors and equipment checked, pre-op evaluation, timeout performed and anesthesia consent Lidocaine 1% used for infiltration Right, brachial was placed Catheter size: 20 Fr Hand hygiene performed  and maximum sterile barriers used   Attempts: 1 Procedure performed using ultrasound guided technique. Ultrasound Notes:anatomy identified, needle tip was noted to be adjacent to the nerve/plexus identified, no ultrasound evidence of intravascular and/or intraneural injection and image(s) printed for medical record Following insertion, dressing applied, Biopatch and line sutured. Post procedure assessment: normal and unchanged  Patient tolerated the procedure well with no immediate complications.

## 2017-08-28 NOTE — Anesthesia Procedure Notes (Signed)
Arterial Line Insertion Start/End10/13/2018 11:23 AM, 08/28/2017 12:28 PM Performed by: Catalina Gravel, anesthesiologist  Patient location: Pre-op. Preanesthetic checklist: patient identified, IV checked, site marked, risks and benefits discussed, surgical consent, monitors and equipment checked, pre-op evaluation, timeout performed and anesthesia consent Left, radial was placed Catheter size: 20 Fr Hand hygiene performed , maximum sterile barriers used  and Seldinger technique used  Attempts: 1 Procedure performed using ultrasound guided technique. Ultrasound Notes:anatomy identified, needle tip was noted to be adjacent to the nerve/plexus identified, no ultrasound evidence of intravascular and/or intraneural injection and image(s) printed for medical record Following insertion, dressing applied and Biopatch. Post procedure assessment: normal and unchanged  Patient tolerated the procedure well with no immediate complications.

## 2017-08-29 ENCOUNTER — Encounter (HOSPITAL_COMMUNITY): Payer: Self-pay | Admitting: Surgery

## 2017-08-29 ENCOUNTER — Other Ambulatory Visit (HOSPITAL_COMMUNITY): Payer: Medicare Other

## 2017-08-29 ENCOUNTER — Inpatient Hospital Stay (HOSPITAL_COMMUNITY): Payer: Medicare Other

## 2017-08-29 LAB — GLUCOSE, CAPILLARY
GLUCOSE-CAPILLARY: 90 mg/dL (ref 65–99)
GLUCOSE-CAPILLARY: 77 mg/dL (ref 65–99)
GLUCOSE-CAPILLARY: 85 mg/dL (ref 65–99)
GLUCOSE-CAPILLARY: 99 mg/dL (ref 65–99)
GLUCOSE-CAPILLARY: 128 mg/dL — AB (ref 65–99)
GLUCOSE-CAPILLARY: 127 mg/dL — AB (ref 65–99)
GLUCOSE-CAPILLARY: 181 mg/dL — AB (ref 65–99)
GLUCOSE-CAPILLARY: 187 mg/dL — AB (ref 65–99)
GLUCOSE-CAPILLARY: 153 mg/dL — AB (ref 65–99)
GLUCOSE-CAPILLARY: 154 mg/dL — AB (ref 65–99)
Glucose-Capillary: 109 mg/dL — ABNORMAL HIGH (ref 65–99)
Glucose-Capillary: 135 mg/dL — ABNORMAL HIGH (ref 65–99)
Glucose-Capillary: 149 mg/dL — ABNORMAL HIGH (ref 65–99)
Glucose-Capillary: 168 mg/dL — ABNORMAL HIGH (ref 65–99)
Glucose-Capillary: 168 mg/dL — ABNORMAL HIGH (ref 65–99)
Glucose-Capillary: 186 mg/dL — ABNORMAL HIGH (ref 65–99)
Glucose-Capillary: 204 mg/dL — ABNORMAL HIGH (ref 65–99)
Glucose-Capillary: 213 mg/dL — ABNORMAL HIGH (ref 65–99)
Glucose-Capillary: 85 mg/dL (ref 65–99)

## 2017-08-29 LAB — CBC
HEMATOCRIT: 22.1 % — AB (ref 36.0–46.0)
HEMATOCRIT: 29.4 % — AB (ref 36.0–46.0)
HEMOGLOBIN: 7.3 g/dL — AB (ref 12.0–15.0)
Hemoglobin: 9.6 g/dL — ABNORMAL LOW (ref 12.0–15.0)
MCH: 32.2 pg (ref 26.0–34.0)
MCH: 31.3 pg (ref 26.0–34.0)
MCHC: 33 g/dL (ref 30.0–36.0)
MCHC: 32.7 g/dL (ref 30.0–36.0)
MCV: 97.4 fL (ref 78.0–100.0)
MCV: 95.8 fL (ref 78.0–100.0)
PLATELETS: 62 10*3/uL — AB (ref 150–400)
Platelets: 61 10*3/uL — ABNORMAL LOW (ref 150–400)
RBC: 2.27 MIL/uL — ABNORMAL LOW (ref 3.87–5.11)
RBC: 3.07 MIL/uL — ABNORMAL LOW (ref 3.87–5.11)
RDW: 15.9 % — ABNORMAL HIGH (ref 11.5–15.5)
RDW: 17.7 % — AB (ref 11.5–15.5)
WBC: 15.9 10*3/uL — ABNORMAL HIGH (ref 4.0–10.5)
WBC: 20.7 10*3/uL — ABNORMAL HIGH (ref 4.0–10.5)

## 2017-08-29 LAB — POCT I-STAT 3, ART BLOOD GAS (G3+)
ACID-BASE DEFICIT: 1 mmol/L (ref 0.0–2.0)
ACID-BASE EXCESS: 2 mmol/L (ref 0.0–2.0)
Acid-base deficit: 2 mmol/L (ref 0.0–2.0)
Acid-base deficit: 2 mmol/L (ref 0.0–2.0)
Acid-base deficit: 2 mmol/L (ref 0.0–2.0)
BICARBONATE: 23.1 mmol/L (ref 20.0–28.0)
BICARBONATE: 23.9 mmol/L (ref 20.0–28.0)
Bicarbonate: 24.5 mmol/L (ref 20.0–28.0)
Bicarbonate: 23.8 mmol/L (ref 20.0–28.0)
Bicarbonate: 27.3 mmol/L (ref 20.0–28.0)
O2 SAT: 98 %
O2 SAT: 95 %
O2 SAT: 94 %
O2 Saturation: 99 %
O2 Saturation: 96 %
PCO2 ART: 39 mmHg (ref 32.0–48.0)
PCO2 ART: 47.2 mmHg (ref 32.0–48.0)
PCO2 ART: 46.5 mmHg (ref 32.0–48.0)
PH ART: 7.378 (ref 7.350–7.450)
PH ART: 7.326 — AB (ref 7.350–7.450)
PH ART: 7.321 — AB (ref 7.350–7.450)
PH ART: 7.358 (ref 7.350–7.450)
PO2 ART: 160 mmHg — AB (ref 83.0–108.0)
PO2 ART: 85 mmHg (ref 83.0–108.0)
PO2 ART: 78 mmHg — AB (ref 83.0–108.0)
Patient temperature: 37.5
Patient temperature: 37.5
TCO2: 24 mmol/L (ref 22–32)
TCO2: 26 mmol/L (ref 22–32)
TCO2: 25 mmol/L (ref 22–32)
TCO2: 29 mmol/L (ref 22–32)
TCO2: 25 mmol/L (ref 22–32)
pCO2 arterial: 48.8 mmHg — ABNORMAL HIGH (ref 32.0–48.0)
pCO2 arterial: 46.1 mmHg (ref 32.0–48.0)
pH, Arterial: 7.325 — ABNORMAL LOW (ref 7.350–7.450)
pO2, Arterial: 119 mmHg — ABNORMAL HIGH (ref 83.0–108.0)
pO2, Arterial: 86 mmHg (ref 83.0–108.0)

## 2017-08-29 LAB — CREATININE, SERUM
Creatinine, Ser: 1.18 mg/dL — ABNORMAL HIGH (ref 0.44–1.00)
GFR calc non Af Amer: 45 mL/min — ABNORMAL LOW (ref >60)
GFR, EST AFRICAN AMERICAN: 52 mL/min — AB (ref >60)

## 2017-08-29 LAB — POCT I-STAT, CHEM 8
BUN: 18 mg/dL (ref 6–20)
CALCIUM ION: 1.03 mmol/L — AB (ref 1.15–1.40)
CREATININE: 1.1 mg/dL — AB (ref 0.44–1.00)
Chloride: 103 mmol/L (ref 101–111)
GLUCOSE: 167 mg/dL — AB (ref 65–99)
HCT: 29 % — ABNORMAL LOW (ref 36.0–46.0)
Hemoglobin: 9.9 g/dL — ABNORMAL LOW (ref 12.0–15.0)
Potassium: 4.7 mmol/L (ref 3.5–5.1)
Sodium: 139 mmol/L (ref 135–145)
TCO2: 21 mmol/L — AB (ref 22–32)

## 2017-08-29 LAB — BASIC METABOLIC PANEL
ANION GAP: 7 (ref 5–15)
BUN: 13 mg/dL (ref 6–20)
CHLORIDE: 110 mmol/L (ref 101–111)
CO2: 24 mmol/L (ref 22–32)
CREATININE: 0.78 mg/dL (ref 0.44–1.00)
Calcium: 7.3 mg/dL — ABNORMAL LOW (ref 8.9–10.3)
GFR calc non Af Amer: >60 mL/min (ref >60)
Glucose, Bld: 83 mg/dL (ref 65–99)
Potassium: 4.5 mmol/L (ref 3.5–5.1)
Sodium: 141 mmol/L (ref 135–145)

## 2017-08-29 LAB — COOXEMETRY PANEL
CARBOXYHEMOGLOBIN: 0.7 % (ref 0.5–1.5)
CARBOXYHEMOGLOBIN: 1 % (ref 0.5–1.5)
METHEMOGLOBIN: 1.1 % (ref 0.0–1.5)
Methemoglobin: 1.3 % (ref 0.0–1.5)
O2 SAT: 62.9 %
O2 SAT: 61.8 %
TOTAL HEMOGLOBIN: 7.1 g/dL — AB (ref 12.0–16.0)
Total hemoglobin: 6.4 g/dL — CL (ref 12.0–16.0)

## 2017-08-29 LAB — MAGNESIUM
Magnesium: 2.8 mg/dL — ABNORMAL HIGH (ref 1.7–2.4)
Magnesium: 2.3 mg/dL (ref 1.7–2.4)

## 2017-08-29 LAB — PREPARE RBC (CROSSMATCH)

## 2017-08-29 MED ORDER — INSULIN ASPART 100 UNIT/ML ~~LOC~~ SOLN
0.0000 [IU] | SUBCUTANEOUS | Status: DC
  Administered 2017-08-29: 3 [IU] via SUBCUTANEOUS
  Administered 2017-08-30: 2 [IU] via SUBCUTANEOUS
  Administered 2017-08-30: 3 [IU] via SUBCUTANEOUS
  Administered 2017-08-30: 5 [IU] via SUBCUTANEOUS
  Administered 2017-08-30: 3 [IU] via SUBCUTANEOUS
  Administered 2017-08-30: 5 [IU] via SUBCUTANEOUS
  Administered 2017-08-31: 2 [IU] via SUBCUTANEOUS

## 2017-08-29 MED ORDER — INSULIN DETEMIR 100 UNIT/ML ~~LOC~~ SOLN
20.0000 [IU] | Freq: Every day | SUBCUTANEOUS | Status: DC
  Administered 2017-08-29 – 2017-09-01 (×4): 20 [IU] via SUBCUTANEOUS
  Filled 2017-08-29 (×5): qty 0.2

## 2017-08-29 MED ORDER — FUROSEMIDE 10 MG/ML IJ SOLN
40.0000 mg | Freq: Once | INTRAMUSCULAR | Status: AC
Start: 2017-08-29 — End: 2017-08-29
  Administered 2017-08-29: 40 mg via INTRAVENOUS
  Filled 2017-08-29: qty 4

## 2017-08-29 MED ORDER — SODIUM CHLORIDE 0.9 % IV SOLN
0.0000 ug/min | INTRAVENOUS | Status: DC
  Administered 2017-08-29: 125 ug/min via INTRAVENOUS
  Administered 2017-08-30: 10 ug/min via INTRAVENOUS
  Administered 2017-08-31: 40 ug/min via INTRAVENOUS
  Filled 2017-08-29 (×7): qty 4

## 2017-08-29 MED ORDER — SODIUM CHLORIDE 0.9 % IV SOLN
Freq: Once | INTRAVENOUS | Status: AC
  Administered 2017-08-29: 09:00:00 via INTRAVENOUS

## 2017-08-29 MED ORDER — PNEUMOCOCCAL VAC POLYVALENT 25 MCG/0.5ML IJ INJ: 0.5000 mL | INJECTION | INTRAMUSCULAR | Status: DC

## 2017-08-29 MED ORDER — NOREPINEPHRINE BITARTRATE 1 MG/ML IV SOLN
0.0000 ug/min | INTRAVENOUS | Status: DC
  Administered 2017-08-29: 5 ug/min via INTRAVENOUS
  Administered 2017-08-31: 9.5 ug/min via INTRAVENOUS
  Filled 2017-08-29 (×4): qty 16

## 2017-08-29 MED FILL — Sodium Bicarbonate IV Soln 8.4%: INTRAVENOUS | Qty: 50 | Status: AC

## 2017-08-29 MED FILL — Lidocaine HCl IV Inj 20 MG/ML: INTRAVENOUS | Qty: 5 | Status: AC

## 2017-08-29 MED FILL — Electrolyte-R (PH 7.4) Solution: INTRAVENOUS | Qty: 4000 | Status: AC

## 2017-08-29 MED FILL — Calcium Chloride Inj 10%: INTRAVENOUS | Qty: 10 | Status: AC

## 2017-08-29 MED FILL — Heparin Sodium (Porcine) Inj 1000 Unit/ML: INTRAMUSCULAR | Qty: 10 | Status: AC

## 2017-08-29 MED FILL — Sodium Chloride IV Soln 0.9%: INTRAVENOUS | Qty: 2000 | Status: AC

## 2017-08-29 MED FILL — Mannitol IV Soln 20%: INTRAVENOUS | Qty: 500 | Status: AC

## 2017-08-29 NOTE — Care Management Note (Signed)
Case Management Note Marvetta Gibbons RN, BSN Unit 4E-Case Manager-- [place] coverage [phone]  Patient Details  Name: Caroline Gray MRN: [id number] Date of Birth: [birth year]  Subjective/Objective:  Pt admitted REPAIR OF RIGHT CORONARY ARTERY DISSECTION AND REPAIR OF AORTIC ROOT DISSECTION (N/A) CORONARY ARTERY BYPASS GRAFTING (CABG) x 1, on 09/08/2017  Action/Plan: PTA pt lived at home with spouse- CM to follow for d/c needs  Expected Discharge Date:                  Expected Discharge Plan:     In-House Referral:     Discharge planning Services  CM Consult  Post Acute Care Choice:    Choice offered to:     DME Arranged:    DME Agency:     HH Arranged:    HH Agency:     Status of Service:  In process, will continue to follow  If discussed at Long Length of Stay Meetings, dates discussed:    Discharge Disposition:   Additional Comments:  Dawayne Patricia, RN 08/29/2017, 11:24 AM

## 2017-08-29 NOTE — Progress Notes (Signed)
      [address]       [place] [address]             [phone]      POD # 1 repair aortic root dissection/ CABG x 1  Extubated earlier today  BP (!) 105/32   Pulse 86   Temp 99.5 F (37.5 C)   Resp (!) 0   Wt 181 lb 10.5 oz (82.4 kg)   SpO2 96%   BMI 32.18 kg/m   Intake/Output Summary (Last 24 hours) at 08/29/17 1739 Last data filed at 08/29/17 1700  Gross per 24 hour  Intake          4338.29 ml  Output             1880 ml  Net          2458.29 ml   96% sat on HFNC at 10 L/min 28/13 CI= 2.2 On milrinone at 0.2, requiring levophed and neo to maintain BP, will decrease milrinone  Creatinine 1.10 Hct=29  Remo Lipps C. Roxan Hockey, MD [facility] [phone]

## 2017-08-29 NOTE — Op Note (Signed)
CARDIOVASCULAR SURGERY OPERATIVE NOTE  08/29/2017  Surgeon:  Gaye Pollack, MD  First Assistant: Lars Pinks, [facility]   Preoperative Diagnosis:  Severe multi-vessel coronary artery disease   Postoperative Diagnosis:  Same   Procedure:  1. Median Sternotomy 2. Extracorporeal circulation 3.   Coronary artery bypass grafting x 1   SVG to distal RCA  4.   Endoscopic vein harvest from the left leg 5.   Right axillary artery exposure with 8 mm Hemashield graft anastomosed to axillary artery for arterial inflow. 6.   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    Anesthesia:  General Endotracheal   Clinical History/Surgical Indication:  The patient is a 71 year old woman with DM, HL, HTN, with a history of vague chest pain and shortness of breath with exertion who underwent elective cath by Dr. Fletcher Anon at [facility] this am. This showed some mild proximal RCA stenosis that looked less than 50% to me. 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 She started having CP and bradycardia and had pacer inserted. Dr. Fletcher Anon called me in the office and patient transferred emergently to [facility] for surgery. She is still having some chest pain and ST elevation o monitor. She is in sinus 80's. I discussed the operative procedure with the patient and her husband including alternatives, benefits and risks; including but not limited to bleeding, blood transfusion, infection, stroke, myocardial infarction, graft failure, heart block requiring a permanent pacemaker, organ dysfunction, and death.  Caroline Gray and her husband understand and agree to proceed. Husband has signed the consent for her.    Preparation:  The patient was seen in the preoperative holding area and the correct  patient, correct operation were confirmed with the patient after reviewing the medical record and catheterization. The consent was signed by me. Preoperative antibiotics were given. The patient was taken back to the operating room and positioned supine on the operating room table. After being placed under general endotracheal anesthesia by the anesthesia team a foley catheter was placed. A pulmonary arterial line, left radial arterial line, and right brachial arterial line were placed by the anesthesia team after anesthetic induction. The abdomen, and both legs were prepped with betadine soap and solution and draped in the usual sterile manner. A surgical time-out was taken and the correct patient and operative procedure were confirmed with the nursing and anesthesia staff.  TEE: performed by Dr. Hoy Morn. This showed a focal aortic dissection in the right coronary aortic sinus. The remainder of the ascending aorta looked normal.There was calcified plaque in the aortic arch but no dissection. The aorta was normal size. LV function was moderately depressed with inferior dyskinesis. The aortic valve looked normal with no AI. There was mild MR. RV function looked good.  Cardiopulmonary Bypass:  A median sternotomy was performed. The pericardium was opened in the midline. Right ventricular function appeared normal. The ascending aorta was of normal size. There was ecchymosis over the aortic root and proximal ascending aorta. There was no blood in the pericardium. There was calcific plaque in the proximal aortic arch around the origin of the innominate artery and beyond. Therefore I decided to use right axillary artery cannulation.   Right axillary artery exposure and cannulation:  A transverse incision was made below the right clavicle. The pectoralis major muscle was split along its fibers and the pectoralis minor muscle was  retracted laterally. The brachial plexus was identified and gently retracted  laterally to expose the axillary artery. The artery was controlled proximally and distally with vessel loops. The patient was fully heparinized and ACT maintained greater than 400. The axillary artery was clamped proximally and distally with peripheral Debakey clamps. It was opened longitudinally. There was no sign of dissection here. An 8 mm Hemashield dacron graft was anastomosed in an end to side manner using continuous 5-0 prolene suture. CoSeal was applied for hemostasis and the clamp removed. The graft was connected to the arterial end of the bypass circuit.   Venous cannulation was performed via the right atrial appendage using a two-staged venous cannula. A left ventricular vent was placed via the right superior pulmonary vein. A retrograde cardioplegia cannula was placed in the coronary sinus via the right atrium. Aortic occlusion was performed with a single cross-clamp. Systemic cooling to 32 degrees Centigrade and topical cooling of the heart with iced saline were used. Hyperkalemic retrograde cold blood cardioplegia was used to induce diastolic arrest but did not cool the heart below 20 degrees and therefore the aorta was opened and cold blood cardioplegia was given directly into the left main using a handheld cannula carefully. It was then given at about 20 minute intervals throughout the period of arrest to maintain myocardial temperature at or below 10 degrees centigrade. After the RCA graft was placed cardioplegia was also given down that graft at 20 minute intervals.  A temperature probe was inserted into the interventricular septum and an insulating pad was placed in the pericardium.   Endoscopic vein harvest:  The left greater saphenous vein was harvested endoscopically through a 2 cm incision medial to the left knee. It was harvested from the upper thigh to below the knee. It was a medium-sized vein of good quality. The side branches were all ligated with 4-0 silk ties. We initially  inspected the right greater saphenous vein at the knee and it was small and we decided to use the left vein.   Coronary arteries:  The coronary arteries were examined.   LAD:  No significant disease  LCX:  No significant disease RCA:  The RCA was discolored down into the distal vessel suggesting that the dissection extended into the distal vessel. The PDA was a small to moderate sized vessel and it looked like the dissection may have extended into this vessel.  Grafts:    SVG to distal RCA:  2.5 mm. It was sewn end to side using 7-0 prolene continuous suture. I opened the RCA distally where it was still a large vessel and there was dissection there. I entered the true lumen and sewed the graft approximating the dissection flap to close the false lumen and perfuse only the true lumen. This graft had good flow and visually was perfusing the inferior wall well.   The proximal vein graft anastomosis was performed to the mid-ascending aorta using continuous 6-0 prolene suture. A graft marker was placed around the proximal anastomosis.   Repair of aortic dissection:  The ascending aorta was transected completely about 1 cm above the STJ to allow mobilization of the aortic root and visualization of the ostium of the RCA. The aortic valve looked normal. There was a small tear at the ostium of the RCA with a focal dissection limited to the right coronary sinus of the aortic root. The tear was closed using two pledgetted 4-0 prolene horizontal mattress sutures. This also closed the ostium of the RCA but  the vessel had already been grafted distally. I did not think that any further intervention was required for the focal dissection in the right sinus since it was limited and the opening into it had been closed.   Completion:  The patient was rewarmed to 37 degrees Centigrade.  The crossclamp was removed with a time of 90 minutes. There was spontaneous return of sinus rhythm. The distal and proximal  anastomoses were checked for hemostasis. The position of the graft was satisfactory. Two temporary epicardial pacing wires were placed on the right atrium and two on the right ventricle. The patient was weaned from CPB without difficulty on low dose dopamine inotropes. CPB time was 118 minutes. Cardiac output was 4 LPM. TEE showed a normally functioning aortic valve with no AI. There was unchanged mild MR. LV function appeared slightly improved inferiorly. RV function looked good. Heparin was fully reversed with protamine and the venous cannula and LV vent removed. The right axillary artery graft was ligated near the anastomosis with a 0-silk suture and divided. The stump was suture ligated with a pledgetted 3-0 prolene mattress suture. Hemostasis was achieved. Mediastinal drainage tubes were placed. The sternum was closed with  #6 stainless steel wires. The fascia was closed with continuous # 1 vicryl suture. The subcutaneous tissue was closed with 2-0 vicryl continuous suture. The skin was closed with 3-0 vicryl subcuticular suture. All sponge, needle, and instrument counts were reported correct at the end of the case. Dry sterile dressings were placed over the incisions and around the chest tubes which were connected to pleurevac suction. The patient was then transported to the surgical intensive care unit in critical but stable condition.

## 2017-08-29 NOTE — Progress Notes (Signed)
Late entry-  RN called d/t pt desat 82%.  Pt w/ cold extremities, placed sat probe on ear= no reading.  Pt placed on HFNC 10 lpm.  RN  Placed forehead sat probe, sat now 97-100%.

## 2017-08-29 NOTE — Progress Notes (Signed)
1 Day Post-Op Procedure(s) (LRB): REPAIR OF RIGHT CORONARY ARTERY DISSECTION AND REPAIR OF AORTIC ROOT DISSECTION (N/A) CORONARY ARTERY BYPASS GRAFTING (CABG) x 1, using left greater saphenous vein harvested endoscopically (N/A) INTRAOPERATIVE TRANSESOPHAGEAL ECHOCARDIOGRAM (N/A) Subjective: No complaints. Just extubated at 6:50 this am  Objective: Vital signs in last 24 hours: Temp:  [97.7 F (36.5 C)-99.5 F (37.5 C)] 99.3 F (37.4 C) (10/12 0800) Pulse Rate:  [73-114] 90 (10/12 0800) Cardiac Rhythm: A-V Sequential paced;Sinus bradycardia (10/12 0800) Resp:  [12-27] 24 (10/12 0800) BP: (73-157)/(42-84) 84/48 (10/12 0800) SpO2:  [91 %-100 %] 97 % (10/12 0800) Arterial Line BP: (75-212)/(37-92) 88/39 (10/12 0800) FiO2 (%):  [40 %-50 %] 40 % (10/12 0600) Weight:  [82.4 kg (181 lb 10.5 oz)] 82.4 kg (181 lb 10.5 oz) (10/12 0300)  Hemodynamic parameters for last 24 hours: PAP: (15-34)/(8-21) 31/19 CO:  [2.3 L/min-3.8 L/min] 3.8 L/min CI:  [1.3 L/min/m2-2.1 L/min/m2] 2.1 L/min/m2  Intake/Output from previous day: 10/11 0701 - 10/12 0700 In: 5043.9 [I.V.:3685.9; Blood:608; IV Piggyback:750] Out: 3115 [Urine:1560; Emesis/NG output:100; Blood:1215; Chest Tube:240] Intake/Output this shift: Total I/O In: 262 [I.V.:262] Out: 20 [Urine:20]  General appearance: alert and cooperative Neurologic: intact Heart: regular rate and rhythm, S1, S2 normal, no murmur, click, rub or gallop Lungs: clear to auscultation bilaterally Extremities: edema mild Wound: dressings dry. Ecchymosis of left thigh from vein harvest  Lab Results:  Recent Labs  09/13/2017 2119 08/29/17 0319  WBC 19.9* 15.9*  HGB 8.2* 7.3*  HCT 24.2* 22.1*  PLT 63* 61*   BMET:  Recent Labs  09/13/2017 2119 08/29/17 0319  NA 138 141  K 4.4 4.5  CL 109 110  CO2 22 24  GLUCOSE 146* 83  BUN 12 13  CREATININE 0.83 0.78  CALCIUM 7.3* 7.3*    PT/INR:  Recent Labs  08/29/2017 1721  LABPROT 19.5*  INR 1.66   ABG    Component Value Date/Time   PHART 7.358 08/29/2017 0635   HCO3 27.3 08/29/2017 0635   TCO2 29 08/29/2017 0635   ACIDBASEDEF 2.0 08/29/2017 0558   O2SAT 96.0 08/29/2017 0635   CBG (last 3)   Recent Labs  08/29/17 0358 08/29/17 0505 08/29/17 0558  GLUCAP 77 85 99   CXR: ok  ECG: junctional rhythm, incomplete RBBB, non-specific ST changes  Assessment/Plan: S/P Procedure(s) (LRB): REPAIR OF RIGHT CORONARY ARTERY DISSECTION AND REPAIR OF AORTIC ROOT DISSECTION (N/A) CORONARY ARTERY BYPASS GRAFTING (CABG) x 1, using left greater saphenous vein harvested endoscopically (N/A) INTRAOPERATIVE TRANSESOPHAGEAL ECHOCARDIOGRAM (N/A)  POD 1  She has been hemodynamically labile overnight and is on 130 mcg of neo this am. Hold off on beta blocker. CI >2 on milrinone 0.3.  Expected acute postop blood loss anemia: Hgb 7.3. With high dose vasopressor need and recent MI will transfuse 2 units PRBC's this am and diurese.  DM: glucose under good control on insulin drip. Will give a dose of Levemir and switch to SSI.  Keep chest tubes in for now.   LOS: 1 day    Gaye Pollack 08/29/2017

## 2017-08-29 NOTE — Progress Notes (Signed)
Removed Femoral venous sheath with Lenise [facility] RN. Pt tolerated procedure without side effects. Site clean and intact. Gauze dressing applied

## 2017-08-29 NOTE — Progress Notes (Signed)
Pt suctioned and extubated to 4lpm cannula at 0650.  Pt tolerated well with pt vocalizing post extubation.  Pulmonary mechanics done prior to extubation with FVC 900, VT 500, Nif -33.

## 2017-08-29 NOTE — Plan of Care (Signed)
Problem: Pain Managment: Goal: General experience of comfort will improve Outcome: Progressing POD 2 requiring pain medication for deep breathing coughing  Problem: Physical Regulation: Goal: Ability to maintain clinical measurements within normal limits will improve Outcome: Not Progressing On multiple vasoactive medications to maintain blood pressure  Problem: Activity: Goal: Risk for activity intolerance will decrease Outcome: Progressing Small incremental activities  Problem: Nutrition: Goal: Adequate nutrition will be maintained Outcome: Progressing Clear liquids

## 2017-08-30 ENCOUNTER — Encounter (HOSPITAL_COMMUNITY): Payer: Self-pay | Admitting: *Deleted

## 2017-08-30 DIAGNOSIS — Z951 Presence of aortocoronary bypass graft: Secondary | ICD-10-CM

## 2017-08-30 LAB — COOXEMETRY PANEL
CARBOXYHEMOGLOBIN: 1 % (ref 0.5–1.5)
CARBOXYHEMOGLOBIN: 1 % (ref 0.5–1.5)
Methemoglobin: 1.2 % (ref 0.0–1.5)
Methemoglobin: 1.1 % (ref 0.0–1.5)
O2 SAT: 65.1 %
O2 Saturation: 67.8 %
Total hemoglobin: 9.3 g/dL — ABNORMAL LOW (ref 12.0–16.0)
Total hemoglobin: 9 g/dL — ABNORMAL LOW (ref 12.0–16.0)

## 2017-08-30 LAB — GLUCOSE, CAPILLARY
GLUCOSE-CAPILLARY: 108 mg/dL — AB (ref 65–99)
GLUCOSE-CAPILLARY: 119 mg/dL — AB (ref 65–99)
GLUCOSE-CAPILLARY: 236 mg/dL — AB (ref 65–99)
GLUCOSE-CAPILLARY: 86 mg/dL (ref 65–99)
GLUCOSE-CAPILLARY: 94 mg/dL (ref 65–99)
GLUCOSE-CAPILLARY: 94 mg/dL (ref 65–99)
Glucose-Capillary: 106 mg/dL — ABNORMAL HIGH (ref 65–99)
Glucose-Capillary: 142 mg/dL — ABNORMAL HIGH (ref 65–99)
Glucose-Capillary: 159 mg/dL — ABNORMAL HIGH (ref 65–99)
Glucose-Capillary: 204 mg/dL — ABNORMAL HIGH (ref 65–99)
Glucose-Capillary: 158 mg/dL — ABNORMAL HIGH (ref 65–99)

## 2017-08-30 LAB — BASIC METABOLIC PANEL
Anion gap: 6 (ref 5–15)
BUN: 19 mg/dL (ref 6–20)
CALCIUM: 7.2 mg/dL — AB (ref 8.9–10.3)
CO2: 22 mmol/L (ref 22–32)
Chloride: 108 mmol/L (ref 101–111)
Creatinine, Ser: 1.14 mg/dL — ABNORMAL HIGH (ref 0.44–1.00)
GFR calc Af Amer: 54 mL/min — ABNORMAL LOW (ref >60)
GFR, EST NON AFRICAN AMERICAN: 47 mL/min — AB (ref >60)
GLUCOSE: 92 mg/dL (ref 65–99)
Potassium: 3.9 mmol/L (ref 3.5–5.1)
Sodium: 136 mmol/L (ref 135–145)

## 2017-08-30 LAB — CBC
HEMATOCRIT: 29.3 % — AB (ref 36.0–46.0)
HEMOGLOBIN: 9.6 g/dL — AB (ref 12.0–15.0)
MCH: 31.3 pg (ref 26.0–34.0)
MCHC: 32.8 g/dL (ref 30.0–36.0)
MCV: 95.4 fL (ref 78.0–100.0)
Platelets: 51 10*3/uL — ABNORMAL LOW (ref 150–400)
RBC: 3.07 MIL/uL — AB (ref 3.87–5.11)
RDW: 18.1 % — ABNORMAL HIGH (ref 11.5–15.5)
WBC: 21.3 10*3/uL — ABNORMAL HIGH (ref 4.0–10.5)

## 2017-08-30 MED ORDER — FUROSEMIDE 10 MG/ML IJ SOLN
20.0000 mg | Freq: Two times a day (BID) | INTRAMUSCULAR | Status: DC
  Administered 2017-08-30 – 2017-09-01 (×6): 20 mg via INTRAVENOUS
  Filled 2017-08-30 (×6): qty 2

## 2017-08-30 MED ORDER — ALBUMIN HUMAN 5 % IV SOLN
12.5000 g | Freq: Once | INTRAVENOUS | Status: AC
Start: 2017-08-30 — End: 2017-08-30
  Administered 2017-08-30: 12.5 g via INTRAVENOUS
  Filled 2017-08-30: qty 250

## 2017-08-30 NOTE — Progress Notes (Signed)
      [address]       [place] [address]             [phone]      Sleeping currently  BP (!) 107/41   Pulse 90   Temp 99.4 F (37.4 C) (Oral)   Resp (!) 28   Ht 5\' 3"  (1.6 m)   Wt 195 lb 15.8 oz (88.9 kg)   SpO2 92%   BMI 34.72 kg/m    Intake/Output Summary (Last 24 hours) at 08/30/17 1751 Last data filed at 08/30/17 1700  Gross per 24 hour  Intake          2064.93 ml  Output             1110 ml  Net           269.48 ml   Systolic BP better but diastolic remains relatively low  Co-ox= 65  Caymen Dubray C. Roxan Hockey, MD [facility] [phone]

## 2017-08-30 NOTE — Progress Notes (Signed)
2 Days Post-Op Procedure(s) (LRB): REPAIR OF RIGHT CORONARY ARTERY DISSECTION AND REPAIR OF AORTIC ROOT DISSECTION (N/A) CORONARY ARTERY BYPASS GRAFTING (CABG) x 1, using left greater saphenous vein harvested endoscopically (N/A) INTRAOPERATIVE TRANSESOPHAGEAL ECHOCARDIOGRAM (N/A) Subjective: Comfortable  Objective: Vital signs in last 24 hours: Temp:  [98.2 F (36.8 C)-100.2 F (37.9 C)] 98.2 F (36.8 C) (10/13 0853) Pulse Rate:  [75-95] 90 (10/13 0853) Cardiac Rhythm: Atrial paced (10/13 0800) Resp:  [0-31] 23 (10/13 0853) BP: (75-132)/(32-81) 101/40 (10/13 0800) SpO2:  [91 %-98 %] 95 % (10/13 0853) Arterial Line BP: (70-141)/(31-45) 93/32 (10/13 0853) Weight:  [195 lb 15.8 oz (88.9 kg)] 195 lb 15.8 oz (88.9 kg) (10/13 0530)  Hemodynamic parameters for last 24 hours: PAP: (24-38)/(10-22) 28/15 CO:  [3.9 L/min-5 L/min] 3.9 L/min CI:  [0 L/min/m2-2.8 L/min/m2] 2.2 L/min/m2  Intake/Output from previous day: 10/12 0701 - 10/13 0700 In: 3648.5 [P.O.:480; I.V.:2421.5; Blood:597; IV Piggyback:150] Out: 4742 [Urine:820; Chest Tube:345] Intake/Output this shift: Total I/O In: 70 [I.V.:20; IV Piggyback:50] Out: 40 [Urine:40]  General appearance: alert and cooperative Neurologic: intact Heart: regular rate and rhythm Lungs: diminished breath sounds bibasilar Abdomen: normal findings: soft, non-tender  Lab Results:  Recent Labs  08/29/17 1518 08/29/17 1526 08/30/17 0400  WBC 20.7*  --  21.3*  HGB 9.6* 9.9* 9.6*  HCT 29.4* 29.0* 29.3*  PLT 62*  --  51*   BMET:  Recent Labs  08/29/17 0319  08/29/17 1526 08/30/17 0400  NA 141  --  139 136  K 4.5  --  4.7 3.9  CL 110  --  103 108  CO2 24  --   --  22  GLUCOSE 83  --  167* 92  BUN 13  --  18 19  CREATININE 0.78  < > 1.10* 1.14*  CALCIUM 7.3*  --   --  7.2*  < > = values in this interval not displayed.  PT/INR:  Recent Labs  09/11/2017 1721  LABPROT 19.5*  INR 1.66   ABG    Component Value Date/Time   PHART  7.325 (L) 08/29/2017 0759   HCO3 23.9 08/29/2017 0759   TCO2 21 (L) 08/29/2017 1526   ACIDBASEDEF 2.0 08/29/2017 0759   O2SAT 67.8 08/30/2017 0416   CBG (last 3)   Recent Labs  08/30/17 0409 08/30/17 0549 08/30/17 0856  GLUCAP 94 86 142*    Assessment/Plan: S/P Procedure(s) (LRB): REPAIR OF RIGHT CORONARY ARTERY DISSECTION AND REPAIR OF AORTIC ROOT DISSECTION (N/A) CORONARY ARTERY BYPASS GRAFTING (CABG) x 1, using left greater saphenous vein harvested endoscopically (N/A) INTRAOPERATIVE TRANSESOPHAGEAL ECHOCARDIOGRAM (N/A) -POD # 2  CV- BP still relatively low  Good cardiac output with low filling pressure, co-ox= 67  Dc milrinone. Wean neo and levophed as tolerated  RESP_ IS for atelectasis  RENAL- creatinine up minimally  Volume overloaded- diurese as BP allows  ENDO_ CBG well controlled  Anemia secondary to ABL- better post transfusion  Dc chest tubes  Mobilize    LOS: 2 days    Melrose Nakayama 08/30/2017

## 2017-08-30 NOTE — Plan of Care (Signed)
Problem: Pain Managment: Goal: General experience of comfort will improve Outcome: Progressing Denies pain  Problem: Bowel/Gastric: Goal: Gastrointestinal status for postoperative course will improve Outcome: Progressing Passing gas; no nausea

## 2017-08-30 NOTE — Plan of Care (Signed)
Problem: Cardiac: Goal: Hemodynamic stability will improve Outcome: Progressing Pt weaned off neo and insulin gtt Goal: Ability to maintain an adequate cardiac output will improve Outcome: Completed/Met Date Met: 08/30/17 CI>2 Goal: Will show no signs and symptoms of excessive bleeding Outcome: Progressing No signs of bleeding  Problem: Physical Regulation: Goal: Diagnostic test results will improve Outcome: Progressing All labs WNL  Problem: Respiratory: Goal: Levels of oxygenation will improve Pt on 10L high flow O2 Goal: Respiratory status will improve Outcome: Not Met (add Reason) Pt continues on highflow O2 Goal: Ability to tolerate decreased levels of ventilator support will improve Outcome: Completed/Met Date Met: 08/30/17 Pt extubated  Problem: Pain Management: Goal: Pain level will decrease Outcome: Not Progressing Minimal pain  Problem: Tissue Perfusion: Goal: Risk of venous thrombosis will decrease Outcome: Progressing scds  Problem: Urinary Elimination: Goal: Ability to achieve and maintain adequate renal perfusion and functioning will improve Outcome: Progressing Pt with adequate urinary output

## 2017-08-31 ENCOUNTER — Inpatient Hospital Stay (HOSPITAL_COMMUNITY): Payer: Medicare Other

## 2017-08-31 LAB — BASIC METABOLIC PANEL
Anion gap: 5 (ref 5–15)
BUN: 22 mg/dL — ABNORMAL HIGH (ref 6–20)
CALCIUM: 7.2 mg/dL — AB (ref 8.9–10.3)
CO2: 24 mmol/L (ref 22–32)
CREATININE: 1.02 mg/dL — AB (ref 0.44–1.00)
Chloride: 108 mmol/L (ref 101–111)
GFR calc Af Amer: >60 mL/min (ref >60)
GFR, EST NON AFRICAN AMERICAN: 53 mL/min — AB (ref >60)
Glucose, Bld: 146 mg/dL — ABNORMAL HIGH (ref 65–99)
Potassium: 4.2 mmol/L (ref 3.5–5.1)
Sodium: 137 mmol/L (ref 135–145)

## 2017-08-31 LAB — POCT I-STAT 3, ART BLOOD GAS (G3+)
Acid-base deficit: 1 mmol/L (ref 0.0–2.0)
Bicarbonate: 24.9 mmol/L (ref 20.0–28.0)
O2 SAT: 98 %
PCO2 ART: 50 mmHg — AB (ref 32.0–48.0)
PO2 ART: 119 mmHg — AB (ref 83.0–108.0)
Patient temperature: 37.5
TCO2: 26 mmol/L (ref 22–32)
pH, Arterial: 7.308 — ABNORMAL LOW (ref 7.350–7.450)

## 2017-08-31 LAB — CBC
HCT: 27.9 % — ABNORMAL LOW (ref 36.0–46.0)
Hemoglobin: 9 g/dL — ABNORMAL LOW (ref 12.0–15.0)
MCH: 31.4 pg (ref 26.0–34.0)
MCHC: 32.3 g/dL (ref 30.0–36.0)
MCV: 97.2 fL (ref 78.0–100.0)
PLATELETS: 40 10*3/uL — AB (ref 150–400)
RBC: 2.87 MIL/uL — ABNORMAL LOW (ref 3.87–5.11)
RDW: 17.4 % — AB (ref 11.5–15.5)
WBC: 14.6 10*3/uL — ABNORMAL HIGH (ref 4.0–10.5)

## 2017-08-31 LAB — COOXEMETRY PANEL
Carboxyhemoglobin: 0.8 % (ref 0.5–1.5)
Methemoglobin: 1.4 % (ref 0.0–1.5)
O2 SAT: 65.9 %
TOTAL HEMOGLOBIN: 9.2 g/dL — AB (ref 12.0–16.0)

## 2017-08-31 LAB — GLUCOSE, CAPILLARY
Glucose-Capillary: 134 mg/dL — ABNORMAL HIGH (ref 65–99)
Glucose-Capillary: 136 mg/dL — ABNORMAL HIGH (ref 65–99)
Glucose-Capillary: 142 mg/dL — ABNORMAL HIGH (ref 65–99)
Glucose-Capillary: 143 mg/dL — ABNORMAL HIGH (ref 65–99)

## 2017-08-31 LAB — ECHO INTRAOPERATIVE TEE

## 2017-08-31 MED ORDER — INSULIN ASPART 100 UNIT/ML ~~LOC~~ SOLN: 0.0000 [IU] | Freq: Three times a day (TID) | SUBCUTANEOUS | Status: DC

## 2017-08-31 MED ORDER — INSULIN ASPART 100 UNIT/ML ~~LOC~~ SOLN: 0.0000 [IU] | Freq: Every day | SUBCUTANEOUS | Status: DC

## 2017-08-31 MED ORDER — ORAL CARE MOUTH RINSE
15.0000 mL | Freq: Two times a day (BID) | OROMUCOSAL | Status: DC
  Administered 2017-08-31 – 2017-09-02 (×4): 15 mL via OROMUCOSAL

## 2017-08-31 MED ORDER — INSULIN ASPART 100 UNIT/ML ~~LOC~~ SOLN
0.0000 [IU] | Freq: Three times a day (TID) | SUBCUTANEOUS | Status: DC
  Administered 2017-08-31 (×3): 2 [IU] via SUBCUTANEOUS
  Administered 2017-09-01: 3 [IU] via SUBCUTANEOUS
  Administered 2017-09-01: 2 [IU] via SUBCUTANEOUS

## 2017-08-31 NOTE — Progress Notes (Signed)
3 Days Post-Op Procedure(s) (LRB): REPAIR OF RIGHT CORONARY ARTERY DISSECTION AND REPAIR OF AORTIC ROOT DISSECTION (N/A) CORONARY ARTERY BYPASS GRAFTING (CABG) x 1, using left greater saphenous vein harvested endoscopically (N/A) INTRAOPERATIVE TRANSESOPHAGEAL ECHOCARDIOGRAM (N/A) Subjective: Feels weak  Objective: Vital signs in last 24 hours: Temp:  [97.5 F (36.4 C)-100.1 F (37.8 C)] 100.1 F (37.8 C) (10/14 0903) Pulse Rate:  [51-113] 95 (10/14 0700) Cardiac Rhythm: Normal sinus rhythm;Atrial paced (10/14 0400) Resp:  [0-32] 17 (10/14 0700) BP: (85-134)/(37-67) 100/47 (10/14 0700) SpO2:  [90 %-99 %] 97 % (10/14 0700) Arterial Line BP: (92-145)/(30-45) 112/36 (10/14 0700) Weight:  [192 lb 10.9 oz (87.4 kg)] 192 lb 10.9 oz (87.4 kg) (10/14 0615)  Hemodynamic parameters for last 24 hours: PAP: (29-38)/(5-23) 34/5  Intake/Output from previous day: 10/13 0701 - 10/14 0700 In: 1078.2 [P.O.:100; I.V.:678.2; IV Piggyback:300] Out: 1510 [Urine:1400; Chest Tube:110] Intake/Output this shift: No intake/output data recorded.  General appearance: alert, cooperative and no distress Neurologic: intact Heart: regular rate and rhythm Lungs: diminished breath sounds bibasilar Abdomen: normal findings: soft, non-tender  Lab Results:  Recent Labs  08/30/17 0400 08/31/17 0340  WBC 21.3* 14.6*  HGB 9.6* 9.0*  HCT 29.3* 27.9*  PLT 51* 40*   BMET:  Recent Labs  08/30/17 0400 08/31/17 0340  NA 136 137  K 3.9 4.2  CL 108 108  CO2 22 24  GLUCOSE 92 146*  BUN 19 22*  CREATININE 1.14* 1.02*  CALCIUM 7.2* 7.2*    PT/INR:  Recent Labs  09/03/2017 1721  LABPROT 19.5*  INR 1.66   ABG    Component Value Date/Time   PHART 7.325 (L) 08/29/2017 0759   HCO3 23.9 08/29/2017 0759   TCO2 21 (L) 08/29/2017 1526   ACIDBASEDEF 2.0 08/29/2017 0759   O2SAT 65.9 08/31/2017 0345   CBG (last 3)   Recent Labs  08/30/17 1921 08/30/17 2302 08/31/17 0338  GLUCAP 204* 158* 134*     Assessment/Plan: S/P Procedure(s) (LRB): REPAIR OF RIGHT CORONARY ARTERY DISSECTION AND REPAIR OF AORTIC ROOT DISSECTION (N/A) CORONARY ARTERY BYPASS GRAFTING (CABG) x 1, using left greater saphenous vein harvested endoscopically (N/A) INTRAOPERATIVE TRANSESOPHAGEAL ECHOCARDIOGRAM (N/A) -  CV- BP still relatively low, has been able to wean some on levophed, still on neo as well  Co-ox= 66  RESP_ bibasilar atelectasis and bilateral effusions  Continue IS, flutter and diuresis  RENAL- creatinine OK, continue low dose IV lasix  ENDO- CBG better- change to AC/HS, continue levemir  Thrombocytopenia- PLT count down to 40K- no bleeding  HIT panel pending, no evidence of thrombosis  LOS: 3 days    Melrose Nakayama 08/31/2017

## 2017-08-31 NOTE — Plan of Care (Signed)
Problem: Skin Integrity: Goal: Risk for impaired skin integrity will decrease Outcome: Progressing OOB to chair and ambulate 2x  Problem: Tissue Perfusion: Goal: Risk factors for ineffective tissue perfusion will decrease Outcome: Progressing Off levo; pressures stable on neo  Problem: Activity: Goal: Risk for activity intolerance will decrease Outcome: Progressing OOB to chair ambulate 2x  Problem: Coping: Goal: Ability to adjust to condition or change in health will improve Outcome: Progressing Patient is less anxious and more optimistic about recovery

## 2017-08-31 NOTE — Progress Notes (Signed)
      [address]       [place] [address]             [phone]      Up in chair, walked a little earlier  BP (!) 103/55   Pulse 89   Temp 98.2 F (36.8 C) (Oral)   Resp 20   Ht 5\' 3"  (1.6 m)   Wt 192 lb 10.9 oz (87.4 kg)   SpO2 99%   BMI 34.13 kg/m    Intake/Output Summary (Last 24 hours) at 08/31/17 1812 Last data filed at 08/31/17 1720  Gross per 24 hour  Intake           1493.8 ml  Output             1825 ml  Net           -331.2 ml   CBG well controlled  Remo Lipps C. Roxan Hockey, MD [facility] [phone]

## 2017-08-31 NOTE — Plan of Care (Signed)
Problem: Activity: Goal: Risk for activity intolerance will decrease Outcome: Progressing Patient able to sit up in chair for most of day and ambulate in hallway

## 2017-08-31 NOTE — Plan of Care (Signed)
Problem: Nutrition: Goal: Adequate nutrition will be maintained Outcome: Progressing Diet advanced to carb modified and tolerating

## 2017-08-31 NOTE — Anesthesia Postprocedure Evaluation (Signed)
Anesthesia Post Note  Patient: Caroline Gray  Procedure(s) Performed: REPAIR OF RIGHT CORONARY ARTERY DISSECTION AND REPAIR OF AORTIC ROOT DISSECTION (N/A Chest) CORONARY ARTERY BYPASS GRAFTING (CABG) x 1, using left greater saphenous vein harvested endoscopically (N/A Chest) INTRAOPERATIVE TRANSESOPHAGEAL ECHOCARDIOGRAM (N/A Chest)     Patient location during evaluation: SICU Anesthesia Type: General Level of consciousness: sedated Pain management: pain level controlled Vital Signs Assessment: post-procedure vital signs reviewed and stable Respiratory status: patient remains intubated per anesthesia plan Cardiovascular status: stable Postop Assessment: no apparent nausea or vomiting Anesthetic complications: no    Last Vitals:  Vitals:   08/31/17 0700 08/31/17 0903  BP: (!) 100/47   Pulse: 95   Resp: 17   Temp:  37.8 C  SpO2: 97%     Last Pain:  Vitals:   08/31/17 0903  TempSrc: Oral  PainSc:                  Catalina Gravel

## 2017-09-01 ENCOUNTER — Inpatient Hospital Stay (HOSPITAL_COMMUNITY): Payer: Medicare Other

## 2017-09-01 DIAGNOSIS — I9789 Other postprocedural complications and disorders of the circulatory system, not elsewhere classified: Secondary | ICD-10-CM

## 2017-09-01 DIAGNOSIS — I4891 Unspecified atrial fibrillation: Secondary | ICD-10-CM

## 2017-09-01 DIAGNOSIS — I361 Nonrheumatic tricuspid (valve) insufficiency: Secondary | ICD-10-CM

## 2017-09-01 LAB — BASIC METABOLIC PANEL
ANION GAP: 6 (ref 5–15)
BUN: 21 mg/dL — ABNORMAL HIGH (ref 6–20)
CHLORIDE: 108 mmol/L (ref 101–111)
CO2: 26 mmol/L (ref 22–32)
CREATININE: 0.76 mg/dL (ref 0.44–1.00)
Calcium: 7.2 mg/dL — ABNORMAL LOW (ref 8.9–10.3)
GFR calc non Af Amer: >60 mL/min (ref >60)
Glucose, Bld: 102 mg/dL — ABNORMAL HIGH (ref 65–99)
POTASSIUM: 3.9 mmol/L (ref 3.5–5.1)
SODIUM: 140 mmol/L (ref 135–145)

## 2017-09-01 LAB — COOXEMETRY PANEL
Carboxyhemoglobin: 1.2 % (ref 0.5–1.5)
Methemoglobin: 1.4 % (ref 0.0–1.5)
O2 SAT: 56.4 %
TOTAL HEMOGLOBIN: 8.9 g/dL — AB (ref 12.0–16.0)

## 2017-09-01 LAB — CBC
HCT: 27.8 % — ABNORMAL LOW (ref 36.0–46.0)
Hemoglobin: 8.9 g/dL — ABNORMAL LOW (ref 12.0–15.0)
MCH: 31.9 pg (ref 26.0–34.0)
MCHC: 32 g/dL (ref 30.0–36.0)
MCV: 99.6 fL (ref 78.0–100.0)
PLATELETS: 40 10*3/uL — AB (ref 150–400)
RBC: 2.79 MIL/uL — ABNORMAL LOW (ref 3.87–5.11)
RDW: 17.6 % — AB (ref 11.5–15.5)
WBC: 13.3 10*3/uL — AB (ref 4.0–10.5)

## 2017-09-01 LAB — BPAM RBC
BLOOD PRODUCT EXPIRATION DATE: 201811052359
BLOOD PRODUCT EXPIRATION DATE: 201811052359
Blood Product Expiration Date: 201811052359
Blood Product Expiration Date: 201811062359
ISSUE DATE / TIME: 201810111222
ISSUE DATE / TIME: 201810120819
ISSUE DATE / TIME: 201810121033
UNIT TYPE AND RH: 5100
Unit Type and Rh: 5100
Unit Type and Rh: 5100
Unit Type and Rh: 5100

## 2017-09-01 LAB — TYPE AND SCREEN
ABO/RH(D): O POS
ANTIBODY SCREEN: NEGATIVE
UNIT DIVISION: 0
UNIT DIVISION: 0
Unit division: 0
Unit division: 0

## 2017-09-01 LAB — GLUCOSE, CAPILLARY
GLUCOSE-CAPILLARY: 158 mg/dL — AB (ref 65–99)
Glucose-Capillary: 124 mg/dL — ABNORMAL HIGH (ref 65–99)
Glucose-Capillary: 109 mg/dL — ABNORMAL HIGH (ref 65–99)
Glucose-Capillary: 139 mg/dL — ABNORMAL HIGH (ref 65–99)
Glucose-Capillary: 126 mg/dL — ABNORMAL HIGH (ref 65–99)
Glucose-Capillary: 127 mg/dL — ABNORMAL HIGH (ref 65–99)

## 2017-09-01 LAB — HEPARIN INDUCED PLATELET AB (HIT ANTIBODY): HEPARIN INDUCED PLT AB: 0.142 {OD_unit} (ref 0.000–0.400)

## 2017-09-01 LAB — ECHOCARDIOGRAM COMPLETE
Height: 63 in
Weight: 3060.8 oz

## 2017-09-01 MED ORDER — AMIODARONE HCL IN DEXTROSE 360-4.14 MG/200ML-% IV SOLN
INTRAVENOUS | Status: AC
  Filled 2017-09-01: qty 200

## 2017-09-01 MED ORDER — PERFLUTREN LIPID MICROSPHERE
1.0000 mL | INTRAVENOUS | Status: AC | PRN
  Administered 2017-09-01: 2 mL via INTRAVENOUS

## 2017-09-01 MED ORDER — AMIODARONE HCL IN DEXTROSE 360-4.14 MG/200ML-% IV SOLN
60.0000 mg/h | INTRAVENOUS | Status: AC
  Administered 2017-09-01: 60 mg/h via INTRAVENOUS
  Filled 2017-09-01: qty 200

## 2017-09-01 MED ORDER — AMIODARONE HCL IN DEXTROSE 360-4.14 MG/200ML-% IV SOLN
30.0000 mg/h | INTRAVENOUS | Status: DC
  Administered 2017-09-01: 30 mg/h via INTRAVENOUS
  Filled 2017-09-01 (×2): qty 200

## 2017-09-01 MED ORDER — AMIODARONE LOAD VIA INFUSION
150.0000 mg | Freq: Once | INTRAVENOUS | Status: AC
  Administered 2017-09-01: 150 mg via INTRAVENOUS
  Filled 2017-09-01: qty 83.34

## 2017-09-01 NOTE — Progress Notes (Signed)
4 Days Post-Op Procedure(s) (LRB): REPAIR OF RIGHT CORONARY ARTERY DISSECTION AND REPAIR OF AORTIC ROOT DISSECTION (N/A) CORONARY ARTERY BYPASS GRAFTING (CABG) x 1, using left greater saphenous vein harvested endoscopically (N/A) INTRAOPERATIVE TRANSESOPHAGEAL ECHOCARDIOGRAM (N/A) Subjective: Went into atrial fib at the end of her walk this morning Feels a little better today  Objective: Vital signs in last 24 hours: Temp:  [97.6 F (36.4 C)-98.4 F (36.9 C)] 97.6 F (36.4 C) (10/15 0700) Pulse Rate:  [76-139] 127 (10/15 0758) Cardiac Rhythm: Atrial fibrillation (10/15 0758) Resp:  [14-35] 16 (10/15 0758) BP: (78-117)/(37-78) 97/57 (10/15 0758) SpO2:  [91 %-100 %] 97 % (10/15 0758) Arterial Line BP: (85-154)/(33-51) 117/46 (10/15 0758) Weight:  [191 lb 4.8 oz (86.8 kg)] 191 lb 4.8 oz (86.8 kg) (10/15 0500)  Hemodynamic parameters for last 24 hours:    Intake/Output from previous day: 10/14 0701 - 10/15 0700 In: 1596.5 [P.O.:1080; I.V.:516.5] Out: 1520 [Urine:1520] Intake/Output this shift: Total I/O In: 19.4 [I.V.:19.4] Out: 80 [Urine:80]  General appearance: alert, cooperative and no distress Neurologic: intact Heart: regular rate and rhythm Lungs: diminished breath sounds bibasilar Abdomen: normal findings: soft, non-tender Wound: clean and dry  Lab Results:  Recent Labs  08/31/17 0340 09/01/17 0340  WBC 14.6* 13.3*  HGB 9.0* 8.9*  HCT 27.9* 27.8*  PLT 40* 40*   BMET:  Recent Labs  08/31/17 0340 09/01/17 0340  NA 137 140  K 4.2 3.9  CL 108 108  CO2 24 26  GLUCOSE 146* 102*  BUN 22* 21*  CREATININE 1.02* 0.76  CALCIUM 7.2* 7.2*    PT/INR: No results for input(s): LABPROT, INR in the last 72 hours. ABG    Component Value Date/Time   PHART 7.325 (L) 08/29/2017 0759   HCO3 23.9 08/29/2017 0759   TCO2 21 (L) 08/29/2017 1526   ACIDBASEDEF 2.0 08/29/2017 0759   O2SAT 56.4 09/01/2017 0340   CBG (last 3)   Recent Labs  08/31/17 1536  08/31/17 2107 09/01/17 0739  GLUCAP 143* 124* 109*    Assessment/Plan: S/P Procedure(s) (LRB): REPAIR OF RIGHT CORONARY ARTERY DISSECTION AND REPAIR OF AORTIC ROOT DISSECTION (N/A) CORONARY ARTERY BYPASS GRAFTING (CABG) x 1, using left greater saphenous vein harvested endoscopically (N/A) INTRAOPERATIVE TRANSESOPHAGEAL ECHOCARDIOGRAM (N/A) -POD # 4 CV- went into atrial fib this AM, now back in SR after amiodarone bolus  On IV amiodarone  RESP- Bibasilar atelectasis, effusions  RENAL- creatinine stable  ENDO- CBG well controlled   Thrombocytopenia unchanged. I do not see a result for HIT panel- will reorder  LOS: 4 days    Melrose Nakayama 09/01/2017

## 2017-09-01 NOTE — Progress Notes (Signed)
  Echocardiogram 2D Echocardiogram has been performed.  Technically difficult exam. Post op. Patient unable to lay in accurate position for exam. Unable to view subcostal window due to surgical dressings.   Caroline Gray 09/01/2017, 11:36 AM

## 2017-09-01 NOTE — Progress Notes (Signed)
Patient ID: Caroline Gray, female   DOB: 11-15-1946, 71 y.o.   MRN: [id number] EVENING ROUNDS NOTE :     [address]       [place] [address]             [phone]                 4 Days Post-Op Procedure(s) (LRB): REPAIR OF RIGHT CORONARY ARTERY DISSECTION AND REPAIR OF AORTIC ROOT DISSECTION (N/A) CORONARY ARTERY BYPASS GRAFTING (CABG) x 1, using left greater saphenous vein harvested endoscopically (N/A) INTRAOPERATIVE TRANSESOPHAGEAL ECHOCARDIOGRAM (N/A)  Total Length of Stay:  LOS: 4 days  BP (!) 103/49 (BP Location: Right Arm)   Pulse 85   Temp 97.9 F (36.6 C) (Oral)   Resp (!) 23   Ht 5\' 3"  (1.6 m)   Wt 191 lb 4.8 oz (86.8 kg)   SpO2 96%   BMI 33.89 kg/m   .Intake/Output      10/14 0701 - 10/15 0700 10/15 0701 - 10/16 0700   P.O. 1080    I.V. (mL/kg) 516.5 (5.9) 269.6 (3.1)   Total Intake(mL/kg) 1596.5 (18.4) 269.6 (3.1)   Urine (mL/kg/hr) 1520 (0.7) 600 (0.6)   Total Output 1520 600   Net +76.5 -330.4        Stool Occurrence 1 x      . sodium chloride Stopped (08/30/17 1900)  . sodium chloride    . sodium chloride Stopped (08/30/17 1700)  . amiodarone 30 mg/hr (09/01/17 1430)  . lactated ringers Stopped (08/30/17 1900)  . lactated ringers 10 mL/hr at 09/01/17 1300  . nitroGLYCERIN Stopped (08/27/2017 1845)  . norepinephrine (LEVOPHED) Adult infusion Stopped (08/31/17 1130)  . phenylephrine (NEO-SYNEPHRINE) Adult infusion 18 mcg/min (09/01/17 1500)     Lab Results  Component Value Date   WBC 13.3 (H) 09/01/2017   HGB 8.9 (L) 09/01/2017   HCT 27.8 (L) 09/01/2017   PLT 40 (L) 09/01/2017   GLUCOSE 102 (H) 09/01/2017   CHOL 137 07/06/2015   TRIG 142.0 07/06/2015   HDL 33.30 (L) 07/06/2015   LDLDIRECT 74.7 11/05/2013   LDLCALC 75 07/06/2015   ALT 26 07/15/2017   AST 40 (H) 07/15/2017   NA 140 09/01/2017   K 3.9 09/01/2017   CL 108 09/01/2017   CREATININE 0.76 09/01/2017   BUN 21 (H) 09/01/2017   CO2 26 09/01/2017   INR 1.66  08/18/2017   HGBA1C 6.7 (H) 07/15/2017   MICROALBUR 4.7 (H) 07/15/2017   Renal function stable  Both hands swollen left more then right  Grace Isaac MD  Beeper [phone] Office [phone] 09/01/2017 6:00 PM

## 2017-09-01 NOTE — Progress Notes (Signed)
Progress Note  Patient Name: Caroline Gray Date of Encounter: 09/01/2017  Primary Cardiologist: Dr Fletcher Anon  Subjective   No chest pain. Has had some pain in the right shoulder area. Shortness of breath with walking this am. Went into AF at the end of her walk.  Inpatient Medications    Scheduled Meds: . acetaminophen  1,000 mg Oral Q6H   Or  . acetaminophen (TYLENOL) oral liquid 160 mg/5 mL  1,000 mg Per Tube Q6H  . aspirin EC  325 mg Oral Daily   Or  . aspirin  324 mg Per Tube Daily  . bisacodyl  10 mg Oral Daily   Or  . bisacodyl  10 mg Rectal Daily  . docusate sodium  200 mg Oral Daily  . DULoxetine  30 mg Oral Daily  . furosemide  20 mg Intravenous BID  . gabapentin  300 mg Oral TID  . insulin aspart  0-15 Units Subcutaneous TID WC  . insulin aspart  0-5 Units Subcutaneous QHS  . insulin detemir  20 Units Subcutaneous Daily  . mouth rinse  15 mL Mouth Rinse BID  . pantoprazole  40 mg Oral Daily  . pneumococcal 23 valent vaccine  0.5 mL Intramuscular Tomorrow-1000  . simvastatin  40 mg Oral QHS  . sodium chloride flush  3 mL Intravenous Q12H   Continuous Infusions: . sodium chloride Stopped (08/30/17 1900)  . sodium chloride    . sodium chloride Stopped (08/30/17 1700)  . amiodarone 60 mg/hr (09/01/17 0830)  . amiodarone     Followed by  . amiodarone    . lactated ringers Stopped (08/30/17 1900)  . lactated ringers 10 mL/hr at 09/01/17 0756  . nitroGLYCERIN Stopped (09/08/2017 1845)  . norepinephrine (LEVOPHED) Adult infusion Stopped (08/31/17 1130)  . phenylephrine (NEO-SYNEPHRINE) Adult infusion 25.067 mcg/min (09/01/17 0756)   PRN Meds: sodium chloride, metoprolol tartrate, morphine injection, ondansetron (ZOFRAN) IV, sodium chloride flush, traMADol   Vital Signs    Vitals:   09/01/17 0700 09/01/17 0715 09/01/17 0730 09/01/17 0758  BP: (!) 98/53  101/63 (!) 97/57  Pulse: (!) 124 98 (!) 121 (!) 127  Resp: (!) 22 19 18 16   Temp: 97.6 F (36.4 C)       TempSrc:      SpO2: 93% 95% 96% 97%  Weight:      Height:        Intake/Output Summary (Last 24 hours) at 09/01/17 0844 Last data filed at 09/01/17 0756  Gross per 24 hour  Intake          1343.75 ml  Output             1600 ml  Net          -256.25 ml   Filed Weights   08/30/17 0530 08/31/17 0615 09/01/17 0500  Weight: 195 lb 15.8 oz (88.9 kg) 192 lb 10.9 oz (87.4 kg) 191 lb 4.8 oz (86.8 kg)    Telemetry    Sinus rhythm to atrial fibrillation with RVR - Personally Reviewed   Physical Exam  Pleasant woman, age appropriate GEN: No acute distress.   Neck: No JVD Cardiac:  irregularly irregular, tachycardic, no murmurs, rubs, or gallops.  Respiratory: Clear to auscultation bilaterally. GI: Soft, nontender, non-distended  MS:  1+ diffuse edema; No deformity. Neuro:  Nonfocal  Psych: Normal affect   Labs    Chemistry Recent Labs Lab 08/30/17 0400 08/31/17 0340 09/01/17 0340  NA 136 137 140  K 3.9  4.2 3.9  CL 108 108 108  CO2 22 24 26   GLUCOSE 92 146* 102*  BUN 19 22* 21*  CREATININE 1.14* 1.02* 0.76  CALCIUM 7.2* 7.2* 7.2*  GFRNONAA 47* 53* >60  GFRAA 54* >60 >60  ANIONGAP 6 5 6      Hematology Recent Labs Lab 08/30/17 0400 08/31/17 0340 09/01/17 0340  WBC 21.3* 14.6* 13.3*  RBC 3.07* 2.87* 2.79*  HGB 9.6* 9.0* 8.9*  HCT 29.3* 27.9* 27.8*  MCV 95.4 97.2 99.6  MCH 31.3 31.4 31.9  MCHC 32.8 32.3 32.0  RDW 18.1* 17.4* 17.6*  PLT 51* 40* 40*    Cardiac EnzymesNo results for input(s): TROPONINI in the last 168 hours. No results for input(s): TROPIPOC in the last 168 hours.   BNPNo results for input(s): BNP, PROBNP in the last 168 hours.   DDimer No results for input(s): DDIMER in the last 168 hours.   Radiology    Dg Chest Port 1 View  Result Date: 09/01/2017 CLINICAL DATA:  Status post CABG 4 days ago EXAM: PORTABLE CHEST 1 VIEW COMPARISON:  Portable chest x-ray of August 31, 2017 FINDINGS: The lungs are adequately inflated. The interstitial  markings remain increased but have improved slightly. Bibasilar atelectasis and small bilateral pleural effusions persist. The cardiac silhouette remains enlarged. The pulmonary vascularity is less engorged and more distinct. The sternal wires are intact. The right internal jugular Cordis sheath tip projects over the proximal SVC. There is calcification in the wall of the aortic arch. IMPRESSION: Slight interval improvement in pulmonary interstitial edema. Persistent small bilateral pleural effusions and bibasilar atelectasis. Electronically Signed   By: David  Martinique M.D.   On: 09/01/2017 07:32   Dg Chest Port 1 View  Result Date: 08/31/2017 CLINICAL DATA:  Status post coronary bypass graft. EXAM: PORTABLE CHEST 1 VIEW COMPARISON:  Radiograph of August 29, 2017. FINDINGS: Stable cardiomegaly. No pneumothorax is noted. Endotracheal and nasogastric tubes have been removed. Swan-Ganz catheter has been removed. Right internal jugular sheath remains. Atherosclerosis of thoracic aorta is noted. Increased bibasilar edema or atelectasis is noted with increased pleural effusions. Bony thorax is unremarkable. IMPRESSION: Endotracheal and nasogastric tubes have been removed. Increased bibasilar edema or atelectasis is noted with increased pleural effusions. Electronically Signed   By: Marijo Conception, M.D.   On: 08/31/2017 07:15    Cardiac Studies   Cardiac Cath 08/26/2017: Conclusion     The left ventricular systolic function is normal.  LV end diastolic pressure is mildly elevated.  The left ventricular ejection fraction is 55-65% by visual estimate.  Prox RCA lesion, 65 %stenosed.  Mid LAD lesion, 20 %stenosed.  Mid Cx lesion, 30 %stenosed.   1. Borderline significant one-vessel coronary artery disease affecting the proximal right coronary artery. 2. Normal LV systolic function mildly elevated left ventricular end-diastolic pressure. 3. Iatrogenic dissection of the right coronary artery  extending into the ascending aorta was likely wire-induced. 4. 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  Recommendations: The patient needs emergent ascending aortic repair and 1 vessel CABG to the right coronary artery. The patient was transferred emergently to [facility] and I discussed the case with Dr. Cyndia Bent.     Patient Profile     71 y.o. female admitted 08/29/2017 with iatrogenic coronary artery and a ascending aortic dissection, underwent emergency CABG with a saphenous vein graft to distal RCA and suture repair of focal aortic root dissection  Assessment &  Plan    1. Acute inferior STEMI secondary to coronary artery dissection now status post single-vessel CABG with a SVG to RCA. Will check an echocardiogram to reassess LV function and evaluate any valvular abnormality since she has sustained an acute infarct. 2. 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 Thrombocytopenia, stable platelet count at 40,000 compared to yesterday 4. Postoperative anemia, stable with hemoglobin 8.9 mg/dL today 5. Type 2 diabetes: Managed in hospital with Levemir insulin and sliding scale 6. Hyperlipidemia: Treated with simvastatin 40 mg daily 7. Postoperative atrial fibrillation: Patient now on IV amiodarone. Review of her telemetry shows that her heart rate is better controlled and she is having intermittent sinus beats, hopeful she will convert back to sinus rhythm this morning. She is not a candidate for anticoagulation with a platelet count of 40,000 in a postoperative state.  For questions or updates, please contact [place] Please consult [email or web address] for contact info under Cardiology/STEMI.      Deatra James, MD  09/01/2017, 8:44 AM

## 2017-09-01 NOTE — H&P (Signed)
[address]       [place] [address]             [phone]      Cardiothoracic Surgery Admission History and Physical  Reason for Admission: Acute iatrogenic aortic root dissection and RCA dissection with acute RCA ischemia Referring Physician: Dr. Karlene Lineman Stepka is an 71 y.o. female.  HPI:   The patient is a 71 year old woman with DM, HL, HTN, with a history of vague chest pain and shortness of breath with exertion who underwent elective cath by Dr. Fletcher Anon at [facility] this am. This showed some mild proximal RCA stenosis that looked less than 50% to me. She had a 4F guide catheter inserted and a flow wire and subsequently developed RCA dissection with aortic root dissection. She started having CP and bradycardia and had pacer inserted. Dr. Fletcher Anon called me in the office and patient transferred emergently to [facility] for surgery. She is still having some chest pain and ST elevation o monitor. She is in sinus 80's.      Past Medical History:  Diagnosis Date  . Adenomatous colon polyp   . Anginal pain ([place])   . Anxiety   . Arthritis   . Cataract   . Diabetes mellitus   . Diverticulosis   . 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 History of kidney stones   . Hx: UTI (urinary tract infection)   . Hyperlipidemia   . Hypertension   . IBS (irritable bowel syndrome)   . Positive PPD   . Psoriasis          Past Surgical History:  Procedure Laterality Date  . ABDOMINAL HYSTERECTOMY    . ABDOMINAL HYSTERECTOMY    . APPENDECTOMY    . BREAST BIOPSY Right 1990   neg  . CATARACT EXTRACTION    . CHOLECYSTECTOMY    . CYSTOSCOPY WITH STENT PLACEMENT Bilateral 01/22/2017   Procedure: CYSTOSCOPY WITH STENT PLACEMENT;  Surgeon: Nickie Retort, MD;  Location: [facility] ORS;  Service: Urology;  Laterality: Bilateral;  . EYE SURGERY Bilateral    Cataract Extraction with IOL   . INTRAVASCULAR PRESSURE WIRE/FFR STUDY N/A 09/08/2017   Procedure:  INTRAVASCULAR PRESSURE WIRE/FFR STUDY;  Surgeon: Wellington Hampshire, MD;  Location: [place] CV LAB;  Service: Cardiovascular;  Laterality: N/A;  . LEFT HEART CATH AND CORONARY ANGIOGRAPHY Left 08/19/2017   Procedure: LEFT HEART CATH AND CORONARY ANGIOGRAPHY;  Surgeon: Wellington Hampshire, MD;  Location: [place] CV LAB;  Service: Cardiovascular;  Laterality: Left;  . LITHOTRIPSY  3.7.13   Dr Jacqlyn Larsen  . ROTATOR CUFF REPAIR Right 08/2005  . SPINE SURGERY    . URETEROSCOPY WITH HOLMIUM LASER LITHOTRIPSY Bilateral 01/22/2017   Procedure: URETEROSCOPY WITH HOLMIUM LASER LITHOTRIPSY;  Surgeon: Nickie Retort, MD;  Location: [facility] ORS;  Service: Urology;  Laterality: Bilateral;         Family History  Problem Relation Age of Onset  . Diabetes Mother   . Breast cancer Mother 70  . Heart attack Paternal Aunt   . Heart attack Paternal Uncle   . Heart attack Paternal Uncle   . Heart attack Paternal Uncle   . Heart attack Paternal Uncle   . Heart attack Paternal Aunt   . Heart attack Paternal Aunt   . Heart attack Paternal Aunt   . Other Son        Ulcerative Proctitis   . Colon cancer Neg Hx   .  Colon polyps Neg Hx   . Rectal cancer Neg Hx   . Stomach cancer Neg Hx     Social History:  reports that she quit smoking about 17 years ago. Her smoking use included Cigarettes. She quit after 15.00 years of use. She has never used smokeless tobacco. She reports that she drinks alcohol. She reports that she does not use drugs.  Allergies:       Allergies  Allergen Reactions  . Influenza Vaccines Other (See Comments)    Kandis Mannan due to condition unable to have ANY VACCINATIONS  . Codeine Nausea And Vomiting    Nausea and vomiting  . Macrodantin Nausea And Vomiting  . Ioxaglate Palpitations and Other (See Comments)    Shaking and rapid heart beat  . Ivp Dye [Iodinated Diagnostic Agents] Palpitations and Other (See Comments)    Shaking and rapid  heart beat Has had since this reaction and been okay     Medications:  I have reviewed the patient's current medications. Prior to Admission:         Prescriptions Prior to Admission  Medication Sig Dispense Refill Last Dose  . aspirin 81 MG tablet Take 1 tablet (81 mg total) by mouth daily. 30 tablet  08/27/2017 at Unknown time  . calcipotriene (DOVONOX) 0.005 % cream Apply 1 application topically 2 (two) times daily.    08/26/2017  . Calcium Citrate-Vitamin D (CALCIUM CITRATE + D PO) Take 1 capsule by mouth 2 (two) times daily.     08/27/2017 at Unknown time  . clobetasol ointment (TEMOVATE) 3.70 % Apply 1 application topically 2 (two) times daily.   08/27/2017  . DULoxetine (CYMBALTA) 30 MG capsule Take 1 capsule (30 mg total) by mouth daily. 90 capsule 1 08/26/2017 at Unknown time  . gabapentin (NEURONTIN) 300 MG capsule Take 1 capsule (300 mg total) by mouth 3 (three) times daily. 270 capsule 1 08/27/2017 at Unknown time  . glucose blood test strip Test 2 times daily with Accu-Chek monitor 100 each 3 08/20/2017 at Unknown time  . Guselkumab 100 MG/ML SOSY 100 mg. Once every 8 weeks   08/26/2017  . ibuprofen (ADVIL,MOTRIN) 200 MG tablet Take 200 mg by mouth every 6 (six) hours as needed for mild pain.   Past Week at Unknown time  . Insulin Glargine (LANTUS SOLOSTAR) 100 UNIT/ML Solostar Pen Inject 38 Units into the skin daily at 10 pm. 15 mL 4 08/27/2017 at Unknown time  . Insulin Pen Needle 33G X 8 MM MISC 1 application by Does not apply route daily. 100 each 11 08/27/2017 at Unknown time  . metFORMIN (GLUCOPHAGE) 1000 MG tablet Take 1 tablet (1,000 mg total) by mouth 2 (two) times daily with a meal. 180 tablet 3 08/26/2017  . metoprolol tartrate (LOPRESSOR) 25 MG tablet Take 1 tablet (25 mg total) by mouth 2 (two) times daily. 180 tablet 1 08/26/2017  . predniSONE (DELTASONE) 20 MG tablet Take 2 tablets (40mg ) 10/10 at 6pm and 11pm; 10/11 (40mg ) in the morning 6 tablet 0  09/08/2017 at 0630  . simvastatin (ZOCOR) 40 MG tablet TAKE 1 TABLET BY MOUTH AT BEDTIME 90 tablet 1 08/26/2017   Scheduled: . heparin-papaverine-plasmalyte irrigation   Irrigation To OR  . magnesium sulfate  40 mEq Other To OR  . potassium chloride  80 mEq Other To OR  . tranexamic acid  15 mg/kg Intravenous To OR  . tranexamic acid  2 mg/kg Intracatheter To OR   Continuous: . sodium chloride    .  cefUROXime (ZINACEF)  IV    . cefUROXime (ZINACEF)  IV    . dexmedetomidine    . DOPamine    . epinephrine    . heparin 30,000 units/NS 1000 mL solution for CELLSAVER    . insulin (NOVOLIN-R) infusion    . nitroGLYCERIN    . phenylephrine 20mg /274mL NS (0.08mg /ml) infusion    . tranexamic acid (CYKLOKAPRON) infusion ([facility])    . vancomycin     PRN:           Anti-infectives    Start     Dose/Rate Route Frequency Ordered Stop   09/11/2017 1100  vancomycin (VANCOCIN) 1,250 mg in sodium chloride 0.9 % 250 mL IVPB     1,250 mg 166.7 mL/hr over 90 Minutes Intravenous To Surgery 08/27/2017 1056 08/29/17 1100   08/31/2017 1100  cefUROXime (ZINACEF) 1.5 g in dextrose 5 % 50 mL IVPB     1.5 g 100 mL/hr over 30 Minutes Intravenous To Surgery 09/12/2017 1056 08/29/17 1100   09/04/2017 1100  cefUROXime (ZINACEF) 750 mg in dextrose 5 % 50 mL IVPB     750 mg 100 mL/hr over 30 Minutes Intravenous To Surgery 08/21/2017 1056 08/29/17 1100      LabResultsLast48Hours       Results for orders placed or performed during the hospital encounter of 09/01/2017 (from the past 48 hour(s))  Glucose, capillary     Status: Abnormal   Collection Time: 08/27/2017  8:05 AM  Result Value Ref Range   Glucose-Capillary 222 (H) 65 - 99 mg/dL      ImagingResults(Last48hours)  No results found.    Review of Systems  Respiratory: Negative for shortness of breath.   Cardiovascular: Positive for chest pain.  Gastrointestinal: Negative for nausea and vomiting.  Neurological: Negative for dizziness  and loss of consciousness.   Blood pressure 136/66, pulse 85, resp. rate 20, SpO2 92 %. Physical Exam  Constitutional: She is oriented to person, place, and time. She appears well-developed and well-nourished.  HENT:  Head: Normocephalic and atraumatic.  Mouth/Throat: Oropharynx is clear and moist.  Eyes: Pupils are equal, round, and reactive to light. EOM are normal.  Neck: No JVD present.  Cardiovascular: Normal rate, regular rhythm, normal heart sounds and intact distal pulses.   No murmur heard. Respiratory: Effort normal and breath sounds normal. No respiratory distress.  GI: Soft. Bowel sounds are normal. She exhibits no distension. There is no tenderness.  Musculoskeletal: She exhibits no edema.  Neurological: She is alert and oriented to person, place, and time.  Skin: Skin is warm and dry.  Psychiatric: She has a normal mood and affect.    Assessment/Plan:  She has an acute iatrogenic aortic root/ascending aorta dissection with RCA dissection. This will require emergent CABG to the RCA, repair of aortic root and ascending aortic dissection, possible AVR. I discussed the operative procedure with the patient and her husband including alternatives, benefits and risks; including but not limited to bleeding, blood transfusion, infection, stroke, myocardial infarction, graft failure, heart block requiring a permanent pacemaker, organ dysfunction, and death.  Nani Gasser Klausner and her husband understand and agree to proceed. Husband has signed the consent for her.  Gaye Pollack

## 2017-09-02 ENCOUNTER — Encounter (HOSPITAL_COMMUNITY): Admission: EM | Disposition: E | Payer: Self-pay | Source: Other Acute Inpatient Hospital | Attending: Surgery

## 2017-09-02 ENCOUNTER — Encounter (HOSPITAL_COMMUNITY): Payer: Self-pay | Admitting: Cardiovascular Disease

## 2017-09-02 ENCOUNTER — Encounter: Payer: Self-pay | Admitting: Gastroenterology

## 2017-09-02 ENCOUNTER — Other Ambulatory Visit: Payer: Self-pay

## 2017-09-02 ENCOUNTER — Inpatient Hospital Stay (HOSPITAL_COMMUNITY): Payer: Medicare Other

## 2017-09-02 DIAGNOSIS — I469 Cardiac arrest, cause unspecified: Secondary | ICD-10-CM

## 2017-09-02 DIAGNOSIS — J9601 Acute respiratory failure with hypoxia: Secondary | ICD-10-CM

## 2017-09-02 DIAGNOSIS — Z951 Presence of aortocoronary bypass graft: Secondary | ICD-10-CM

## 2017-09-02 DIAGNOSIS — I350 Nonrheumatic aortic (valve) stenosis: Secondary | ICD-10-CM

## 2017-09-02 DIAGNOSIS — I313 Pericardial effusion (noninflammatory): Secondary | ICD-10-CM

## 2017-09-02 DIAGNOSIS — R109 Unspecified abdominal pain: Secondary | ICD-10-CM

## 2017-09-02 HISTORY — PX: LEFT HEART CATH: CATH118248

## 2017-09-02 HISTORY — PX: AORTOGRAM: SHX6300

## 2017-09-02 LAB — COMPREHENSIVE METABOLIC PANEL
ALBUMIN: 2 g/dL — AB (ref 3.5–5.0)
ALK PHOS: 80 U/L (ref 38–126)
ALT: 112 U/L — ABNORMAL HIGH (ref 14–54)
AST: 132 U/L — AB (ref 15–41)
Anion gap: 23 — ABNORMAL HIGH (ref 5–15)
BILIRUBIN TOTAL: 1.2 mg/dL (ref 0.3–1.2)
BUN: 25 mg/dL — AB (ref 6–20)
CALCIUM: 7 mg/dL — AB (ref 8.9–10.3)
CO2: 12 mmol/L — AB (ref 22–32)
Chloride: 108 mmol/L (ref 101–111)
Creatinine, Ser: 1.39 mg/dL — ABNORMAL HIGH (ref 0.44–1.00)
GFR calc Af Amer: 42 mL/min — ABNORMAL LOW (ref >60)
GFR calc non Af Amer: 37 mL/min — ABNORMAL LOW (ref >60)
GLUCOSE: 100 mg/dL — AB (ref 65–99)
POTASSIUM: 3.7 mmol/L (ref 3.5–5.1)
SODIUM: 143 mmol/L (ref 135–145)
TOTAL PROTEIN: 3.5 g/dL — AB (ref 6.5–8.1)

## 2017-09-02 LAB — APTT: aPTT: 56 seconds — ABNORMAL HIGH (ref 24–36)

## 2017-09-02 LAB — LACTATE DEHYDROGENASE: LDH: 435 U/L — ABNORMAL HIGH (ref 98–192)

## 2017-09-02 LAB — PROTIME-INR
INR: 1.57
INR: 2.21
PROTHROMBIN TIME: 24.3 s — AB (ref 11.4–15.2)
Prothrombin Time: 18.6 seconds — ABNORMAL HIGH (ref 11.4–15.2)

## 2017-09-02 LAB — BASIC METABOLIC PANEL
Anion gap: 10 (ref 5–15)
Anion gap: 16 — ABNORMAL HIGH (ref 5–15)
BUN: 27 mg/dL — ABNORMAL HIGH (ref 6–20)
BUN: 26 mg/dL — ABNORMAL HIGH (ref 6–20)
CO2: 23 mmol/L (ref 22–32)
CO2: 27 mmol/L (ref 22–32)
Calcium: 7.3 mg/dL — ABNORMAL LOW (ref 8.9–10.3)
Calcium: 6.4 mg/dL — CL (ref 8.9–10.3)
Chloride: 105 mmol/L (ref 101–111)
Chloride: 101 mmol/L (ref 101–111)
Creatinine, Ser: 1.18 mg/dL — ABNORMAL HIGH (ref 0.44–1.00)
Creatinine, Ser: 1.29 mg/dL — ABNORMAL HIGH (ref 0.44–1.00)
GFR calc Af Amer: 52 mL/min — ABNORMAL LOW (ref >60)
GFR calc Af Amer: 46 mL/min — ABNORMAL LOW (ref >60)
GFR calc non Af Amer: 45 mL/min — ABNORMAL LOW (ref >60)
GFR calc non Af Amer: 40 mL/min — ABNORMAL LOW (ref >60)
Glucose, Bld: 186 mg/dL — ABNORMAL HIGH (ref 65–99)
Glucose, Bld: 167 mg/dL — ABNORMAL HIGH (ref 65–99)
Potassium: 4.6 mmol/L (ref 3.5–5.1)
Potassium: 3.7 mmol/L (ref 3.5–5.1)
Sodium: 138 mmol/L (ref 135–145)
Sodium: 144 mmol/L (ref 135–145)

## 2017-09-02 LAB — LACTIC ACID, PLASMA: Lactic Acid, Venous: 18 mmol/L (ref 0.5–1.9)

## 2017-09-02 LAB — POCT I-STAT, CHEM 8
BUN: 21 mg/dL — AB (ref 6–20)
BUN: 24 mg/dL — ABNORMAL HIGH (ref 6–20)
CALCIUM ION: 0.74 mmol/L — AB (ref 1.15–1.40)
CALCIUM ION: 0.94 mmol/L — AB (ref 1.15–1.40)
CHLORIDE: 104 mmol/L (ref 101–111)
Chloride: 101 mmol/L (ref 101–111)
Creatinine, Ser: 0.8 mg/dL (ref 0.44–1.00)
Creatinine, Ser: 1.1 mg/dL — ABNORMAL HIGH (ref 0.44–1.00)
Glucose, Bld: 118 mg/dL — ABNORMAL HIGH (ref 65–99)
Glucose, Bld: 96 mg/dL (ref 65–99)
HEMATOCRIT: 15 % — AB (ref 36.0–46.0)
HEMATOCRIT: 20 % — AB (ref 36.0–46.0)
HEMOGLOBIN: 5.1 g/dL — AB (ref 12.0–15.0)
Hemoglobin: 6.8 g/dL — CL (ref 12.0–15.0)
POTASSIUM: 3.7 mmol/L (ref 3.5–5.1)
Potassium: 3 mmol/L — ABNORMAL LOW (ref 3.5–5.1)
SODIUM: 151 mmol/L — AB (ref 135–145)
SODIUM: 143 mmol/L (ref 135–145)
TCO2: 30 mmol/L (ref 22–32)
TCO2: 15 mmol/L — ABNORMAL LOW (ref 22–32)

## 2017-09-02 LAB — CBC
HCT: 21.4 % — ABNORMAL LOW (ref 36.0–46.0)
HCT: 22.8 % — ABNORMAL LOW (ref 36.0–46.0)
HCT: 29.2 % — ABNORMAL LOW (ref 36.0–46.0)
HEMATOCRIT: 27.2 % — AB (ref 36.0–46.0)
HEMOGLOBIN: 8.5 g/dL — AB (ref 12.0–15.0)
HEMOGLOBIN: 7.1 g/dL — AB (ref 12.0–15.0)
Hemoglobin: 6.7 g/dL — CL (ref 12.0–15.0)
Hemoglobin: 8.9 g/dL — ABNORMAL LOW (ref 12.0–15.0)
MCH: 32.3 pg (ref 26.0–34.0)
MCH: 32.2 pg (ref 26.0–34.0)
MCH: 31.4 pg (ref 26.0–34.0)
MCH: 29.1 pg (ref 26.0–34.0)
MCHC: 31.3 g/dL (ref 30.0–36.0)
MCHC: 31.3 g/dL (ref 30.0–36.0)
MCHC: 31.1 g/dL (ref 30.0–36.0)
MCHC: 30.5 g/dL (ref 30.0–36.0)
MCV: 103.4 fL — AB (ref 78.0–100.0)
MCV: 102.9 fL — ABNORMAL HIGH (ref 78.0–100.0)
MCV: 100.9 fL — ABNORMAL HIGH (ref 78.0–100.0)
MCV: 95.4 fL (ref 78.0–100.0)
PLATELETS: DECREASED 10*3/uL (ref 150–400)
Platelets: 55 10*3/uL — ABNORMAL LOW (ref 150–400)
Platelets: 42 10*3/uL — ABNORMAL LOW (ref 150–400)
Platelets: 22 10*3/uL — CL (ref 150–400)
RBC: 2.63 MIL/uL — AB (ref 3.87–5.11)
RBC: 2.08 MIL/uL — ABNORMAL LOW (ref 3.87–5.11)
RBC: 2.26 MIL/uL — ABNORMAL LOW (ref 3.87–5.11)
RBC: 3.06 MIL/uL — AB (ref 3.87–5.11)
RDW: 18.1 % — ABNORMAL HIGH (ref 11.5–15.5)
RDW: 18.1 % — ABNORMAL HIGH (ref 11.5–15.5)
RDW: 17.9 % — ABNORMAL HIGH (ref 11.5–15.5)
RDW: 19.9 % — ABNORMAL HIGH (ref 11.5–15.5)
WBC: 17.9 10*3/uL — AB (ref 4.0–10.5)
WBC: 14 10*3/uL — ABNORMAL HIGH (ref 4.0–10.5)
WBC: 11.8 10*3/uL — ABNORMAL HIGH (ref 4.0–10.5)
WBC: 13.1 10*3/uL — AB (ref 4.0–10.5)

## 2017-09-02 LAB — POCT I-STAT 3, ART BLOOD GAS (G3+)
ACID-BASE EXCESS: 1 mmol/L (ref 0.0–2.0)
Acid-base deficit: 7 mmol/L — ABNORMAL HIGH (ref 0.0–2.0)
Acid-base deficit: 5 mmol/L — ABNORMAL HIGH (ref 0.0–2.0)
Acid-base deficit: 23 mmol/L — ABNORMAL HIGH (ref 0.0–2.0)
BICARBONATE: 27.4 mmol/L (ref 20.0–28.0)
BICARBONATE: 21 mmol/L (ref 20.0–28.0)
Bicarbonate: 20.2 mmol/L (ref 20.0–28.0)
Bicarbonate: 8.1 mmol/L — ABNORMAL LOW (ref 20.0–28.0)
O2 SAT: 99 %
O2 SAT: 82 %
O2 Saturation: 89 %
O2 Saturation: 90 %
PCO2 ART: 45.5 mmHg (ref 32.0–48.0)
PCO2 ART: 39.7 mmHg (ref 32.0–48.0)
PCO2 ART: 35.2 mmHg (ref 32.0–48.0)
PH ART: 7.251 — AB (ref 7.350–7.450)
PH ART: 7.329 — AB (ref 7.350–7.450)
PH ART: 6.95 — AB (ref 7.350–7.450)
PO2 ART: 135 mmHg — AB (ref 83.0–108.0)
Patient temperature: 97.6
Patient temperature: 94
Patient temperature: 34
TCO2: 22 mmol/L (ref 22–32)
TCO2: 29 mmol/L (ref 22–32)
TCO2: 22 mmol/L (ref 22–32)
TCO2: 9 mmol/L — ABNORMAL LOW (ref 22–32)
pCO2 arterial: 50.6 mmHg — ABNORMAL HIGH (ref 32.0–48.0)
pH, Arterial: 7.319 — ABNORMAL LOW (ref 7.350–7.450)
pO2, Arterial: 53 mmHg — ABNORMAL LOW (ref 83.0–108.0)
pO2, Arterial: 56 mmHg — ABNORMAL LOW (ref 83.0–108.0)
pO2, Arterial: 61 mmHg — ABNORMAL LOW (ref 83.0–108.0)

## 2017-09-02 LAB — ECHOCARDIOGRAM LIMITED
FS: 19 % — AB (ref 28–44)
Height: 63 in
IVS/LV PW RATIO, ED: 0.97
LA ID, A-P, ES: 19 mm
LA diam end sys: 19 mm
LA diam index: 1 cm/m2
MV pk E vel: 6.2 m/s
PW: 13.7 mm — AB (ref 0.6–1.1)
Weight: 3060.8 oz

## 2017-09-02 LAB — HEMOGLOBIN A1C
HEMOGLOBIN A1C: 5.5 % (ref 4.8–5.6)
MEAN PLASMA GLUCOSE: 111.15 mg/dL

## 2017-09-02 LAB — GLUCOSE, CAPILLARY
Glucose-Capillary: 147 mg/dL — ABNORMAL HIGH (ref 65–99)
Glucose-Capillary: 134 mg/dL — ABNORMAL HIGH (ref 65–99)

## 2017-09-02 LAB — TROPONIN I
TROPONIN I: 19.92 ng/mL — AB (ref <0.03)
Troponin I: 19.9 ng/mL (ref <0.03)
Troponin I: 23.7 ng/mL (ref <0.03)
Troponin I: 21.59 ng/mL (ref <0.03)

## 2017-09-02 LAB — LIPID PANEL
Cholesterol: 34 mg/dL (ref 0–200)
Triglycerides: 169 mg/dL — ABNORMAL HIGH (ref <150)
VLDL: 34 mg/dL (ref 0–40)

## 2017-09-02 LAB — PREPARE RBC (CROSSMATCH)

## 2017-09-02 LAB — HEPARIN INDUCED PLATELET AB (HIT ANTIBODY): HEPARIN INDUCED PLT AB: 0.213 {OD_unit} (ref 0.000–0.400)

## 2017-09-02 LAB — BLOOD PRODUCT ORDER (VERBAL) VERIFICATION

## 2017-09-02 SURGERY — AORTOGRAM

## 2017-09-02 MED ORDER — FENTANYL CITRATE (PF) 100 MCG/2ML IJ SOLN
INTRAMUSCULAR | Status: AC
  Administered 2017-09-02: 25 ug
  Filled 2017-09-02: qty 2

## 2017-09-02 MED ORDER — MIDAZOLAM HCL 2 MG/2ML IJ SOLN: 1.0000 mg | INTRAMUSCULAR | Status: DC | PRN

## 2017-09-02 MED ORDER — HEPARIN (PORCINE) IN NACL 2-0.9 UNIT/ML-% IJ SOLN
INTRAMUSCULAR | Status: AC | PRN
  Administered 2017-09-02: 1000 mL

## 2017-09-02 MED ORDER — METHYLPREDNISOLONE SODIUM SUCC 125 MG IJ SOLR
INTRAMUSCULAR | Status: DC | PRN
  Administered 2017-09-02: 125 mg via INTRAVENOUS

## 2017-09-02 MED ORDER — EPINEPHRINE PF 1 MG/10ML IJ SOSY
PREFILLED_SYRINGE | INTRAMUSCULAR | Status: DC | PRN
  Administered 2017-09-02: 1 mg via INTRAVENOUS

## 2017-09-02 MED ORDER — FENTANYL CITRATE (PF) 100 MCG/2ML IJ SOLN: 50.0000 ug | INTRAMUSCULAR | Status: DC | PRN

## 2017-09-02 MED ORDER — MIDAZOLAM HCL 2 MG/2ML IJ SOLN
INTRAMUSCULAR | Status: AC
  Filled 2017-09-02: qty 2

## 2017-09-02 MED ORDER — EPINEPHRINE PF 1 MG/ML IJ SOLN
0.5000 ug/min | INTRAMUSCULAR | Status: DC
  Administered 2017-09-02: 10 ug/min via INTRAVENOUS
  Filled 2017-09-02 (×3): qty 4

## 2017-09-02 MED ORDER — EPINEPHRINE PF 1 MG/10ML IJ SOSY
PREFILLED_SYRINGE | INTRAMUSCULAR | Status: AC
  Filled 2017-09-02: qty 10

## 2017-09-02 MED ORDER — SODIUM CHLORIDE 0.9 % IV SOLN: 250.0000 mL | INTRAVENOUS | Status: DC | PRN

## 2017-09-02 MED ORDER — FENTANYL BOLUS VIA INFUSION
25.0000 ug | INTRAVENOUS | Status: DC | PRN
  Administered 2017-09-02: 25 ug via INTRAVENOUS
  Filled 2017-09-02: qty 25

## 2017-09-02 MED ORDER — PANTOPRAZOLE SODIUM 40 MG IV SOLR: 40.0000 mg | Freq: Every day | INTRAVENOUS | Status: DC

## 2017-09-02 MED ORDER — SODIUM CHLORIDE 0.9 % IV SOLN
Freq: Once | INTRAVENOUS | Status: DC
  Administered 2017-09-02: 09:00:00 via INTRAVENOUS

## 2017-09-02 MED ORDER — INSULIN ASPART 100 UNIT/ML ~~LOC~~ SOLN: 0.0000 [IU] | SUBCUTANEOUS | Status: DC

## 2017-09-02 MED ORDER — SODIUM CHLORIDE 0.9 % IV SOLN: Freq: Once | INTRAVENOUS | Status: DC

## 2017-09-02 MED ORDER — DOPAMINE-DEXTROSE 3.2-5 MG/ML-% IV SOLN
INTRAVENOUS | Status: AC
  Administered 2017-09-02: 25 ug/kg/min
  Administered 2017-09-02: 5 ug/kg/min
  Filled 2017-09-02: qty 250

## 2017-09-02 MED ORDER — SODIUM BICARBONATE 8.4 % IV SOLN
INTRAVENOUS | Status: AC
  Filled 2017-09-02: qty 50

## 2017-09-02 MED ORDER — IPRATROPIUM-ALBUTEROL 0.5-2.5 (3) MG/3ML IN SOLN
RESPIRATORY_TRACT | Status: AC
  Filled 2017-09-02: qty 3

## 2017-09-02 MED ORDER — MIDAZOLAM HCL 2 MG/2ML IJ SOLN
INTRAMUSCULAR | Status: DC | PRN
Start: 2017-09-02 — End: 2017-09-02
  Administered 2017-09-02: 2 mg via INTRAVENOUS

## 2017-09-02 MED ORDER — SODIUM CHLORIDE 0.9 % IV SOLN: INTRAVENOUS | Status: DC | PRN

## 2017-09-02 MED ORDER — SODIUM CHLORIDE 0.9% FLUSH: 3.0000 mL | INTRAVENOUS | Status: DC | PRN

## 2017-09-02 MED ORDER — ACETAMINOPHEN 325 MG PO TABS: 650.0000 mg | ORAL_TABLET | ORAL | Status: DC | PRN

## 2017-09-02 MED ORDER — DEXTROSE 5 % IV SOLN
0.0000 ug/min | INTRAVENOUS | Status: DC
  Administered 2017-09-02: 20 ug/min via INTRAVENOUS
  Filled 2017-09-02 (×3): qty 16

## 2017-09-02 MED ORDER — SODIUM BICARBONATE 8.4 % IV SOLN
INTRAVENOUS | Status: AC
  Administered 2017-09-02: 200 meq via INTRAVENOUS
  Filled 2017-09-02: qty 150

## 2017-09-02 MED ORDER — CALCIUM CHLORIDE 10 % IV SOLN: 1.0000 g | Freq: Once | INTRAVENOUS | Status: DC

## 2017-09-02 MED ORDER — MIDAZOLAM HCL 2 MG/2ML IJ SOLN
INTRAMUSCULAR | Status: AC
  Administered 2017-09-02: 2 mg
  Filled 2017-09-02: qty 2

## 2017-09-02 MED ORDER — FENTANYL 2500MCG IN NS 250ML (10MCG/ML) PREMIX INFUSION
25.0000 ug/h | INTRAVENOUS | Status: DC
  Administered 2017-09-02: 50 ug/h via INTRAVENOUS
  Filled 2017-09-02: qty 250

## 2017-09-02 MED ORDER — SODIUM CHLORIDE 0.9% FLUSH: 3.0000 mL | Freq: Two times a day (BID) | INTRAVENOUS | Status: DC

## 2017-09-02 MED ORDER — SODIUM CHLORIDE 0.9 % IV SOLN: INTRAVENOUS | Status: DC

## 2017-09-02 MED ORDER — LIDOCAINE HCL (PF) 1 % IJ SOLN
INTRAMUSCULAR | Status: DC | PRN
  Administered 2017-09-02: 20 mL

## 2017-09-02 MED ORDER — ALBUMIN HUMAN 5 % IV SOLN
INTRAVENOUS | Status: AC
  Filled 2017-09-02: qty 500

## 2017-09-02 MED ORDER — FENTANYL CITRATE (PF) 100 MCG/2ML IJ SOLN
50.0000 ug | Freq: Once | INTRAMUSCULAR | Status: AC
  Administered 2017-09-02: 50 ug via INTRAVENOUS

## 2017-09-02 MED ORDER — ALBUMIN HUMAN 5 % IV SOLN
INTRAVENOUS | Status: AC
  Administered 2017-09-02: 05:00:00
  Filled 2017-09-02: qty 250

## 2017-09-02 MED ORDER — IOPAMIDOL (ISOVUE-370) INJECTION 76%
INTRAVENOUS | Status: DC | PRN
  Administered 2017-09-02: 30 mL via INTRA_ARTERIAL

## 2017-09-02 MED ORDER — VASOPRESSIN 20 UNIT/ML IV SOLN
0.0300 [IU]/min | INTRAVENOUS | Status: DC
  Filled 2017-09-02: qty 2

## 2017-09-02 MED ORDER — SODIUM BICARBONATE 8.4 % IV SOLN
200.0000 meq | Freq: Once | INTRAVENOUS | Status: AC
  Administered 2017-09-02: 200 meq via INTRAVENOUS

## 2017-09-02 MED ORDER — SODIUM CHLORIDE 0.9 % WEIGHT BASED INFUSION: 1.0000 mL/kg/h | INTRAVENOUS | Status: DC

## 2017-09-02 MED ORDER — DOPAMINE-DEXTROSE 3.2-5 MG/ML-% IV SOLN
0.0000 ug/kg/min | INTRAVENOUS | Status: DC
  Administered 2017-09-02: 25 ug/kg/min via INTRAVENOUS
  Filled 2017-09-02: qty 250

## 2017-09-02 MED FILL — Potassium Chloride Inj 2 mEq/ML: INTRAVENOUS | Qty: 40 | Status: AC

## 2017-09-02 MED FILL — Magnesium Sulfate Inj 50%: INTRAMUSCULAR | Qty: 10 | Status: AC

## 2017-09-02 MED FILL — Heparin Sodium (Porcine) Inj 1000 Unit/ML: INTRAMUSCULAR | Qty: 30 | Status: AC

## 2017-09-02 SURGICAL SUPPLY — 17 items
BALLN IABP SENSA PLUS 7.5F 40C (BALLOONS) ×3
BALLOON IABP SENS PLUS 7.5F40C (BALLOONS) ×2 IMPLANT
CATH INFINITI 5 FR RCB (CATHETERS) ×3 IMPLANT
CATH INFINITI 5FR ANG PIGTAIL (CATHETERS) ×3 IMPLANT
CATH INFINITI JR4 5F (CATHETERS) ×3 IMPLANT
COVER PRB 48X5XTLSCP FOLD TPE (BAG) ×2 IMPLANT
COVER PROBE 5X48 (BAG) ×1
DEVICE SECURE STATLOCK IABP (MISCELLANEOUS) ×6 IMPLANT
HOVERMATT SINGLE USE (MISCELLANEOUS) ×3 IMPLANT
KIT ESSENTIALS PG (KITS) ×3 IMPLANT
KIT HEART LEFT (KITS) ×3 IMPLANT
PACK CARDIAC CATHETERIZATION (CUSTOM PROCEDURE TRAY) ×3 IMPLANT
PAD ELECT DEFIB RADIOL ZOLL (MISCELLANEOUS) ×3 IMPLANT
PROTECTION STATION PRESSURIZED (MISCELLANEOUS) ×3
SHEATH PINNACLE 6F 10CM (SHEATH) ×3 IMPLANT
STATION PROTECTION PRESSURIZED (MISCELLANEOUS) ×2 IMPLANT
TRANSDUCER W/STOPCOCK (MISCELLANEOUS) ×3 IMPLANT

## 2017-09-03 LAB — BPAM RBC
BLOOD PRODUCT EXPIRATION DATE: 201810202359
BLOOD PRODUCT EXPIRATION DATE: 201811092359
BLOOD PRODUCT EXPIRATION DATE: 201811092359
BLOOD PRODUCT EXPIRATION DATE: 201811152359
Blood Product Expiration Date: 201810182359
Blood Product Expiration Date: 201811092359
Blood Product Expiration Date: 201811092359
Blood Product Expiration Date: 201811092359
Blood Product Expiration Date: 201811152359
ISSUE DATE / TIME: 201810160539
ISSUE DATE / TIME: 201810160736
ISSUE DATE / TIME: 201810160911
ISSUE DATE / TIME: 201810160539
ISSUE DATE / TIME: 201810160651
ISSUE DATE / TIME: 201810160736
ISSUE DATE / TIME: 201810161028
UNIT TYPE AND RH: 5100
UNIT TYPE AND RH: 5100
UNIT TYPE AND RH: 5100
Unit Type and Rh: 5100
Unit Type and Rh: 5100
Unit Type and Rh: 5100
Unit Type and Rh: 5100
Unit Type and Rh: 9500
Unit Type and Rh: 9500

## 2017-09-03 LAB — TYPE AND SCREEN
ABO/RH(D): O POS
ANTIBODY SCREEN: NEGATIVE
UNIT DIVISION: 0
UNIT DIVISION: 0
UNIT DIVISION: 0
UNIT DIVISION: 0
Unit division: 0
Unit division: 0
Unit division: 0
Unit division: 0
Unit division: 0

## 2017-09-03 LAB — BPAM PLATELET PHERESIS
Blood Product Expiration Date: 201810162359
ISSUE DATE / TIME: 201810160846
Unit Type and Rh: 6200

## 2017-09-03 LAB — PREPARE PLATELET PHERESIS: UNIT DIVISION: 0

## 2017-09-03 MED FILL — Medication: Qty: 1 | Status: AC

## 2017-09-18 NOTE — Procedures (Signed)
Endotracheal Intubation Procedure Note  Indication for endotracheal intubation: respiratory failure. Airway Assessment: Mallampati Class: I (soft palate, uvula, fauces, and tonsillar pillars visible). Sedation: none. Paralytic: none. Lidocaine: no. Atropine: no. Equipment: Macintosh 3 laryngoscope blade and 7.40mm cuffed endotracheal tube. Cricoid Pressure: no. Number of attempts: 1. ETT location confirmed by by auscultation, by CXR and ETCO2 monitor.  Rise Paganini Lesley Galentine 09-17-17

## 2017-09-18 NOTE — Consult Note (Signed)
PULMONARY / CRITICAL CARE MEDICINE   Name: Caroline Gray MRN: [id number] DOB: 03/05/1946    ADMISSION DATE:  09/07/2017 CONSULTATION DATE:  09-24-2017  REFERRING MD:  Cyndia Bent  CHIEF COMPLAINT:  Hypoxia  HISTORY OF PRESENT ILLNESS:  Pt is encephelopathic; therefore, this HPI is obtained from chart review. Caroline Gray is a 71 y.o. female with PMH as outlined below. She had elective cath 08/65 at [facility] complicated by RCA dissection and aortic root dissection.  She was emergently transferred to [facility] for CVTS consultation.  She was taken to the OR and had repair of RCA dissection via CABG x 1 (SGV to RCA).  During early AM hours 10/16, she complained of abdominal pain.  Got up to use bedside commode and had very small BM.  Had near syncopal episode and was helped back to bed.  After return to bed, became hypoxic and hypotensive despite neosynephrine.  Neo was increased and dopamine was started.  PCCM called in consultation urgently.  At the time of our arrival to bedside, pt has eyes open with blank gaze.  Skin very pale and dusky.  Pt non-responsive.  Shortly thereafter, she developed PEA arrest.  ACLS was started and pt received a total of 3mg  epi, 3 amps HCO3.  She was intubated (vent settings: FiO2 100 / PEEP 5 / Rate 20 / VT 420).  She was also started on epinephrine gtt with improvement in BP.  CXR demonstrated R > L edema with small effusion.  ABG 7.25 / 45 / 135.   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  PAST SURGICAL HISTORY: She  has a past surgical history that includes Lithotripsy (3.7.13); Rotator cuff repair (Right, 08/2005); Spine surgery; Abdominal hysterectomy; Cholecystectomy; Appendectomy;  Cataract extraction; Abdominal hysterectomy; Eye surgery (Bilateral); Ureteroscopy with holmium laser lithotripsy (Bilateral, 01/22/2017); Cystoscopy with stent placement (Bilateral, 01/22/2017); Breast biopsy (Right, 1990); LEFT HEART CATH AND CORONARY ANGIOGRAPHY (Left, 09/01/2017); INTRAVASCULAR PRESSURE WIRE/FFR STUDY (N/A, 08/19/2017); Bentall procedure (N/A, 08/26/2017); Coronary artery bypass graft (N/A, 08/26/2017); and Intraoprative transesophageal echocardiogram (N/A, 08/24/2017).  Allergies  Allergen Reactions  . Influenza Vaccines Other (See Comments)    Has/had "Guillain-Barr Syndrome" and cannot have ANY VACCINATIONS!!  . Pneumococcal Vaccines     Has/had "Guillain-Barr Syndrome" and cannot have ANY VACCINATIONS!!  . Codeine Nausea And Vomiting    Nausea and vomiting  . Macrodantin Nausea And Vomiting  . Ioxaglate Palpitations and Other (See Comments)    Causes shaking and rapid heartbeats  . Ivp Dye [Iodinated Diagnostic Agents] Palpitations and Other (See Comments)    Causes shaking and rapid heartbeats    No current facility-administered medications on file prior to encounter.    Current Outpatient Prescriptions on File Prior to Encounter  Medication Sig  . aspirin 81 MG tablet Take 1 tablet (81 mg total) by mouth daily.  . calcipotriene (DOVONOX) 0.005 % cream Apply 1 application topically 2 (two) times daily.   . Calcium Citrate-Vitamin D (CALCIUM CITRATE + D PO) Take 1 capsule by mouth 2 (two) times daily.    . clobetasol ointment (TEMOVATE) 7.84 % Apply 1 application topically 2 (two) times daily.  . DULoxetine (CYMBALTA) 30 MG capsule Take 1 capsule (30 mg total) by mouth daily.  Marland Kitchen gabapentin (NEURONTIN) 300 MG capsule  Take 1 capsule (300 mg total) by mouth 3 (three) times daily.  . Guselkumab 100 MG/ML SOSY Inject 100 mg into the skin every 8 (eight) weeks.   Marland Kitchen ibuprofen (ADVIL,MOTRIN) 200 MG tablet Take 400 mg by mouth every 6 (six) hours as needed for mild pain.    . Insulin Glargine (LANTUS SOLOSTAR) 100 UNIT/ML Solostar Pen Inject 38 Units into the skin daily at 10 pm.  . metFORMIN (GLUCOPHAGE) 1000 MG tablet Take 1 tablet (1,000 mg total) by mouth 2 (two) times daily with a meal.  . metoprolol tartrate (LOPRESSOR) 25 MG tablet Take 1 tablet (25 mg total) by mouth 2 (two) times daily.  . predniSONE (DELTASONE) 20 MG tablet Take 2 tablets (40mg ) 10/10 at 6pm and 11pm; 10/11 (40mg ) in the morning (Patient taking differently: Take 40 mg by mouth See admin instructions. 40 mg by mouth at 1800 (6 pm) and 2300 (11 pm) on 08/27/17 and 40 mg on the morning of 09/01/2017)  . simvastatin (ZOCOR) 40 MG tablet TAKE 1 TABLET BY MOUTH AT BEDTIME (Patient taking differently: Take 40 mg by mouth at bedtime)  . glucose blood test strip Test 2 times daily with Accu-Chek monitor  . Insulin Pen Needle 33G X 8 MM MISC 1 application by Does not apply route daily.    FAMILY HISTORY:  Her indicated that her mother is deceased. She indicated that her father is deceased. She indicated that the status of her son is unknown. She indicated that all of her four paternal aunts are deceased. She indicated that all of her four paternal uncles are deceased. She indicated that the status of her neg hx is unknown.    SOCIAL HISTORY: She  reports that she quit smoking about 17 years ago. Her smoking use included Cigarettes. She quit after 15.00 years of use. She has never used smokeless tobacco. She reports that she drinks alcohol. She reports that she does not use drugs.  REVIEW OF SYSTEMS:  Unable to obtain as pt is encephalopathic.  SUBJECTIVE:  Non-responsive.  VITAL SIGNS: BP 91/78   Pulse (!) 114   Temp 97.7 F (36.5 C) (Oral)   Resp (!) 22   Ht 5\' 3"  (1.6 m)   Wt 86.8 kg (191 lb 4.8 oz)   SpO2 (!) 86%   BMI 33.89 kg/m   HEMODYNAMICS:    VENTILATOR SETTINGS:    INTAKE / OUTPUT: I/O last 3 completed shifts: In: 1866.8 [P.O.:1080; I.V.:786.8] Out: 2120  [Urine:2120]   PHYSICAL EXAMINATION: General: Adult female, critically ill appearing. Neuro: Non-responsive (just intubated after code) HEENT: [place]/AT. PERRL, sclerae anicteric. Cardiovascular: RRR, no M/R/G.  Lungs: Respirations shallow.  Minimal air entry. Abdomen: BS x 4, soft, NT/ND.  Musculoskeletal: No gross deformities, no edema.  Skin: Pale, ashen.  LABS:  BMET  Recent Labs Lab 08/30/17 0400 08/31/17 0340 09/01/17 0340  NA 136 137 140  K 3.9 4.2 3.9  CL 108 108 108  CO2 22 24 26   BUN 19 22* 21*  CREATININE 1.14* 1.02* 0.76  GLUCOSE 92 146* 102*    Electrolytes  Recent Labs Lab 08/20/2017 2119 08/29/17 0319 08/29/17 1518 08/30/17 0400 08/31/17 0340 09/01/17 0340  CALCIUM 7.3* 7.3*  --  7.2* 7.2* 7.2*  MG 2.9* 2.8* 2.3  --   --   --     CBC  Recent Labs Lab 08/30/17 0400 08/31/17 0340 09/01/17 0340  WBC 21.3* 14.6* 13.3*  HGB 9.6* 9.0* 8.9*  HCT 29.3* 27.9* 27.8*  PLT  51* 40* 40*    Coag's  Recent Labs Lab 08/20/2017 1721  APTT 47*  INR 1.66    Sepsis Markers No results for input(s): LATICACIDVEN, PROCALCITON, O2SATVEN in the last 168 hours.  ABG  Recent Labs Lab 08/29/17 0635 08/29/17 0759 09/06/2017 0419  PHART 7.358 7.325* 7.251*  PCO2ART 48.8* 46.1 45.5  PO2ART 85.0 78.0* 135.0*    Liver Enzymes No results for input(s): AST, ALT, ALKPHOS, BILITOT, ALBUMIN in the last 168 hours.  Cardiac Enzymes No results for input(s): TROPONINI, PROBNP in the last 168 hours.  Glucose  Recent Labs Lab 08/31/17 2107 09/01/17 0739 09/01/17 1245 09/01/17 1732 09/01/17 2007 09/01/17 2155  GLUCAP 124* 109* 158* 139* 126* 127*    Imaging Dg Abd 1 View  Result Date: Sep 06, 2017 CLINICAL DATA:  Five days postop CABG. Patient woke up with abdominal pain. EXAM: ABDOMEN - 1 VIEW COMPARISON:  CT abdomen and pelvis 01/13/2017 FINDINGS: Calcified granulomas in the left upper quadrant, likely in the spleen. No radiopaque stones. Degenerative  changes in the lumbar spine and hips. Scattered gas and stool in the colon. No small or large bowel distention. Surgical clips in the right upper quadrant. IMPRESSION: Nonobstructive bowel gas pattern. Electronically Signed   By: Lucienne Capers M.D.   On: 2017-09-06 04:31   Dg Chest Port 1 View  Result Date: 2017-09-06 CLINICAL DATA:  Five days postoperative CABG. Change in behavior. Abdominal pain and shortness of breath. EXAM: PORTABLE CHEST 1 VIEW COMPARISON:  09/01/2017 FINDINGS: Postoperative changes in the mediastinum. Right central venous catheter with tip over the mid SVC region. No pneumothorax. Cardiac enlargement with mild vascular congestion. Small bilateral pleural effusions. Atelectasis or infiltration in the lung bases. Changes are more prominent on the right. Similar appearance to previous study allowing for differences in technique. Surgical clips in the right axilla. Degenerative changes in the spine and shoulders. Calcification of the aorta. IMPRESSION: Cardiac enlargement. Small bilateral pleural effusions with basilar atelectasis, greater on the right. No significant change since previous study. Electronically Signed   By: Lucienne Capers M.D.   On: 09/06/2017 04:30   Dg Chest Port 1 View  Result Date: 09/01/2017 CLINICAL DATA:  Status post CABG 4 days ago EXAM: PORTABLE CHEST 1 VIEW COMPARISON:  Portable chest x-ray of August 31, 2017 FINDINGS: The lungs are adequately inflated. The interstitial markings remain increased but have improved slightly. Bibasilar atelectasis and small bilateral pleural effusions persist. The cardiac silhouette remains enlarged. The pulmonary vascularity is less engorged and more distinct. The sternal wires are intact. The right internal jugular Cordis sheath tip projects over the proximal SVC. There is calcification in the wall of the aortic arch. IMPRESSION: Slight interval improvement in pulmonary interstitial edema. Persistent small bilateral  pleural effusions and bibasilar atelectasis. Electronically Signed   By: David  Martinique M.D.   On: 09/01/2017 07:32     STUDIES:  CXR 10/16 > small bilateral effusions, R > L edema. AXR 10/16 > non obstructive gas pattern.  Echo 10/15 > EF 55-60%. CTA chest / abd / pelv 10/16 >   CULTURES: None.  ANTIBIOTICS: None.  SIGNIFICANT EVENTS: 10/11 > admit. 10/16 > PEA arrest.  LINES/TUBES: ETT 10/16 >  R IJ 10/11 >   DISCUSSION: 71 y.o. female admitted 10/11 for emergent cardiac surgery due to RCA and aortic root dissection.  Taken to OR for repair and tolerated well.  10/16, had hypoxia and worsening hypotension followed by cardiac arrest.  ASSESSMENT / PLAN:  PULMONARY A:  Respiratory insufficiency - due to cardiac arrest. Mild acute pulmonary edema. Small bilateral effusions R > L. P:   Full vent support. Wean as able. VAP prevention measures. SBT in AM if able. CXR in AM.  CARDIOVASCULAR A:  Shock - presumed combo cardiogenic + hemorrhagic. 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 A.fib. Hx HTN, HLD. P:  Continue neosynephrine gtt, dopamine gtt per CVTS. Would add epinephrine gtt now given persistent hypotension. Continue PRBC transfusion. Trend troponins, lactate. CVTS and cardiology following. Continue amiodarone, simvastatin. Hold preadmission lopressor.  RENAL A:   AKI. P:   NS @ 75. BMP in AM.  GASTROINTESTINAL A:   GI prophylaxis. Nutrition. Hx IBS, diverticulosis. P:   SUP: Pantoprazole. NPO.  HEMATOLOGIC A:   Acute blood loss anemia - unclear etiology at this point, presumed intrabdominal source as CXR not that impressive. Thrombocytopenia - chronic. VTE Prophylaxis. P:  Transfuse 1u PRBC now. Maintain Hgb > 8 given cardiac history. Needs CTA chest/abdomen / pelvis once stable. SCD's only. CBC in AM.  INFECTIOUS A:   No indication of infection. P:   Monitor clinically.  ENDOCRINE A:   Hx DM.   P:   SSI. HOld preadmission lantus, metformin.  NEUROLOGIC A:   Acute encephalopathy. Hx Guillain barre, anxiety. P:   Sedation:  Fentanyl PRN / Midazolam PRN. RASS goal: 0 to -1. Daily WUA. Hold preadmission duloxetine, gabapentin.  Family updated: Family updated by nursing staff during code.  Interdisciplinary Family Meeting v Palliative Care Meeting:  Due by: 09/08/17.  CC time: 45 min.   Montey Hora, [place] [facility] Pager: [phone]  or [phone] 09/11/17, 5:07 AM

## 2017-09-18 NOTE — Progress Notes (Signed)
2 units of blood given emergently. Checked off by 2 RNs. Blood consent in chart.   Unit [id number] F  Unit [id number] Y   Vital signs measured frequently during transfusion. Richarda Blade RN

## 2017-09-18 NOTE — Progress Notes (Signed)
      [address]       [place] [address]             [phone]      Events noted  At bedside with Dr. Titus Mould  Abrupt hemodynamic collapse this morning. Taken to cath lab. Graft OK, no evidence of aortic dissection. Good LV function, mild RV dysfunction on echo.  She is hypotensive on multiple pressors including levophed, epinephrine, and neosynephrine, even after 4 units of PRBC. Most recent ABG showed profound metabolic acidosis with pH 6.9 and base deficit of -23. Skin mottled. PT has gone from  Most likely scenario is an intraabdominal catastrophe, i.e. Intestinal infarction. Also has signs of liver failure with PT going from 18.6 to 24.3.   HIT Ab was negative  Prognosis is grim. Will continue with resuscitation.  Revonda Standard Roxan Hockey, MD [facility] [phone]

## 2017-09-18 NOTE — Progress Notes (Signed)
  Echocardiogram 2D Echocardiogram limited has been performed.  Caroline Gray September 28, 2017, 5:52 AM

## 2017-09-18 NOTE — Progress Notes (Signed)
Patient time of death occurred at 1236-02-16.  No heart tones or respiratory effort auscultated for 2 minutes by 2 RNs. No reflexes noted.  Family at bedside.  Emotional support given.

## 2017-09-18 NOTE — Progress Notes (Signed)
200 cc fentanyl drip wasted in sink followed by water flush by Verlin Grills and Carleene Cooper RNs

## 2017-09-18 NOTE — Progress Notes (Signed)
CRITICAL VALUE ALERT  Critical Value:   Hgb 6.7  Troponin 19.9  Calcium 6.4  Date & Time Notied:  Sep 23, 2017  Provider Notified: Dr. Servando Snare at bedside  Orders Received/Actions taken: Orders received for 2 units PRBC, 2 amp calcium chloride, and to have cardiology come to beside and do a STAT echo.   Richarda Blade RN

## 2017-09-18 NOTE — Progress Notes (Addendum)
Patient ID: Caroline Gray, female   DOB: 07-Sep-1946, 71 y.o.   MRN: [id number] TCTS DAILY ICU PROGRESS NOTE                   [address]            [place] [address]          [phone]   5 Days Post-Op Procedure(s) (LRB): REPAIR OF RIGHT CORONARY ARTERY DISSECTION AND REPAIR OF AORTIC ROOT DISSECTION (N/A) CORONARY ARTERY BYPASS GRAFTING (CABG) x 1, using left greater saphenous vein harvested endoscopically (N/A) INTRAOPERATIVE TRANSESOPHAGEAL ECHOCARDIOGRAM (N/A)  Total Length of Stay:  LOS: 5 days   Subjective: Patient woke about 4 am, sob complaining of impending doom and abdominal pain, declined requiring intubation and epi bolus , now on levophed , epi dopamine , sinus rhythym  Objective: Vital signs in last 24 hours: Temp:  [97.6 F (36.4 C)-98 F (36.7 C)] 97.7 F (36.5 C) (10/15 2345) Pulse Rate:  [81-139] 89 (10/16 0450) Cardiac Rhythm: Atrial paced (10/16 0300) Resp:  [14-29] 20 (10/16 0515) BP: (60-116)/(30-98) 60/30 (10/16 0515) SpO2:  [86 %-100 %] 98 % (10/16 0450) Arterial Line BP: (58-137)/(28-50) 58/28 (10/16 0515) FiO2 (%):  [100 %] 100 % (10/16 0450)  Filed Weights   08/30/17 0530 08/31/17 0615 09/01/17 0500  Weight: 195 lb 15.8 oz (88.9 kg) 192 lb 10.9 oz (87.4 kg) 191 lb 4.8 oz (86.8 kg)    Weight change:    Hemodynamic parameters for last 24 hours:    Intake/Output from previous day: 10/15 0701 - 10/16 0700 In: 617.3 [I.V.:617.3] Out: 885 [Urine:885]  Intake/Output this shift: Total I/O In: 347 [I.V.:347] Out: 285 [Urine:285]  Current Meds: Scheduled Meds: . acetaminophen  1,000 mg Oral Q6H   Or  . acetaminophen (TYLENOL) oral liquid 160 mg/5 mL  1,000 mg Per Tube Q6H  . aspirin EC  325 mg Oral Daily   Or  . aspirin  324 mg Per Tube Daily  . bisacodyl  10 mg Oral Daily   Or  . bisacodyl  10 mg Rectal Daily  . docusate sodium  200 mg Oral Daily  . EPINEPHrine      . fentaNYL      . furosemide  20 mg Intravenous BID   . insulin aspart  0-15 Units Subcutaneous Q4H  . ipratropium-albuterol      . mouth rinse  15 mL Mouth Rinse BID  . midazolam      . pantoprazole (PROTONIX) IV  40 mg Intravenous Daily  . simvastatin  40 mg Oral QHS  . sodium bicarbonate      . sodium bicarbonate      . sodium chloride flush  3 mL Intravenous Q12H   Continuous Infusions: . sodium chloride Stopped (08/30/17 1900)  . sodium chloride    . sodium chloride Stopped (08/30/17 1700)  . sodium chloride    . sodium chloride    . albumin human    . amiodarone 30 mg/hr (09/11/17 0300)  . DOPamine    . epinephrine 10 mcg/min (09-11-17 0518)  . lactated ringers Stopped (08/30/17 1900)  . lactated ringers 10 mL/hr at 09/01/17 1300  . nitroGLYCERIN Stopped (08/31/2017 1845)  . norepinephrine (LEVOPHED) Adult infusion 20 mcg/min (11-Sep-2017 0516)  . phenylephrine (NEO-SYNEPHRINE) Adult infusion 40 mcg/min (September 11, 2017 0300)   PRN Meds:.sodium chloride, Place/Maintain arterial line **AND** sodium chloride, fentaNYL (SUBLIMAZE) injection, fentaNYL (SUBLIMAZE) injection, metoprolol tartrate, midazolam, midazolam, ondansetron (ZOFRAN) IV, sodium  chloride flush  General appearance: marginally responsive  Neurologic: moves all extremities   Heart: regular rate and rhythm, S1, S2 normal, no murmur, click, rub or gallop Lungs: diminished breath sounds bibasilar Abdomen: difficulty to examine , but painful grimace on exam Extremities: edema edema in hans and legs , bruising right leg at vein harvest site  Wound: cpr done briefly but sternum stable   Lab Results: CBC:  Recent Labs  09/01/17 0340 2017/09/18 0401 09-18-2017 0443  WBC 13.3* 17.9*  --   HGB 8.9* 8.5* 5.1*  HCT 27.8* 27.2* 15.0*  PLT 40* 55*  --    BMET:   Recent Labs  09/01/17 0340 Sep 18, 2017 0401 2017/09/18 0443  NA 140 138 151*  K 3.9 4.6 3.0*  CL 108 105 101  CO2 26 23  --   GLUCOSE 102* 186* 118*  BUN 21* 27* 21*  CREATININE 0.76 1.18* 0.80  CALCIUM 7.2* 7.3*   --     CMET: Lab Results  Component Value Date   WBC 17.9 (H) Sep 18, 2017   HGB 5.1 (LL) 09-18-2017   HCT 15.0 (L) 09/18/17   PLT 55 (L) 09/18/2017   GLUCOSE 118 (H) Sep 18, 2017   CHOL 137 07/06/2015   TRIG 142.0 07/06/2015   HDL 33.30 (L) 07/06/2015   LDLDIRECT 74.7 11/05/2013   LDLCALC 75 07/06/2015   ALT 26 07/15/2017   AST 40 (H) 07/15/2017   NA 151 (H) September 18, 2017   K 3.0 (L) 09-18-17   CL 101 Sep 18, 2017   CREATININE 0.80 September 18, 2017   BUN 21 (H) 09-18-17   CO2 23 09-18-17   INR 1.66 09/12/2017   HGBA1C 6.7 (H) 07/15/2017   MICROALBUR 4.7 (H) 07/15/2017      PT/INR: No results for input(s): LABPROT, INR in the last 72 hours. Radiology: Dg Abd 1 View  Result Date: 09/18/2017 CLINICAL DATA:  Five days postop CABG. Patient woke up with abdominal pain. EXAM: ABDOMEN - 1 VIEW COMPARISON:  CT abdomen and pelvis 01/13/2017 FINDINGS: Calcified granulomas in the left upper quadrant, likely in the spleen. No radiopaque stones. Degenerative changes in the lumbar spine and hips. Scattered gas and stool in the colon. No small or large bowel distention. Surgical clips in the right upper quadrant. IMPRESSION: Nonobstructive bowel gas pattern. Electronically Signed   By: Lucienne Capers M.D.   On: Sep 18, 2017 04:31   Dg Chest Port 1 View  Result Date: 09-18-17 CLINICAL DATA:  Initial evaluation for endotracheal tube placement. EXAM: PORTABLE CHEST 1 VIEW COMPARISON:  Prior radiograph from earlier same day. FINDINGS: Patient now intubated with the tip of an endotracheal tube positioned approximately 2.2 cm above the carina. Large bore right IJ approach centra venous catheter overlies the proximal SVC. Defibrillator pads overlie the chest. Median sternotomy wires underlying CABG markers. Stable cardiomegaly. Mediastinal silhouette normal. Persistent bilateral pleural effusions. Bibasilar opacities likely effusion and/ or atelectasis. Underlying pulmonary edema. Appearance overall  similar to previous. No pneumothorax. IMPRESSION: 1. Tip of the endotracheal tube positioned 2.2 cm above the carina. 2. Stable cardiomegaly with diffuse pulmonary edema and bilateral pleural effusions. Bibasilar opacities likely reflect atelectasis. Appearance is overall similar to previous. Electronically Signed   By: Jeannine Boga M.D.   On: 2017/09/18 05:23   Dg Chest Port 1 View  Result Date: 18-Sep-2017 CLINICAL DATA:  Five days postoperative CABG. Change in behavior. Abdominal pain and shortness of breath. EXAM: PORTABLE CHEST 1 VIEW COMPARISON:  09/01/2017 FINDINGS: Postoperative changes in the mediastinum. Right central venous catheter with  tip over the mid SVC region. No pneumothorax. Cardiac enlargement with mild vascular congestion. Small bilateral pleural effusions. Atelectasis or infiltration in the lung bases. Changes are more prominent on the right. Similar appearance to previous study allowing for differences in technique. Surgical clips in the right axilla. Degenerative changes in the spine and shoulders. Calcification of the aorta. IMPRESSION: Cardiac enlargement. Small bilateral pleural effusions with basilar atelectasis, greater on the right. No significant change since previous study. Electronically Signed   By: Lucienne Capers M.D.   On: 2017-09-10 04:30   Dg Chest Port 1 View  Result Date: 09/01/2017 CLINICAL DATA:  Status post CABG 4 days ago EXAM: PORTABLE CHEST 1 VIEW COMPARISON:  Portable chest x-ray of August 31, 2017 FINDINGS: The lungs are adequately inflated. The interstitial markings remain increased but have improved slightly. Bibasilar atelectasis and small bilateral pleural effusions persist. The cardiac silhouette remains enlarged. The pulmonary vascularity is less engorged and more distinct. The sternal wires are intact. The right internal jugular Cordis sheath tip projects over the proximal SVC. There is calcification in the wall of the aortic arch.  IMPRESSION: Slight interval improvement in pulmonary interstitial edema. Persistent small bilateral pleural effusions and bibasilar atelectasis. Electronically Signed   By: David  Martinique M.D.   On: 09/01/2017 07:32     Assessment/Plan: S/P Procedure(s) (LRB): REPAIR OF RIGHT CORONARY ARTERY DISSECTION AND REPAIR OF AORTIC ROOT DISSECTION (N/A) CORONARY ARTERY BYPASS GRAFTING (CABG) x 1, using left greater saphenous vein harvested endoscopically (N/A) INTRAOPERATIVE TRANSESOPHAGEAL ECHOCARDIOGRAM (N/A) Sudden cardiovascular collapse now pressor dependent on vent , troponin 19 Echo to look at lv function ro tamponade, yesterday nl lv function by echo  CTA of chest/ abdomen to ro recurrent dissection and or major  abdominal event,  H/h report 5.5 but repeat is 8.5 and 27  Discussed decline in status with patient husband  Severe thrombocytopenia- no evidence of bleed in to chest on chest xray    Grace Isaac 2017-09-10 5:28 AM  Discussed case with Cardiology/ Dr Burt Knack , will take to cath lab, place iab and check right graft  Grace Isaac

## 2017-09-18 NOTE — Code Documentation (Signed)
Intern Pager:  Code called to Ms. Grout's room.   Went to code and on arrival was notified by code team that two attendings were already running the code and that I was not needed.   Signed sheet and left.

## 2017-09-18 NOTE — Progress Notes (Signed)
Around 7619 patient woke up and began screaming. Immediately went into room and found her pale, diaphoretic, and stating "I can't breathe." She said she felt like she was having a panic attack, and that she was very hot. Vital signs stable at that time. Sat her up on side of bed, breathing eased slightly. She began passing a lot of gas, and asked to be placed on bedside commode. BP stable, HR 100s, O2 sat 95%. Helped her up to bedside commode. She began complaining of abdominal pain, turned grey, and lost consciousness for a few seconds. She came back too almost immediately and continued moaning and saying she hurt, and "I can not do this." Transferred her back to bed with 4 RN assist. Dr. Servando Snare notified of situation. CCM called to bedside. STAT ABG, portable chest and abdominal films, and STAT labs collected. Patient further decompensated with CCM MDs at bedside and was emergent intubated, went PEA, CPR began. (see code sheet for code documentation.) Dr. Servando Snare came to bedside. IV pressors initiated. 2 units PRBC given emergently. Cardiology and echo tech at bedside for STAT ECHO. Family called- came to bedside and were updated of situation and plan to go to emergently to cath lab. Richarda Blade RN

## 2017-09-18 NOTE — Progress Notes (Signed)
PULMONARY / CRITICAL CARE MEDICINE   Name: Caroline Gray MRN: [id number] DOB: July 16, 1946    ADMISSION DATE:  08/30/2017 CONSULTATION DATE:  2017/09/11  REFERRING MD:  Cyndia Bent  CHIEF COMPLAINT:  Hypoxia  HISTORY OF PRESENT ILLNESS:  Pt is encephelopathic; therefore, this HPI is obtained from chart review. Caroline Gray is a 71 y.o. female with PMH as outlined below. She had elective cath 38/93 at [facility] complicated by RCA dissection and aortic root dissection.  She was emergently transferred to [facility] for CVTS consultation.  She was taken to the OR and had repair of RCA dissection via CABG x 1 (SGV to RCA).  During early AM hours 10/16, she complained of abdominal pain.  Got up to use bedside commode and had very small BM.  Had near syncopal episode and was helped back to bed.  After return to bed, became hypoxic and hypotensive despite neosynephrine.  Neo was increased and dopamine was started.  PCCM called in consultation urgently.  At the time of our arrival to bedside, pt has eyes open with blank gaze.  Skin very pale and dusky.  Pt non-responsive.  Shortly thereafter, she developed PEA arrest.  ACLS was started and pt received a total of 3mg  epi, 3 amps HCO3.  She was intubated (vent settings: FiO2 100 / PEEP 5 / Rate 20 / VT 420).  She was also started on epinephrine gtt with improvement in BP.  CXR demonstrated R > L edema with small effusion.  ABG 7.25 / 45 / 135. 10/16 0930 on max interventions    SUBJECTIVE:  Non-responsive.  VITAL SIGNS: BP (!) 146/125   Pulse (!) 120   Temp (!) 92.8 F (33.8 C)   Resp (!) 0   Ht 5\' 3"  (1.6 m)   Wt 191 lb 2.2 oz (86.7 kg)   SpO2 100%   BMI 33.86 kg/m   HEMODYNAMICS:    VENTILATOR SETTINGS: Vent Mode: PRVC FiO2 (%):  [100 %] 100 % Set Rate:  [20 bmp] 20 bmp Vt Set:  [420 mL-500 mL] 500 mL PEEP:  [5 cmH20] 5 cmH20 Plateau Pressure:  [15 cmH20-30 cmH20] 30 cmH20  INTAKE / OUTPUT: I/O last 3 completed shifts: In: 1549.2 [P.O.:120;  I.V.:929.2; IV Piggyback:500] Out: 1635 [Urine:1635]   PHYSICAL EXAMINATION: General:  Mottled female on max support HEENT: ET-> vent PSY:na Neuro: NO response CV: HSD PULM: decreased air movement TD:SKAJ, non-tender, no bs,  Extremities: cool decreased pulses Skin: sternal  wound and rt leg wound well approximated   LABS:  BMET  Recent Labs Lab Sep 11, 2017 0401 09-11-2017 0443 2017/09/11 0500 09/11/17 0735  NA 138 151* 144 143  K 4.6 3.0* 3.7 3.7  CL 105 101 101 108  CO2 23  --  27 12*  BUN 27* 21* 26* 25*  CREATININE 1.18* 0.80 1.29* 1.39*  GLUCOSE 186* 118* 167* 100*    Electrolytes  Recent Labs Lab 09/13/2017 2119 08/29/17 0319 08/29/17 1518  09-11-17 0401 2017/09/11 0500 09-11-17 0735  CALCIUM 7.3* 7.3*  --   < > 7.3* 6.4* 7.0*  MG 2.9* 2.8* 2.3  --   --   --   --   < > = values in this interval not displayed.  CBC  Recent Labs Lab September 11, 2017 0401 09-11-17 0443 September 11, 2017 0500 09-11-2017 0735  WBC 17.9*  --  14.0* PENDING  HGB 8.5* 5.1* 6.7* 7.1*  HCT 27.2* 15.0* 21.4* 22.8*  PLT 55*  --  42* PENDING    Coag's  Recent Labs Lab 08/25/2017 1721 2017-09-19 0500 2017-09-19 0735  APTT 47*  --  56*  INR 1.66 1.57 2.21    Sepsis Markers No results for input(s): LATICACIDVEN, PROCALCITON, O2SATVEN in the last 168 hours.  ABG  Recent Labs Lab 2017/09/19 0419 09/19/2017 0510 09-19-17 0531  PHART 7.251* 7.329* 7.319*  PCO2ART 45.5 50.6* 39.7  PO2ART 135.0* 53.0* 56.0*    Liver Enzymes  Recent Labs Lab 09-19-2017 0735  AST 132*  ALT 112*  ALKPHOS 80  BILITOT 1.2  ALBUMIN 2.0*    Cardiac Enzymes  Recent Labs Lab September 19, 2017 0401 19-Sep-2017 0504 09/19/17 0735  TROPONINI 19.90* 19.92* 23.70*    Glucose  Recent Labs Lab 09/01/17 0739 09/01/17 1245 09/01/17 1732 09/01/17 2007 09/01/17 2155 2017-09-19 0408  GLUCAP 109* 158* 139* 126* 127* 147*    Imaging Dg Abd 1 View  Result Date: 2017/09/19 CLINICAL DATA:  Five days postop CABG. Patient  woke up with abdominal pain. EXAM: ABDOMEN - 1 VIEW COMPARISON:  CT abdomen and pelvis 01/13/2017 FINDINGS: Calcified granulomas in the left upper quadrant, likely in the spleen. No radiopaque stones. Degenerative changes in the lumbar spine and hips. Scattered gas and stool in the colon. No small or large bowel distention. Surgical clips in the right upper quadrant. IMPRESSION: Nonobstructive bowel gas pattern. Electronically Signed   By: Lucienne Capers M.D.   On: 2017-09-19 04:31   Dg Chest Port 1 View  Result Date: 09/19/2017 CLINICAL DATA:  Initial evaluation for endotracheal tube placement. EXAM: PORTABLE CHEST 1 VIEW COMPARISON:  Prior radiograph from earlier same day. FINDINGS: Patient now intubated with the tip of an endotracheal tube positioned approximately 2.2 cm above the carina. Large bore right IJ approach centra venous catheter overlies the proximal SVC. Defibrillator pads overlie the chest. Median sternotomy wires underlying CABG markers. Stable cardiomegaly. Mediastinal silhouette normal. Persistent bilateral pleural effusions. Bibasilar opacities likely effusion and/ or atelectasis. Underlying pulmonary edema. Appearance overall similar to previous. No pneumothorax. IMPRESSION: 1. Tip of the endotracheal tube positioned 2.2 cm above the carina. 2. Stable cardiomegaly with diffuse pulmonary edema and bilateral pleural effusions. Bibasilar opacities likely reflect atelectasis. Appearance is overall similar to previous. Electronically Signed   By: Jeannine Boga M.D.   On: 19-Sep-2017 05:23   Dg Chest Port 1 View  Result Date: 2017/09/19 CLINICAL DATA:  Five days postoperative CABG. Change in behavior. Abdominal pain and shortness of breath. EXAM: PORTABLE CHEST 1 VIEW COMPARISON:  09/01/2017 FINDINGS: Postoperative changes in the mediastinum. Right central venous catheter with tip over the mid SVC region. No pneumothorax. Cardiac enlargement with mild vascular congestion. Small  bilateral pleural effusions. Atelectasis or infiltration in the lung bases. Changes are more prominent on the right. Similar appearance to previous study allowing for differences in technique. Surgical clips in the right axilla. Degenerative changes in the spine and shoulders. Calcification of the aorta. IMPRESSION: Cardiac enlargement. Small bilateral pleural effusions with basilar atelectasis, greater on the right. No significant change since previous study. Electronically Signed   By: Lucienne Capers M.D.   On: 09-19-17 04:30     STUDIES:  CXR 10/16 > small bilateral effusions, R > L edema. AXR 10/16 > non obstructive gas pattern.  Echo 10/15 > EF 55-60%. CTA chest / abd / pelv 10/16 > can't do due to instability  CULTURES: None.  ANTIBIOTICS: None.  SIGNIFICANT EVENTS: 10/11 > admit. 10/16 > PEA arrest. 10/16 fast   LINES/TUBES: ETT 10/16 >  R IJ 10/11 >  DISCUSSION: 71 y.o. female admitted 10/11 for emergent cardiac surgery due to RCA and aortic root dissection.  Taken to OR for repair and tolerated well.  10/16, had hypoxia and worsening hypotension followed by cardiac arrest. Unable to intervene in cath lab due to instability. She is on maxium pressors and there is little hope for survival.   ASSESSMENT / PLAN:  PULMONARY A: Respiratory insufficiency - due to cardiac arrest. Mild acute pulmonary edema. Small bilateral effusions R > L. P:   Full vent support. VAP prevention measures. She will most likely not survive   CARDIOVASCULAR A:  Shock - presumed combo cardiogenic + hemorrhagic. 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 A.fib. Hx HTN, HLD. P:  Continue pressors, no vasopressin in ?GIB Trend troponins, lactate. CVTS and cardiology following. Continue amiodarone, simvastatin. Hold preadmission lopressor. Unable to intervene in cath lab as she coded again.  RENAL Lab Results  Component Value Date    CREATININE 1.39 (H) 2017/09/30   CREATININE 1.29 (H) 09-30-2017   CREATININE 0.80 09-30-2017    Recent Labs Lab September 30, 2017 0443 September 30, 2017 0500 Sep 30, 2017 0735  K 3.0* 3.7 3.7     A:   AKI. Acidosis post code P:   Follow lytes and replete as needed. See pulmonary No NACHO3 drip at this time. Check lactic acid  GASTROINTESTINAL A:   GI prophylaxis. Nutrition. Hx IBS, diverticulosis. ? GIB P:   SUP: Pantoprazole. NPO. To sick for abd C T Check LDH  HEMATOLOGIC  Recent Labs  30-Sep-2017 0500 09/30/17 0735  HGB 6.7* 7.1*    A:   Acute blood loss anemia - unclear etiology at this point, presumed intrabdominal source as CXR not that impressive. Thrombocytopenia - chronic. VTE Prophylaxis. P:  Transfuse  PRBC as needed. Maintain Hgb > 8 given cardiac history. Needs CTA chest/abdomen / pelvis once stable but not likely to survive. SCD's only. CBC as needed .  INFECTIOUS A:   No indication of infection. P:   Monitor clinically.  ENDOCRINE CBG (last 3)   Recent Labs  09/01/17 2007 09/01/17 2155 09-30-2017 0408  GLUCAP 126* 127* 147*     A:   Hx DM.  P:   SSI. Hold preadmission lantus, metformin.  NEUROLOGIC A:   Acute encephalopathy. Hx Guillain barre, anxiety. She was awake after leaving cath lab 10/16 am P:   Sedation:  Fentanyl PRN / Midazolam PRN. RASS goal: 0 to -1. Daily WUA. Hold preadmission duloxetine, gabapentin.  Family updated: Family updated by Dr. Titus Mould and aware of low probability of any meaningful chance of survival. Maximum interventions in place 10/16 0930.   Interdisciplinary Family Meeting v Palliative Care Meeting:  Due by: 09/08/17.  CC time: 45 min.   Richardson Landry Minor ACNP Maryanna Shape PCCM Pager [phone] till 3 pm If no answer page [phone] 30-Sep-2017, 9:32 AM   10 am  STAFF NOTE: I, Merrie Roof, MD FACP have personally reviewed patient's available data, including medical history, events of note, physical  examination and test results as part of my evaluation. I have discussed with resident/NP and other care providers such as pharmacist, RN and RRT. In addition, I personally evaluated patient and elicited key findings of: awakens slight, fc per rn, ronchi, not tachy, abdo soft, low BS, no overt bruising, no r/g, some distention left lower worse then rt, edema mild, levido reti on abdo and legs, lines clean, noted sudden drop in hgb, although I  dont see any evidence of overt blood loss, possible she had retroper bleed but would be more concerned about bowel ischemia with worsening acidosis and Lactic acidosis, plan is aggressive prbc Transfusion, she is too critical to obtain CT abdo pelvis now, follow hgb, epi is max at 25, levophed can max to 75, no role vasopressin add with bowel ischemia concerns, no role increasing neo, stat lactic repeat, LDH, ph now 6.95, will increase rr 35, repeat abg, keep on 100%, avoid high peep with shock state, pcxr I reviewed showed diffuse int edema likely, ett wnl, would add fent drip low dose for comfort with hig hMV, cbc to follow, likely has consumptive coagulapthy, HIT elia back and neg and clinically not matching for HITT, she ouwl not be a candidate for ex lap would expire on table given PH and refractory shock, cvts at bedside extensive with me, I do not feel this is survivable despite such heroic efforts for her care all night and today  I have had extensive discussions with family husband and kids. We discussed patients current circumstances and organ failures. We also discussed patient's prior wishes under circumstances such as this. Family has decided to NOT perform resuscitation if arrest but to continue current medical support for now.   The patient is critically ill with multiple organ systems failure and requires high complexity decision making for assessment and support, frequent evaluation and titration of therapies, application of advanced monitoring  technologies and extensive interpretation of multiple databases.   Critical Care Time devoted to patient care services described in this note is60 Minutes. This time reflects time of care of this signee: Merrie Roof, MD FACP. This critical care time does not reflect procedure time, or teaching time or supervisory time of PA/NP/Med student/Med Resident etc but could involve care discussion time. Rest per NP/medical resident whose note is outlined above and that I agree with   Lavon Paganini. Titus Mould, MD, [place] Pgr: [facility] 09-16-17 12:56 PM

## 2017-09-18 NOTE — Progress Notes (Signed)
Progress Note  Patient Name: Caroline Gray Date of Encounter: September 16, 2017  Primary Cardiologist: Dr Fletcher Anon  Subjective   Called by Dr Servando Snare and cardiology fellow this am after the patient arrested. She was stable until early this am when she sat up in bed, felt abdominal discomfort and called out that she was feeling bad, and suddenly arrested (PEA). She has required 2 units PRBC's, 3 vasoactive drugs, and now is maintaining a BP in the 70-80 range. No other hx available from patient. Pt now intubated. An echo has been done showing no pericardial effusion.  Inpatient Medications    Scheduled Meds: . acetaminophen  1,000 mg Oral Q6H   Or  . acetaminophen (TYLENOL) oral liquid 160 mg/5 mL  1,000 mg Per Tube Q6H  . aspirin EC  325 mg Oral Daily   Or  . aspirin  324 mg Per Tube Daily  . bisacodyl  10 mg Oral Daily   Or  . bisacodyl  10 mg Rectal Daily  . docusate sodium  200 mg Oral Daily  . EPINEPHrine      . fentaNYL      . furosemide  20 mg Intravenous BID  . insulin aspart  0-15 Units Subcutaneous Q4H  . ipratropium-albuterol      . mouth rinse  15 mL Mouth Rinse BID  . midazolam      . pantoprazole (PROTONIX) IV  40 mg Intravenous Daily  . simvastatin  40 mg Oral QHS  . sodium bicarbonate      . sodium bicarbonate      . sodium chloride flush  3 mL Intravenous Q12H   Continuous Infusions: . sodium chloride Stopped (08/30/17 1900)  . sodium chloride    . sodium chloride Stopped (08/30/17 1700)  . sodium chloride    . sodium chloride    . albumin human    . amiodarone 30 mg/hr (16-Sep-2017 0300)  . DOPamine    . epinephrine 10 mcg/min (2017/09/16 0518)  . lactated ringers Stopped (08/30/17 1900)  . lactated ringers 10 mL/hr at 09/01/17 1300  . nitroGLYCERIN Stopped (08/24/2017 1845)  . norepinephrine (LEVOPHED) Adult infusion 20 mcg/min (September 16, 2017 0516)  . phenylephrine (NEO-SYNEPHRINE) Adult infusion 40 mcg/min (2017-09-16 0300)   PRN Meds: sodium chloride,  Place/Maintain arterial line **AND** sodium chloride, fentaNYL (SUBLIMAZE) injection, fentaNYL (SUBLIMAZE) injection, metoprolol tartrate, midazolam, midazolam, ondansetron (ZOFRAN) IV, sodium chloride flush   Vital Signs    Vitals:   09/16/2017 0345 2017/09/16 0400 09-16-17 0450 16-Sep-2017 0515  BP: (!) 111/54 91/78 (!) 112/98 (!) 60/30  Pulse: (!) 106 (!) 114 89   Resp: (!) 29 (!) 22 20 20   Temp:      TempSrc:      SpO2: 95% (!) 86% 98%   Weight:      Height:        Intake/Output Summary (Last 24 hours) at 09-16-2017 0655 Last data filed at 2017/09/16 0600  Gross per 24 hour  Intake           617.32 ml  Output             1000 ml  Net          -382.68 ml   Filed Weights   08/30/17 0530 08/31/17 0615 09/01/17 0500  Weight: 195 lb 15.8 oz (88.9 kg) 192 lb 10.9 oz (87.4 kg) 191 lb 4.8 oz (86.8 kg)    Telemetry    Sinus tachycardia - Personally Reviewed  Physical Exam  Intubated,  unresponsive GEN: intubated Neck: No JVD Cardiac: tachycardic Respiratory: Clear to auscultation bilaterally. GI: Soft MS: diffuse edema Neuro:  Nonfocal  Psych: Normal affect   Labs    Chemistry Recent Labs Lab 09/01/17 0340 September 08, 2017 0401 09-08-2017 0443 2017/09/08 0500  NA 140 138 151* 144  K 3.9 4.6 3.0* 3.7  CL 108 105 101 101  CO2 26 23  --  27  GLUCOSE 102* 186* 118* 167*  BUN 21* 27* 21* 26*  CREATININE 0.76 1.18* 0.80 1.29*  CALCIUM 7.2* 7.3*  --  6.4*  GFRNONAA >60 45*  --  40*  GFRAA >60 52*  --  46*  ANIONGAP 6 10  --  16*     Hematology Recent Labs Lab 09/01/17 0340 08-Sep-2017 0401 09-08-17 0443 09/08/17 0500  WBC 13.3* 17.9*  --  14.0*  RBC 2.79* 2.63*  --  2.08*  HGB 8.9* 8.5* 5.1* 6.7*  HCT 27.8* 27.2* 15.0* 21.4*  MCV 99.6 103.4*  --  102.9*  MCH 31.9 32.3  --  32.2  MCHC 32.0 31.3  --  31.3  RDW 17.6* 18.1*  --  18.1*  PLT 40* 55*  --  42*    Cardiac Enzymes Recent Labs Lab 08-Sep-2017 0401 2017/09/08 0504  TROPONINI 19.90* 19.92*   No results for input(s):  TROPIPOC in the last 168 hours.   BNPNo results for input(s): BNP, PROBNP in the last 168 hours.   DDimer No results for input(s): DDIMER in the last 168 hours.   Radiology    Dg Abd 1 View  Result Date: 09-08-2017 CLINICAL DATA:  Five days postop CABG. Patient woke up with abdominal pain. EXAM: ABDOMEN - 1 VIEW COMPARISON:  CT abdomen and pelvis 01/13/2017 FINDINGS: Calcified granulomas in the left upper quadrant, likely in the spleen. No radiopaque stones. Degenerative changes in the lumbar spine and hips. Scattered gas and stool in the colon. No small or large bowel distention. Surgical clips in the right upper quadrant. IMPRESSION: Nonobstructive bowel gas pattern. Electronically Signed   By: Lucienne Capers M.D.   On: 09/08/17 04:31   Dg Chest Port 1 View  Result Date: 09/08/17 CLINICAL DATA:  Initial evaluation for endotracheal tube placement. EXAM: PORTABLE CHEST 1 VIEW COMPARISON:  Prior radiograph from earlier same day. FINDINGS: Patient now intubated with the tip of an endotracheal tube positioned approximately 2.2 cm above the carina. Large bore right IJ approach centra venous catheter overlies the proximal SVC. Defibrillator pads overlie the chest. Median sternotomy wires underlying CABG markers. Stable cardiomegaly. Mediastinal silhouette normal. Persistent bilateral pleural effusions. Bibasilar opacities likely effusion and/ or atelectasis. Underlying pulmonary edema. Appearance overall similar to previous. No pneumothorax. IMPRESSION: 1. Tip of the endotracheal tube positioned 2.2 cm above the carina. 2. Stable cardiomegaly with diffuse pulmonary edema and bilateral pleural effusions. Bibasilar opacities likely reflect atelectasis. Appearance is overall similar to previous. Electronically Signed   By: Jeannine Boga M.D.   On: 2017-09-08 05:23   Dg Chest Port 1 View  Result Date: Sep 08, 2017 CLINICAL DATA:  Five days postoperative CABG. Change in behavior. Abdominal pain  and shortness of breath. EXAM: PORTABLE CHEST 1 VIEW COMPARISON:  09/01/2017 FINDINGS: Postoperative changes in the mediastinum. Right central venous catheter with tip over the mid SVC region. No pneumothorax. Cardiac enlargement with mild vascular congestion. Small bilateral pleural effusions. Atelectasis or infiltration in the lung bases. Changes are more prominent on the right. Similar appearance to previous study allowing for differences in technique. Surgical clips in  the right axilla. Degenerative changes in the spine and shoulders. Calcification of the aorta. IMPRESSION: Cardiac enlargement. Small bilateral pleural effusions with basilar atelectasis, greater on the right. No significant change since previous study. Electronically Signed   By: Lucienne Capers M.D.   On: September 30, 2017 04:30   Dg Chest Port 1 View  Result Date: 09/01/2017 CLINICAL DATA:  Status post CABG 4 days ago EXAM: PORTABLE CHEST 1 VIEW COMPARISON:  Portable chest x-ray of August 31, 2017 FINDINGS: The lungs are adequately inflated. The interstitial markings remain increased but have improved slightly. Bibasilar atelectasis and small bilateral pleural effusions persist. The cardiac silhouette remains enlarged. The pulmonary vascularity is less engorged and more distinct. The sternal wires are intact. The right internal jugular Cordis sheath tip projects over the proximal SVC. There is calcification in the wall of the aortic arch. IMPRESSION: Slight interval improvement in pulmonary interstitial edema. Persistent small bilateral pleural effusions and bibasilar atelectasis. Electronically Signed   By: David  Martinique M.D.   On: 09/01/2017 07:32    Cardiac Studies   2D Echo 09-01-2017: Study Conclusions  - Left ventricle: Posterior basal hypokinesis The cavity size was   normal. Wall thickness was normal. Systolic function was normal.   The estimated ejection fraction was in the range of 55% to 60%.   Left ventricular diastolic  function parameters were normal. - Mitral valve: Valve area by pressure half-time: 2.1 cm^2. - Impressions: Possible left pleural effusion.  Impressions:  - Possible left pleural effusion.  Patient Profile     71 y.o. female admitted 08/29/2017 with iatrogenic coronary artery and a ascending aortic dissection, underwent emergency CABG with a saphenous vein graft to distal RCA and suture repair of focal aortic root dissection  Assessment & Plan    1.  PEA arrest/shock: sudden collapse this morning. Bedside echo shows no effusion, moderate RV dysfunction, normal LV function. Discussed with cards fellow and Dr Servando Snare. Plan for emergency cath/possible PCI to evaluate for acute SVG occlusion, aortic dissection, and consider IABP placement. Discussed plan with patient's husband prior to taking her to the cath lab. reviewed risks/indications with him.   Pt on high doses of epi, dopamine, and levophed. She has received 2 units PRBC's. Continue supportive measures of fluid resuscitation, PRBC's, and pressor agents.   2. Acute inferior STEMI/aortic root dissection: s/p surgical suture repair and single vessel CABG. Previously stable. See discussion above.   3. Anemia: possible GI bleed as source of collapse. Fluid/prbc resuscitation. Follow serial H/H closely.  4. Post-op AF: maintaining sinus rhythm this am.   The patient is critically ill with multiple organ systems failure and requires high complexity decision making for assessment and support, frequent evaluation and titration of therapies, application of advanced monitoring technologies and extensive interpretation of multiple databases.   Critical Care Time devoted to patient care services described in this note is 45 minutes.   For questions or updates, please contact [place] Please consult [email or web address] for contact info under Cardiology/STEMI.     Deatra James, MD  Sep 30, 2017, 6:55 AM

## 2017-09-18 NOTE — Progress Notes (Signed)
Patient transported from 2h17 to Cath lab without incident.

## 2017-09-18 NOTE — Progress Notes (Signed)
Cardiology fellow called. Echo team being called in.

## 2017-09-18 NOTE — Progress Notes (Signed)
Drs Roxy Manns and Roxan Hockey notified of pt continued low blood pressure despite maximum drip support and blood products.  Dr. Roxan Hockey to come assess patient.  Will continue to monitor pt closely.

## 2017-09-18 NOTE — Progress Notes (Signed)
Orthopedic Tech Progress Note Patient Details:  Caroline Gray 04/30/1946 [id number]  Ortho Devices Type of Ortho Device: Knee Immobilizer   [name] 09-14-17, 9:15 AM

## 2017-09-18 NOTE — Progress Notes (Signed)
CRITICAL VALUE ALERT  Critical Value:  Lactic Acid Date & Time Notied:  09/03/2017 1000 Provider Notified: Richardson Landry Minor  Orders Received/Actions taken:continue to assess pt

## 2017-09-18 NOTE — Progress Notes (Signed)
Dr. Roxy Manns notfied of pt death.

## 2017-09-18 NOTE — Death Summary Note (Signed)
Physician Discharge Summary       [address]       [place] [address]             [phone]    Patient ID: Caroline Gray MRN: [id number] DOB/AGE: October 10, 1946 71 y.o.  Admit date: 08/21/2017 Discharge date: 09/03/2017  Admission Diagnoses: 1. S/p STEMI 2. RCA dissection  Active Diagnoses:  1. Diabetes mellitus 2. Hyperlipidemia 3. Hypertension 4. 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 Anxiety 7. Adenomatous colon polyp 8. Cataract 9. Arthritis 10. History of UTI 11. IBS (irritable bowel syndrome) 12. Positive PPD 13. Psoriasis 14. History of kidney stones 15. Cardiopulmonary arrest with successful resuscitation ([place]) 16. Acute hypoxemic respiratory failure (H[facility]) 17. Abdominal pain 18. Post op atrial fibrillation 19. Cardiogenic and hypovolemic shock  Consults: pulmonary/intensive care  Procedure (s):  INTRAVASCULAR PRESSURE WIRE/FFR STUDY  LEFT HEART CATH AND CORONARY ANGIOGRAPHY by Dr. Fletcher Anon on 09/16/2017:  Conclusion     The left ventricular systolic function is normal.  LV end diastolic pressure is mildly elevated.  The left ventricular ejection fraction is 55-65% by visual estimate.  Prox RCA lesion, 65 %stenosed.  Mid LAD lesion, 20 %stenosed.  Mid Cx lesion, 30 %stenosed.   1. Borderline significant one-vessel coronary artery disease affecting the proximal right coronary artery. 2. Normal LV systolic function mildly elevated left ventricular end-diastolic pressure. 3. Iatrogenic dissection of the right coronary artery extending into the ascending aorta was likely wire-induced. 4. 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  Recommendations: The patient needs emergent ascending aortic repair and 1 vessel CABG to the right coronary artery. The patient was transferred emergently to [facility] and I discussed the case with Dr. Cyndia Bent.      1. Median Sternotomy 2. Extracorporeal circulation 3.   Coronary artery bypass grafting x 1   SVG to distal RCA  4.   Endoscopic vein harvest from the left leg 5.   Right axillary artery exposure with 8 mm Hemashield graft anastomosed to axillary artery for arterial inflow. 6.   Suture repair of focal aortic root dissection with closure of right coronary artery ostium by Dr. Cyndia Bent on 09/10/2017.  History of Presenting Illness: The patient is a 71 year old woman with diabetes mellitus, hypertension, hyperlipidemia with a history of vague chest pain and shortness of breath with exertion who underwent elective cath by Dr. Fletcher Anon at [facility] on 08/26/2017. This showed some mild proximal RCA stenosis that looked less than 50% to me. She had a 102F guide catheter inserted and a flow wire and subsequently developed RCA dissection with aortic root dissection. She started having chest pain, bradycardia, and third degree AV block (due to RCA dissection) and had pacer inserted. Dr. Fletcher Anon called Dr. Cyndia Bent in the office and patient was transferred emergently to [facility] for surgery. She is still having some chest pain and ST elevation o monitor. She is in sinus 80's.  Dr. Cyndia Bent discussed with the patient and her husband the need for emergent CABG to the RCA, possible repair of aortic root and ascending aortic dissection, possible AVR. She agreed to proceed with surgery. She underwent emergent right axillary exposure, CABG x 1, and suture repair of focal aortic root dissection with closure of the right coronary artery system.   Brief Hospital Course:  The patient was extubated the afternoon of surgery. She did desat and was placed on HFNC. Oxygen  saturation was 970100% once probe was relocated to forehead. She remained afebrile and hemodynamically stable. She was on Milrinone, Levophed, and Neo Synephrine drips. Daily co ox was obtained and Milrinone was weaned accordingly.  Gordy Councilman and chest tubes were removed early in her post op course.   She had ABL anemia. Se did  require a post op transfusion. She also had thrombocytopenia. Her platelet count went as low as 40,000. Heparin induced platelet antibody was 0.213. She was weaned off the insulin drip. She then went into a fib on 09/01/2017. She was put on IV Amiodarone. Echo was done on 09/01/2017 and LVEF was 55-60%, possible left pleural effusion, no significant valvular disease, and pericardium was normal in appearance. Early the morning of 09/15/17, patient was pale, diaphoretic, and has complaints of trouble breathing. Vital signs were stable. CCM was consulted.Stat ABG, abdominal film, and stat labs drawn. Dr. Servando Snare then arrived at bedside. IV pressors were initiated. Her H and H was decreased to 6.7 and 21.4 and platelet count of 42,000. She was given PRBCs. H and H was then 8.5 and 27. Abdominal x ray showed non obstructive gas pattern.  She then had a cardiac arrest. She was intubated. Stat bedside echo showed under filled LV, small pericardial effusion, and hypokinetic wall motion. On exam, her abdomen was apparently more distended. She had metabolic acidosis and was put on a bicarb drip. She was too unstable to be transported to CT(to check for bleeding). Her husband was informed of the decline in her condition. She was transported to cath lab emergently on 2017-09-15. Results showed patent SVG to RCA, no evidence of residual aortic dissection, severe AI by aortic root angiogram, but TEE previously showed normal aortic valve, and severely elevated LVEDP. She was hypotensive and on multiple pressors. She had received several units of PRBC for anemia. Most recent ABG showed severe metabolic acidosis (pH 6.9 and base deficit 23) and her skin was mottled. PT increased from 18.6 to 24.3. 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 Family updated and she was a DNR. Unfortunately, she expired the  afternoon of 09/02/2016.  Latest Vital Signs: Blood pressure (!) 84/47, pulse (!) 0, temperature (!) 94.3 F (34.6 C), resp. rate (!) 0, height 5\' 3"  (1.6 m), weight 191 lb 2.2 oz (86.7 kg), SpO2 92 %.   Recent laboratory studies:  Lab Results  Component Value Date   WBC 13.1 (H) Sep 15, 2017   HGB 8.9 (L) 15-Sep-2017   HCT 29.2 (L) 2017-09-15   MCV 95.4 09-15-2017   PLT 22 (LL) Sep 15, 2017   Lab Results  Component Value Date   NA 143 15-Sep-2017   K 3.7 2017-09-15   CL 108 September 15, 2017   CO2 12 (L) 15-Sep-2017   CREATININE 1.39 (H) 09/15/17   GLUCOSE 100 (H) 2017/09/15    Diagnostic Studies: Dg Chest 2 View  Result Date: 08/22/2017 CLINICAL DATA:  Pre-op respiratory exam.  Former smoker. EXAM: CHEST  2 VIEW COMPARISON:  12/06/2005 FINDINGS: The heart size and mediastinal contours are within normal limits. Aortic atherosclerosis. Both lungs are clear. Small calcified granuloma again seen in left lower lobe. The visualized skeletal structures are unremarkable. IMPRESSION: No active cardiopulmonary disease. Electronically Signed   By: Earle Gell M.D.   On: 08/22/2017 16:34   Dg Abd 1 View  Result Date: 09/15/17 CLINICAL DATA:  Five days postop CABG. Patient woke up with abdominal pain. EXAM: ABDOMEN - 1 VIEW COMPARISON:  CT abdomen and pelvis 01/13/2017  FINDINGS: Calcified granulomas in the left upper quadrant, likely in the spleen. No radiopaque stones. Degenerative changes in the lumbar spine and hips. Scattered gas and stool in the colon. No small or large bowel distention. Surgical clips in the right upper quadrant. IMPRESSION: Nonobstructive bowel gas pattern. Electronically Signed   By: Lucienne Capers M.D.   On: 09-08-2017 04:31   Dg Chest Port 1 View  Result Date: 08-Sep-2017 CLINICAL DATA:  Initial evaluation for endotracheal tube placement. EXAM: PORTABLE CHEST 1 VIEW COMPARISON:  Prior radiograph from earlier same day. FINDINGS: Patient now intubated with the tip of an  endotracheal tube positioned approximately 2.2 cm above the carina. Large bore right IJ approach centra venous catheter overlies the proximal SVC. Defibrillator pads overlie the chest. Median sternotomy wires underlying CABG markers. Stable cardiomegaly. Mediastinal silhouette normal. Persistent bilateral pleural effusions. Bibasilar opacities likely effusion and/ or atelectasis. Underlying pulmonary edema. Appearance overall similar to previous. No pneumothorax. IMPRESSION: 1. Tip of the endotracheal tube positioned 2.2 cm above the carina. 2. Stable cardiomegaly with diffuse pulmonary edema and bilateral pleural effusions. Bibasilar opacities likely reflect atelectasis. Appearance is overall similar to previous. Electronically Signed   By: Jeannine Boga M.D.   On: 09-08-2017 05:23   Dg Chest Port 1 View  Result Date: 09/08/2017 CLINICAL DATA:  Five days postoperative CABG. Change in behavior. Abdominal pain and shortness of breath. EXAM: PORTABLE CHEST 1 VIEW COMPARISON:  09/01/2017 FINDINGS: Postoperative changes in the mediastinum. Right central venous catheter with tip over the mid SVC region. No pneumothorax. Cardiac enlargement with mild vascular congestion. Small bilateral pleural effusions. Atelectasis or infiltration in the lung bases. Changes are more prominent on the right. Similar appearance to previous study allowing for differences in technique. Surgical clips in the right axilla. Degenerative changes in the spine and shoulders. Calcification of the aorta. IMPRESSION: Cardiac enlargement. Small bilateral pleural effusions with basilar atelectasis, greater on the right. No significant change since previous study. Electronically Signed   By: Lucienne Capers M.D.   On: 2017-09-08 04:30   Dg Chest Port 1 View  Result Date: 09/01/2017 CLINICAL DATA:  Status post CABG 4 days ago EXAM: PORTABLE CHEST 1 VIEW COMPARISON:  Portable chest x-ray of August 31, 2017 FINDINGS: The lungs are  adequately inflated. The interstitial markings remain increased but have improved slightly. Bibasilar atelectasis and small bilateral pleural effusions persist. The cardiac silhouette remains enlarged. The pulmonary vascularity is less engorged and more distinct. The sternal wires are intact. The right internal jugular Cordis sheath tip projects over the proximal SVC. There is calcification in the wall of the aortic arch. IMPRESSION: Slight interval improvement in pulmonary interstitial edema. Persistent small bilateral pleural effusions and bibasilar atelectasis. Electronically Signed   By: David  Martinique M.D.   On: 09/01/2017 07:32   Dg Chest Port 1 View  Result Date: 08/31/2017 CLINICAL DATA:  Status post coronary bypass graft. EXAM: PORTABLE CHEST 1 VIEW COMPARISON:  Radiograph of August 29, 2017. FINDINGS: Stable cardiomegaly. No pneumothorax is noted. Endotracheal and nasogastric tubes have been removed. Swan-Ganz catheter has been removed. Right internal jugular sheath remains. Atherosclerosis of thoracic aorta is noted. Increased bibasilar edema or atelectasis is noted with increased pleural effusions. Bony thorax is unremarkable. IMPRESSION: Endotracheal and nasogastric tubes have been removed. Increased bibasilar edema or atelectasis is noted with increased pleural effusions. Electronically Signed   By: Marijo Conception, M.D.   On: 08/31/2017 07:15   Dg Chest Port 1 View  Result Date:  08/29/2017 CLINICAL DATA:  Respiratory difficulty EXAM: PORTABLE CHEST 1 VIEW COMPARISON:  09/12/2017 FINDINGS: Endotracheal tube, NG tube, chest tubes, Swan-Ganz catheter with its tip in the main pulmonary artery, and percutaneous pacemaker wires are stable. Diffuse interstitial edema is stable. Cardiomegaly. No pneumothorax. Low lung volumes and bibasilar atelectasis is worse. IMPRESSION: Worsening atelectasis at the bases. Stable interstitial edema. No pneumothorax. Electronically Signed   By: Marybelle Killings M.D.    On: 08/29/2017 06:59   Dg Chest Port 1 View  Result Date: 09/17/2017 CLINICAL DATA:  71 year old female status post repair of coronary artery dissection and CABG postop day 0. EXAM: PORTABLE CHEST 1 VIEW COMPARISON:  08/22/2017 and earlier. FINDINGS: Portable AP semi upright view at at 1735 hours. Endotracheal tube tip in good position between the level of the clavicles and carina. Enteric tube courses to the abdomen, side hole up the level of the proximal gastric body. Right IJ approach Swan-Ganz catheter, tip at the main pulmonary artery level. Left chest tube and mediastinal tube. Epicardial pacer wires. Sternotomy and CABG changes. No pneumothorax or pulmonary edema. Mediastinal contours are within normal limits. Small calcified lymph nodes including at the left hilum. Mild streaky bibasilar opacity most resembling atelectasis. Multiple surgical clips projecting over the right scapula are new. Paucity of bowel gas in the upper abdomen. IMPRESSION: 1. Lines and tubes appear appropriately placed. 2. No pneumothorax or pulmonary edema.  Mild basilar atelectasis. Electronically Signed   By: Genevie Ann M.D.   On: 09/12/2017 17:59     Discharge Medications: Allergies as of 2017/09/24      Reactions   Influenza Vaccines Other (See Comments)   Has/had "Guillain-Barr Syndrome" and cannot have ANY VACCINATIONS!!   Pneumococcal Vaccines    Has/had "Guillain-Barr Syndrome" and cannot have ANY VACCINATIONS!!   Codeine Nausea And Vomiting   Nausea and vomiting   Macrodantin Nausea And Vomiting   Ioxaglate Palpitations, Other (See Comments)   Causes shaking and rapid heartbeats   Ivp Dye [iodinated Diagnostic Agents] Palpitations, Other (See Comments)   Causes shaking and rapid heartbeats          Signed: Phares Zaccone MPA-C 09/03/2017, 1:52 PM

## 2017-09-18 DEATH — deceased

## 2017-10-03 ENCOUNTER — Ambulatory Visit: Payer: Medicare Other | Admitting: Cardiovascular Disease

## 2017-10-30 ENCOUNTER — Ambulatory Visit: Payer: Medicare Other | Admitting: Family Medicine

## 2018-11-03 IMAGING — CR DG CHEST 2V
2 series · 2 of 2 positions shown · non-contrast
Comparison: 12/06/2005

CLINICAL DATA: Pre-op respiratory exam.  Former smoker.

EXAM:
CHEST  2 VIEW

[chest pa]
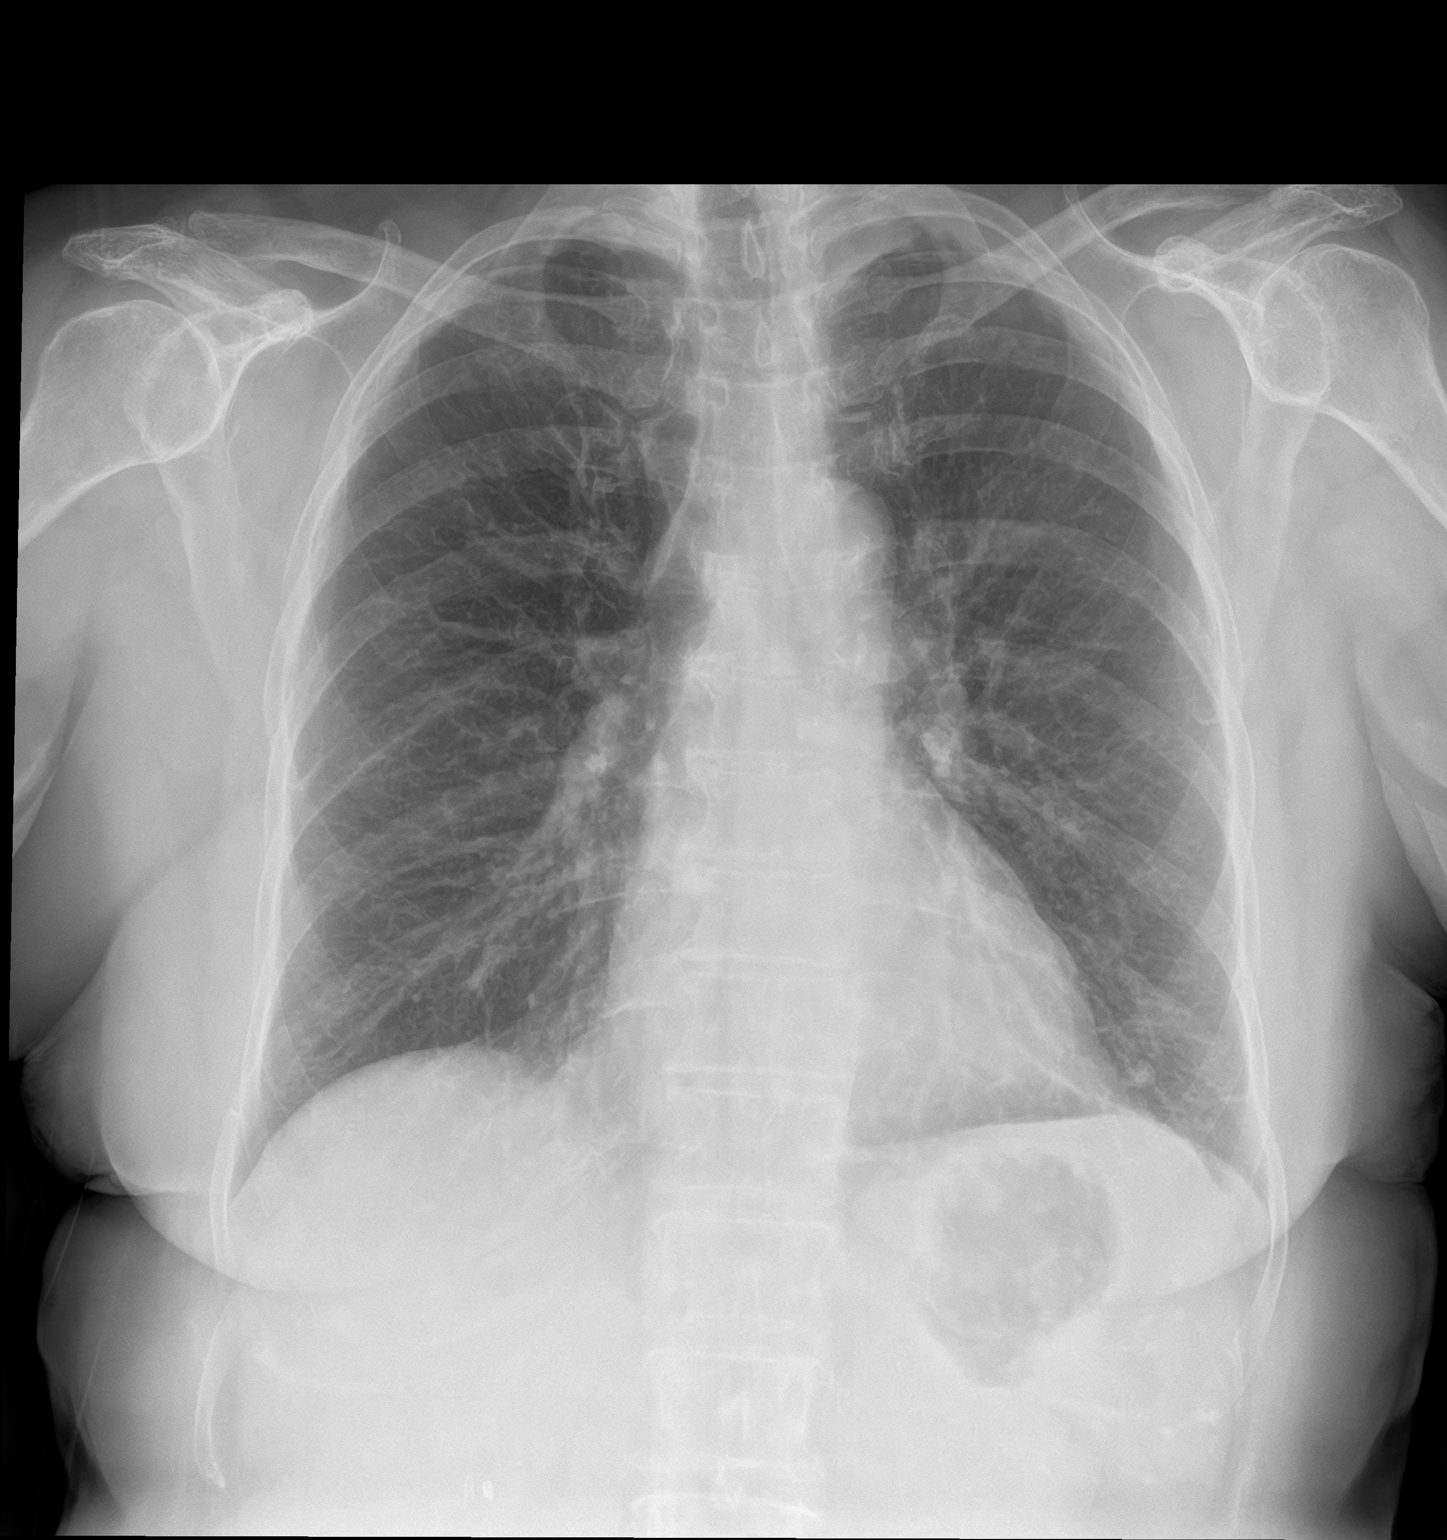

[chest lat]
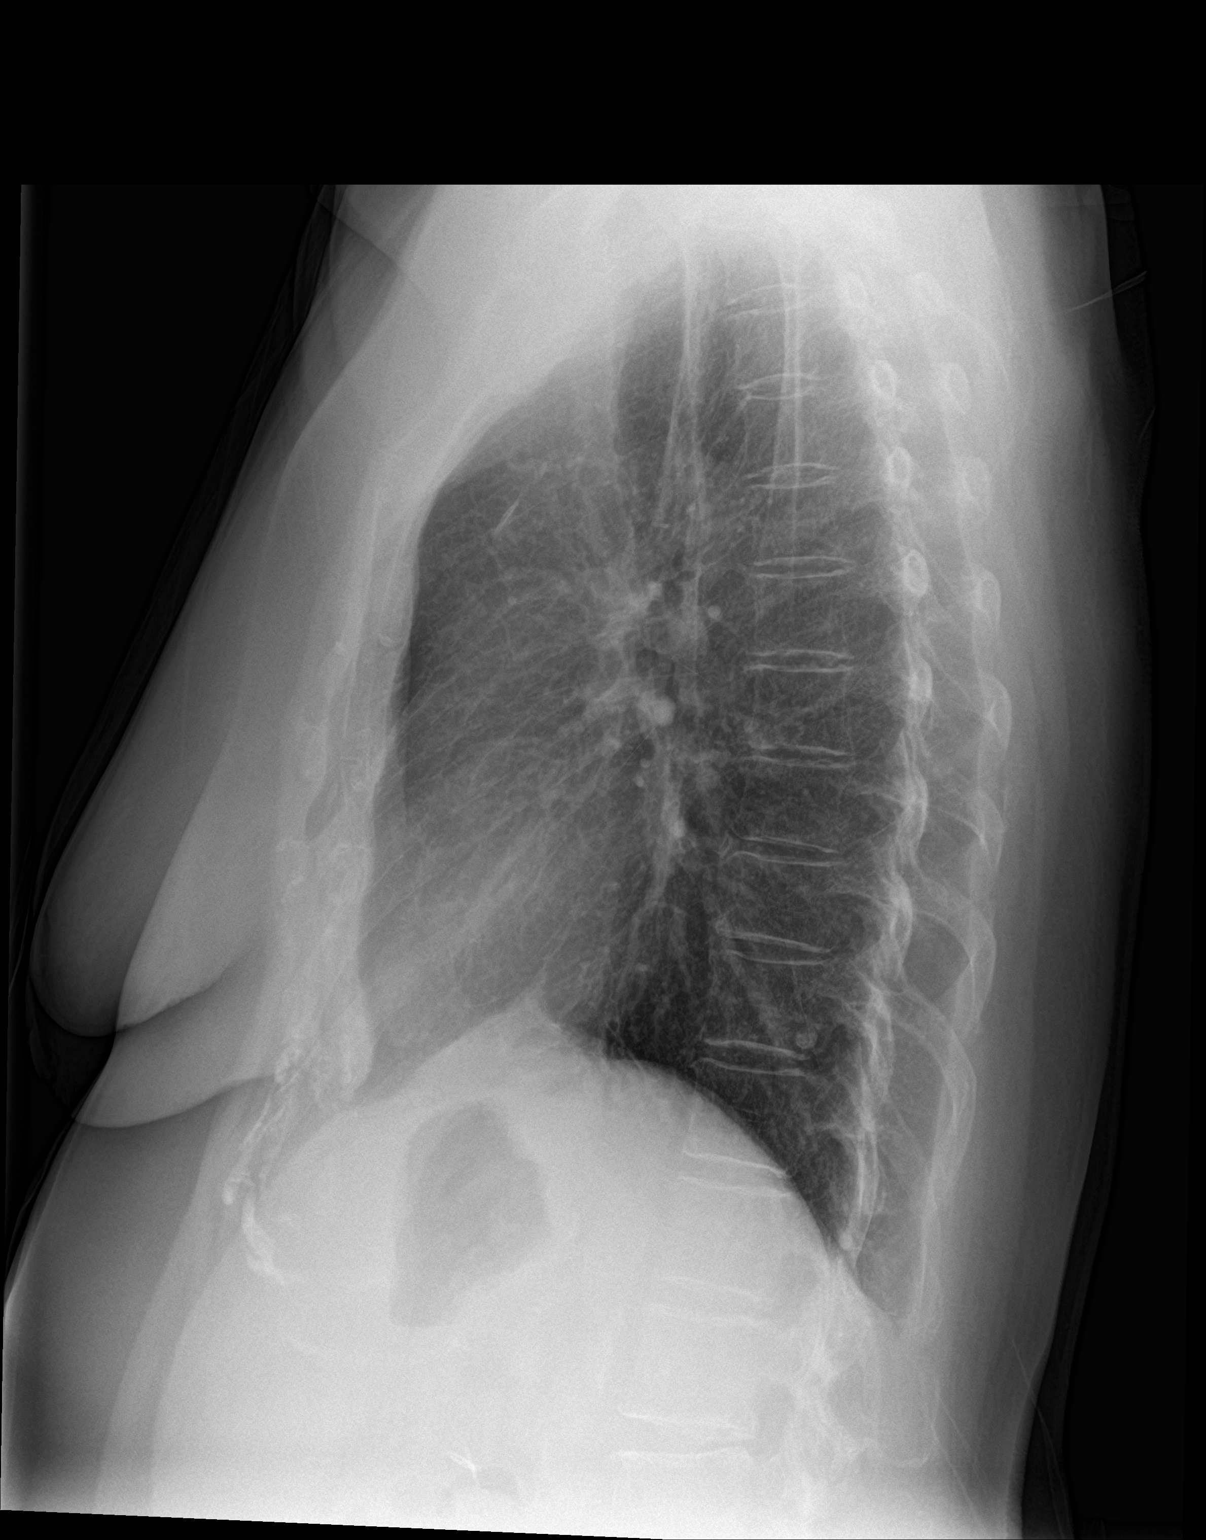

[2 of 2 positions shown; findings below may reference images not displayed]

FINDINGS: The heart size and mediastinal contours are within normal limits.
Aortic atherosclerosis. Both lungs are clear. Small calcified
granuloma again seen in left lower lobe. The visualized skeletal
structures are unremarkable.
IMPRESSION: No active cardiopulmonary disease.

## 2018-11-09 IMAGING — DX DG CHEST 1V PORT
1 series · 1 of 1 positions shown · non-contrast
Comparison: 08/22/2017 and earlier.

CLINICAL DATA: 71-year-old female status post repair of coronary
artery dissection and CABG postop day 0.

EXAM:
PORTABLE CHEST 1 VIEW

[chest]
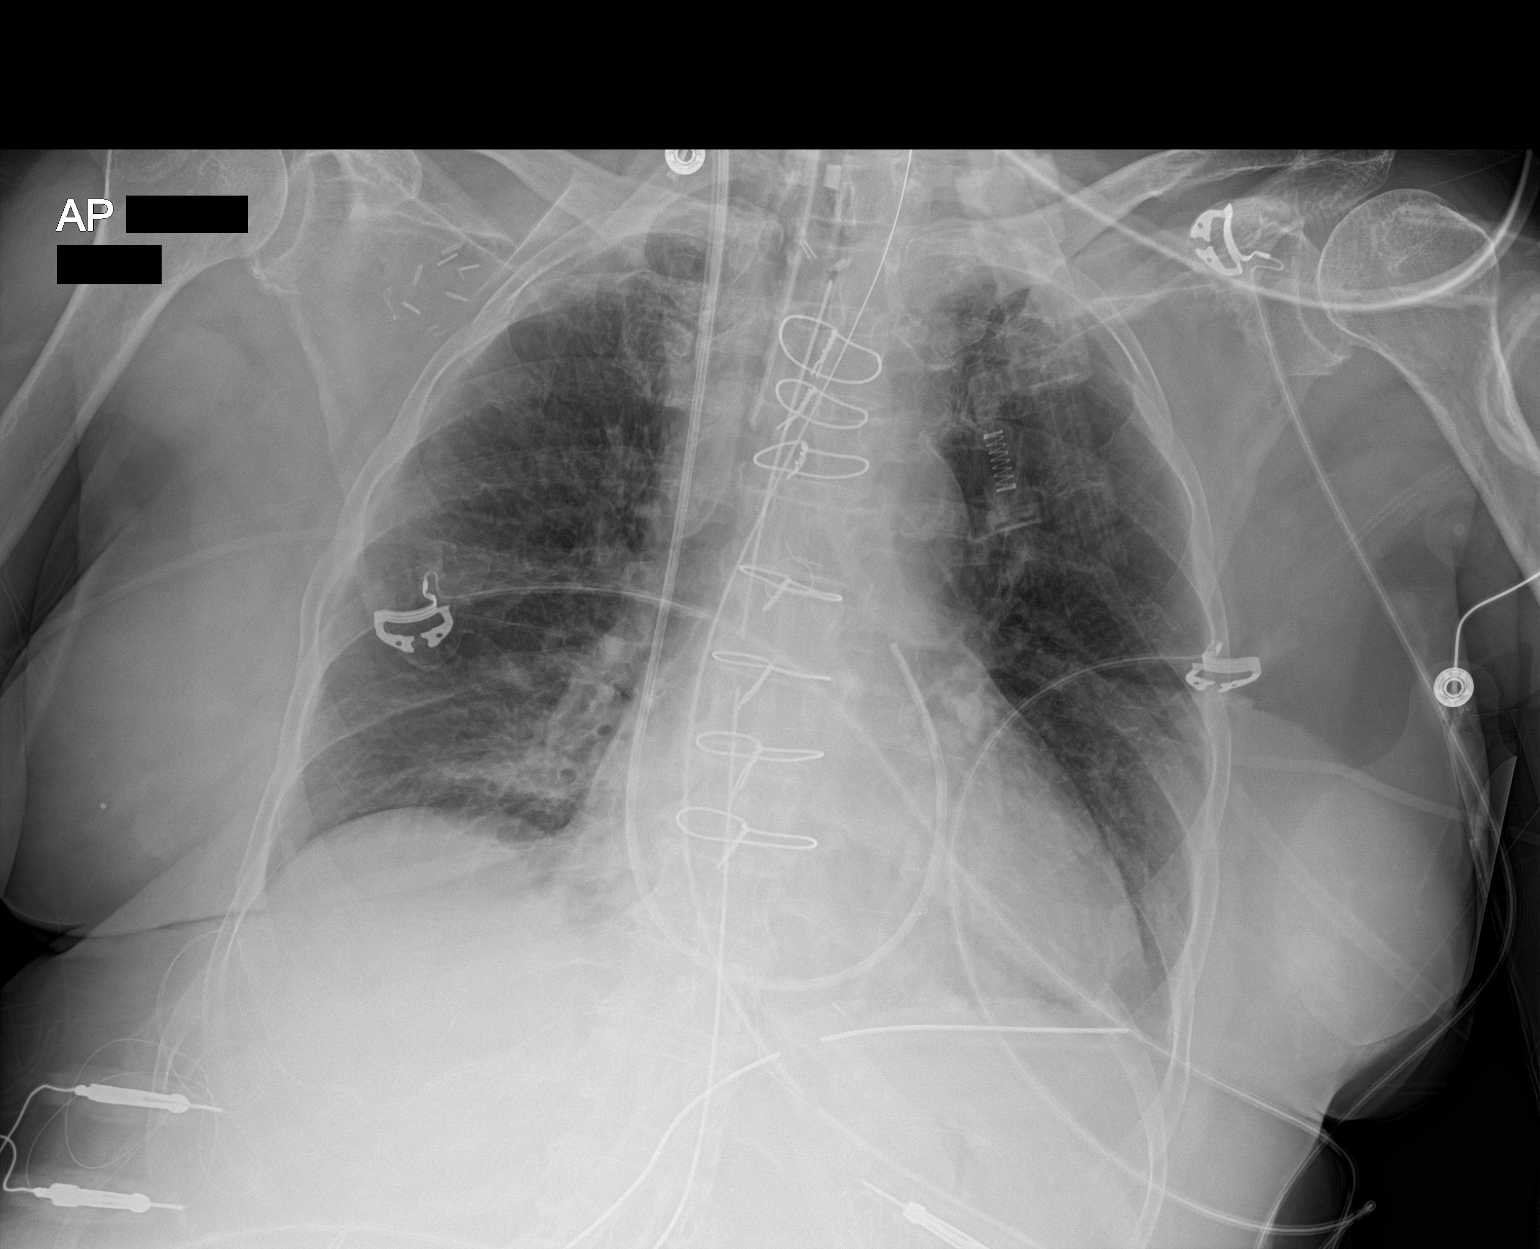

[1 of 1 positions shown; findings below may reference images not displayed]

FINDINGS: Portable AP semi upright view at at 0347 hours. Endotracheal tube
tip in good position between the level of the clavicles and carina.
Enteric tube courses to the abdomen, side hole up the level of the
proximal gastric body. Right IJ approach Swan-Ganz catheter, tip at
the main pulmonary artery level. Left chest tube and mediastinal
tube. Epicardial pacer wires. Sternotomy and CABG changes.

No pneumothorax or pulmonary edema. Mediastinal contours are within
normal limits. Small calcified lymph nodes including at the left
hilum. Mild streaky bibasilar opacity most resembling atelectasis.

Multiple surgical clips projecting over the right scapula are new.
Paucity of bowel gas in the upper abdomen.
IMPRESSION: 1. Lines and tubes appear appropriately placed.
2. No pneumothorax or pulmonary edema.  Mild basilar atelectasis.

## 2018-11-10 IMAGING — DX DG CHEST 1V PORT
1 series · 1 of 1 positions shown · non-contrast
Comparison: 08/28/2017

CLINICAL DATA: Respiratory difficulty

EXAM:
PORTABLE CHEST 1 VIEW

[chest ap]
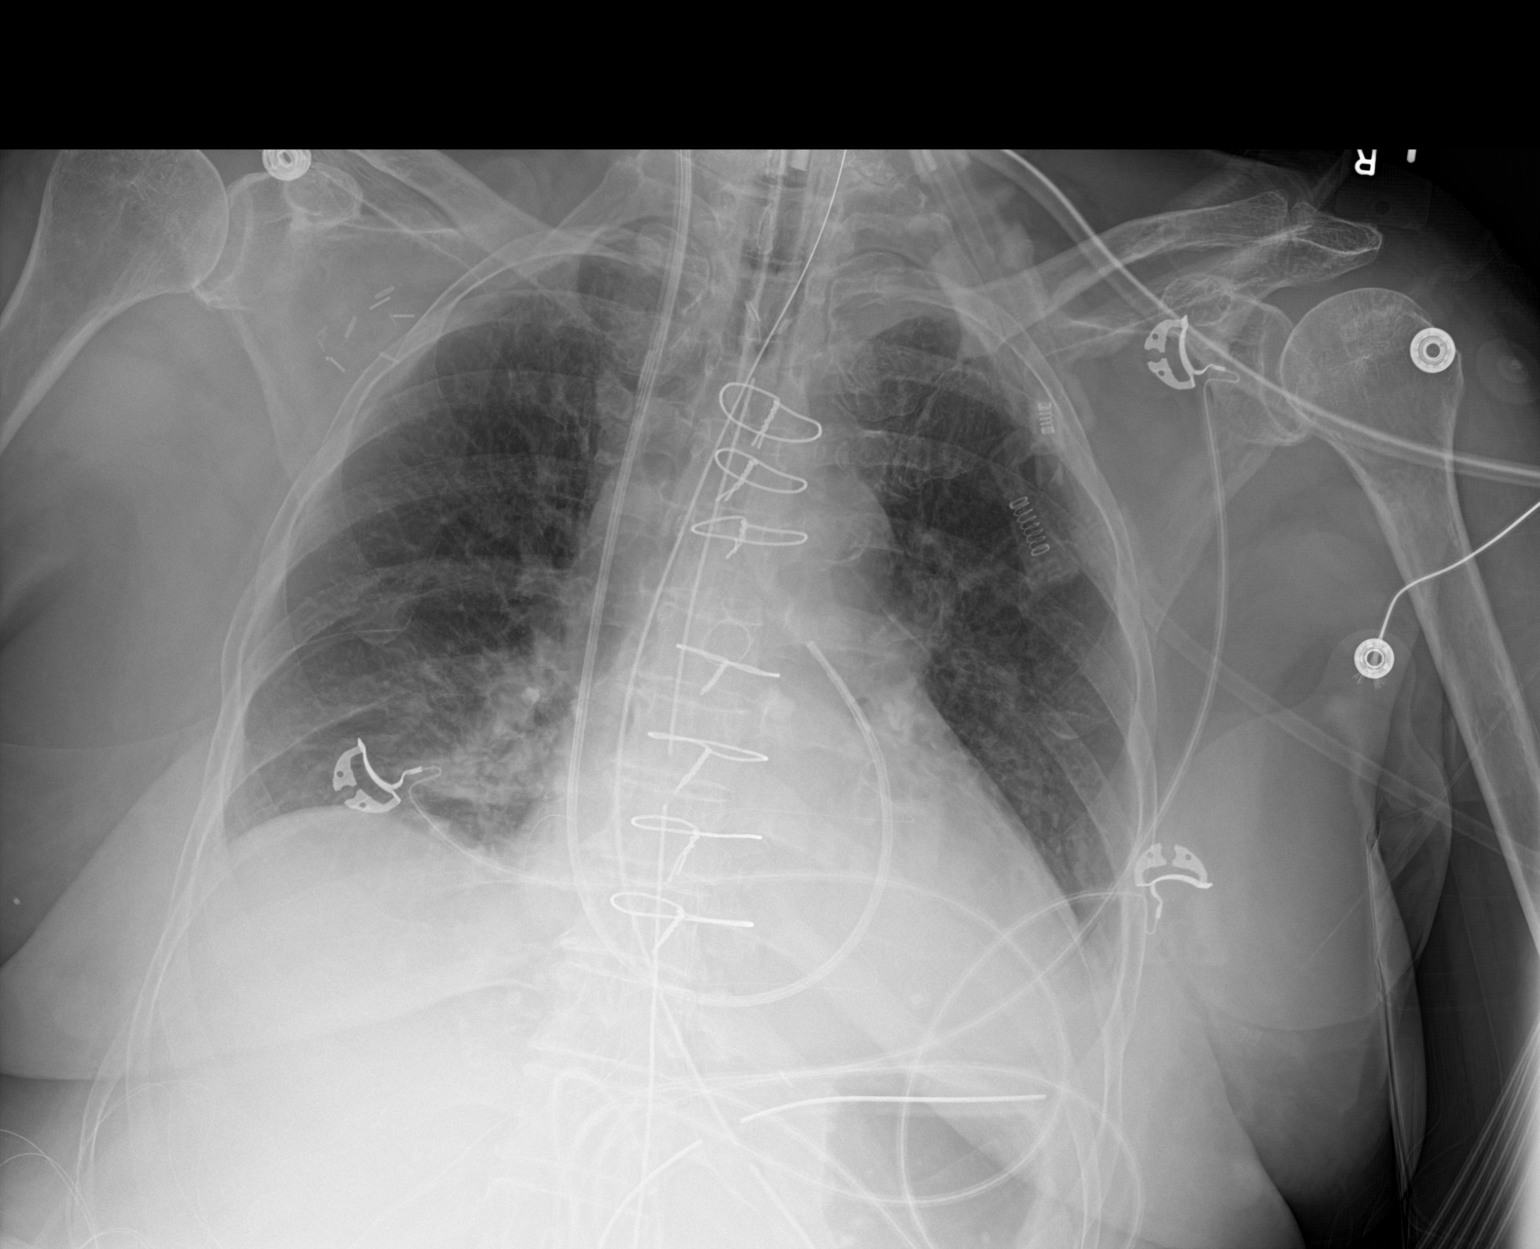

[1 of 1 positions shown; findings below may reference images not displayed]

FINDINGS: Endotracheal tube, NG tube, chest tubes, Swan-Ganz catheter with its
tip in the main pulmonary artery, and percutaneous pacemaker wires
are stable. Diffuse interstitial edema is stable. Cardiomegaly. No
pneumothorax. Low lung volumes and bibasilar atelectasis is worse.
IMPRESSION: Worsening atelectasis at the bases.

Stable interstitial edema.

No pneumothorax.

## 2018-11-14 IMAGING — DX DG ABDOMEN 1V
1 series · 1 of 1 positions shown · non-contrast
Comparison: CT abdomen and pelvis 01/13/2017

CLINICAL DATA: Five days postop CABG. Patient woke up with
abdominal pain.

EXAM:
ABDOMEN - 1 VIEW

[abdomen kub]
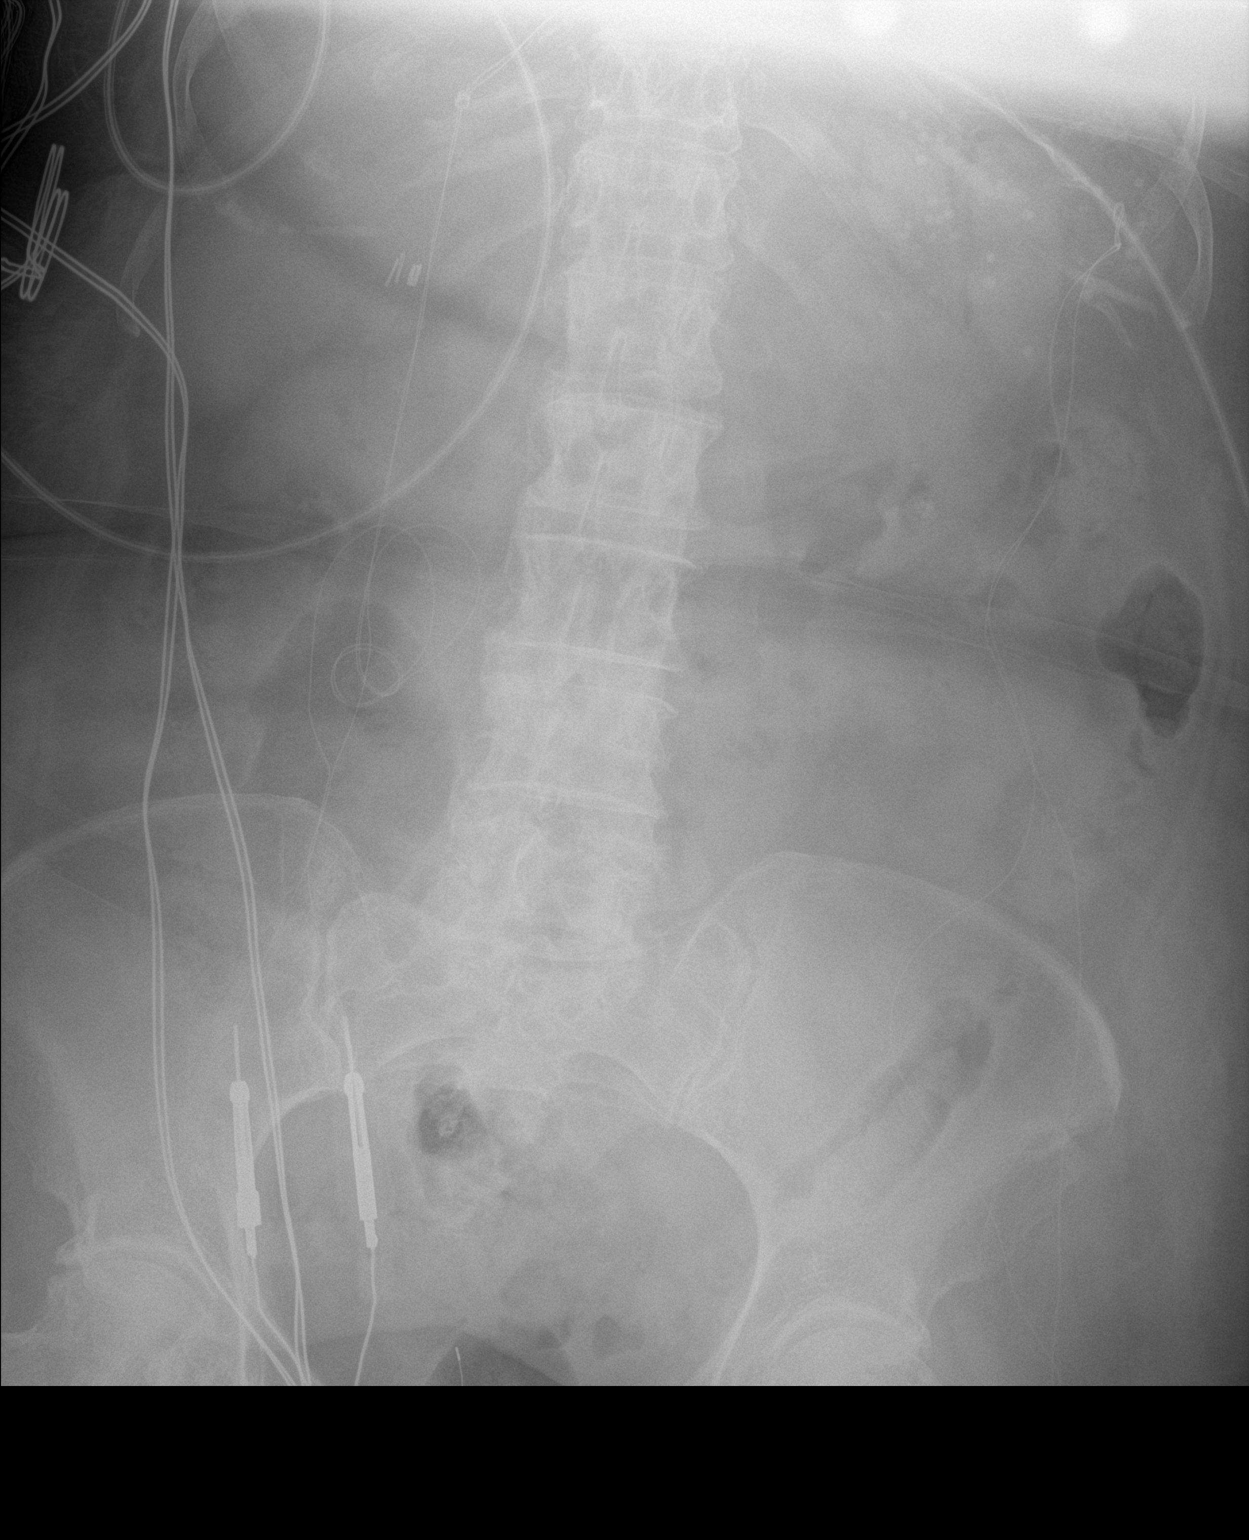

[1 of 1 positions shown; findings below may reference images not displayed]

FINDINGS: Calcified granulomas in the left upper quadrant, likely in the
spleen. No radiopaque stones. Degenerative changes in the lumbar
spine and hips. Scattered gas and stool in the colon. No small or
large bowel distention. Surgical clips in the right upper quadrant.
IMPRESSION: Nonobstructive bowel gas pattern.
